# Patient Record
Sex: Male | Born: 1950
Health system: Southern US, Community
[De-identification: ages and names within clinical notes are randomized; demographics above are authoritative.]

## PROBLEM LIST (undated history)

## (undated) DIAGNOSIS — F329 Major depressive disorder, single episode, unspecified: Secondary | ICD-10-CM

## (undated) DIAGNOSIS — E079 Disorder of thyroid, unspecified: Secondary | ICD-10-CM

## (undated) DIAGNOSIS — I10 Essential (primary) hypertension: Secondary | ICD-10-CM

## (undated) DIAGNOSIS — I2510 Atherosclerotic heart disease of native coronary artery without angina pectoris: Secondary | ICD-10-CM

## (undated) DIAGNOSIS — K429 Umbilical hernia without obstruction or gangrene: Secondary | ICD-10-CM

## (undated) DIAGNOSIS — U071 COVID-19: Secondary | ICD-10-CM

## (undated) DIAGNOSIS — E785 Hyperlipidemia, unspecified: Secondary | ICD-10-CM

## (undated) DIAGNOSIS — F32A Depression, unspecified: Secondary | ICD-10-CM

## (undated) HISTORY — PX: TONSILLECTOMY: SUR1361

## (undated) HISTORY — PX: CATARACT EXTRACTION: SUR2

## (undated) HISTORY — DX: Hyperlipidemia, unspecified: E78.5

## (undated) HISTORY — DX: Umbilical hernia without obstruction or gangrene: K42.9

---

## 2015-04-23 DIAGNOSIS — Z23 Encounter for immunization: Secondary | ICD-10-CM | POA: Diagnosis not present

## 2015-08-27 DIAGNOSIS — Z0000 Encounter for general adult medical examination without abnormal findings: Secondary | ICD-10-CM | POA: Diagnosis not present

## 2015-08-27 DIAGNOSIS — Z125 Encounter for screening for malignant neoplasm of prostate: Secondary | ICD-10-CM | POA: Diagnosis not present

## 2015-08-27 DIAGNOSIS — R7301 Impaired fasting glucose: Secondary | ICD-10-CM | POA: Diagnosis not present

## 2015-08-27 DIAGNOSIS — E039 Hypothyroidism, unspecified: Secondary | ICD-10-CM | POA: Diagnosis not present

## 2015-08-27 DIAGNOSIS — E784 Other hyperlipidemia: Secondary | ICD-10-CM | POA: Diagnosis not present

## 2015-09-03 DIAGNOSIS — Z0000 Encounter for general adult medical examination without abnormal findings: Secondary | ICD-10-CM | POA: Diagnosis not present

## 2015-09-03 DIAGNOSIS — J45909 Unspecified asthma, uncomplicated: Secondary | ICD-10-CM | POA: Diagnosis not present

## 2015-09-03 DIAGNOSIS — Z8371 Family history of colonic polyps: Secondary | ICD-10-CM | POA: Diagnosis not present

## 2015-09-03 DIAGNOSIS — R69 Illness, unspecified: Secondary | ICD-10-CM | POA: Diagnosis not present

## 2015-09-03 DIAGNOSIS — E784 Other hyperlipidemia: Secondary | ICD-10-CM | POA: Diagnosis not present

## 2015-09-03 DIAGNOSIS — E039 Hypothyroidism, unspecified: Secondary | ICD-10-CM | POA: Diagnosis not present

## 2015-09-03 DIAGNOSIS — R5383 Other fatigue: Secondary | ICD-10-CM | POA: Diagnosis not present

## 2015-09-03 DIAGNOSIS — M1990 Unspecified osteoarthritis, unspecified site: Secondary | ICD-10-CM | POA: Diagnosis not present

## 2015-09-03 DIAGNOSIS — I160 Hypertensive urgency: Secondary | ICD-10-CM | POA: Diagnosis not present

## 2015-09-03 DIAGNOSIS — R7301 Impaired fasting glucose: Secondary | ICD-10-CM | POA: Diagnosis not present

## 2015-09-10 DIAGNOSIS — Z1212 Encounter for screening for malignant neoplasm of rectum: Secondary | ICD-10-CM | POA: Diagnosis not present

## 2015-09-11 DIAGNOSIS — Z6830 Body mass index (BMI) 30.0-30.9, adult: Secondary | ICD-10-CM | POA: Diagnosis not present

## 2015-09-11 DIAGNOSIS — I160 Hypertensive urgency: Secondary | ICD-10-CM | POA: Diagnosis not present

## 2015-10-09 DIAGNOSIS — R69 Illness, unspecified: Secondary | ICD-10-CM | POA: Diagnosis not present

## 2015-12-25 DIAGNOSIS — R5383 Other fatigue: Secondary | ICD-10-CM | POA: Diagnosis not present

## 2016-01-01 DIAGNOSIS — R7301 Impaired fasting glucose: Secondary | ICD-10-CM | POA: Diagnosis not present

## 2016-01-01 DIAGNOSIS — I160 Hypertensive urgency: Secondary | ICD-10-CM | POA: Diagnosis not present

## 2016-01-01 DIAGNOSIS — E038 Other specified hypothyroidism: Secondary | ICD-10-CM | POA: Diagnosis not present

## 2016-01-01 DIAGNOSIS — R69 Illness, unspecified: Secondary | ICD-10-CM | POA: Diagnosis not present

## 2016-03-02 DIAGNOSIS — H279 Unspecified disorder of lens: Secondary | ICD-10-CM | POA: Diagnosis not present

## 2016-04-03 DIAGNOSIS — Z23 Encounter for immunization: Secondary | ICD-10-CM | POA: Diagnosis not present

## 2016-08-17 DIAGNOSIS — H25041 Posterior subcapsular polar age-related cataract, right eye: Secondary | ICD-10-CM | POA: Diagnosis not present

## 2016-08-17 DIAGNOSIS — H5203 Hypermetropia, bilateral: Secondary | ICD-10-CM | POA: Diagnosis not present

## 2016-08-17 DIAGNOSIS — H524 Presbyopia: Secondary | ICD-10-CM | POA: Diagnosis not present

## 2016-08-17 DIAGNOSIS — H52223 Regular astigmatism, bilateral: Secondary | ICD-10-CM | POA: Diagnosis not present

## 2016-09-03 DIAGNOSIS — Z0000 Encounter for general adult medical examination without abnormal findings: Secondary | ICD-10-CM | POA: Diagnosis not present

## 2016-09-03 DIAGNOSIS — Z125 Encounter for screening for malignant neoplasm of prostate: Secondary | ICD-10-CM | POA: Diagnosis not present

## 2016-09-03 DIAGNOSIS — E038 Other specified hypothyroidism: Secondary | ICD-10-CM | POA: Diagnosis not present

## 2016-09-03 DIAGNOSIS — E784 Other hyperlipidemia: Secondary | ICD-10-CM | POA: Diagnosis not present

## 2016-09-03 DIAGNOSIS — R7301 Impaired fasting glucose: Secondary | ICD-10-CM | POA: Diagnosis not present

## 2016-09-10 DIAGNOSIS — M1990 Unspecified osteoarthritis, unspecified site: Secondary | ICD-10-CM | POA: Diagnosis not present

## 2016-09-10 DIAGNOSIS — R7301 Impaired fasting glucose: Secondary | ICD-10-CM | POA: Diagnosis not present

## 2016-09-10 DIAGNOSIS — J45909 Unspecified asthma, uncomplicated: Secondary | ICD-10-CM | POA: Diagnosis not present

## 2016-09-10 DIAGNOSIS — I10 Essential (primary) hypertension: Secondary | ICD-10-CM | POA: Diagnosis not present

## 2016-09-10 DIAGNOSIS — Z8371 Family history of colonic polyps: Secondary | ICD-10-CM | POA: Diagnosis not present

## 2016-09-10 DIAGNOSIS — E038 Other specified hypothyroidism: Secondary | ICD-10-CM | POA: Diagnosis not present

## 2016-09-10 DIAGNOSIS — R69 Illness, unspecified: Secondary | ICD-10-CM | POA: Diagnosis not present

## 2016-09-10 DIAGNOSIS — Z6830 Body mass index (BMI) 30.0-30.9, adult: Secondary | ICD-10-CM | POA: Diagnosis not present

## 2016-09-10 DIAGNOSIS — Z0000 Encounter for general adult medical examination without abnormal findings: Secondary | ICD-10-CM | POA: Diagnosis not present

## 2016-09-10 DIAGNOSIS — E784 Other hyperlipidemia: Secondary | ICD-10-CM | POA: Diagnosis not present

## 2016-09-14 DIAGNOSIS — Z1212 Encounter for screening for malignant neoplasm of rectum: Secondary | ICD-10-CM | POA: Diagnosis not present

## 2016-10-19 DIAGNOSIS — H02839 Dermatochalasis of unspecified eye, unspecified eyelid: Secondary | ICD-10-CM | POA: Diagnosis not present

## 2016-10-19 DIAGNOSIS — H25013 Cortical age-related cataract, bilateral: Secondary | ICD-10-CM | POA: Diagnosis not present

## 2016-10-19 DIAGNOSIS — H2513 Age-related nuclear cataract, bilateral: Secondary | ICD-10-CM | POA: Diagnosis not present

## 2016-10-19 DIAGNOSIS — H2511 Age-related nuclear cataract, right eye: Secondary | ICD-10-CM | POA: Diagnosis not present

## 2016-10-19 DIAGNOSIS — H25043 Posterior subcapsular polar age-related cataract, bilateral: Secondary | ICD-10-CM | POA: Diagnosis not present

## 2016-12-06 DIAGNOSIS — H2511 Age-related nuclear cataract, right eye: Secondary | ICD-10-CM | POA: Diagnosis not present

## 2016-12-07 DIAGNOSIS — H2512 Age-related nuclear cataract, left eye: Secondary | ICD-10-CM | POA: Diagnosis not present

## 2016-12-14 DIAGNOSIS — I10 Essential (primary) hypertension: Secondary | ICD-10-CM | POA: Diagnosis not present

## 2016-12-14 DIAGNOSIS — E038 Other specified hypothyroidism: Secondary | ICD-10-CM | POA: Diagnosis not present

## 2016-12-14 DIAGNOSIS — Z6831 Body mass index (BMI) 31.0-31.9, adult: Secondary | ICD-10-CM | POA: Diagnosis not present

## 2016-12-16 DIAGNOSIS — R69 Illness, unspecified: Secondary | ICD-10-CM | POA: Diagnosis not present

## 2016-12-27 DIAGNOSIS — H2512 Age-related nuclear cataract, left eye: Secondary | ICD-10-CM | POA: Diagnosis not present

## 2017-02-01 DIAGNOSIS — Z961 Presence of intraocular lens: Secondary | ICD-10-CM | POA: Diagnosis not present

## 2017-02-01 DIAGNOSIS — Z0101 Encounter for examination of eyes and vision with abnormal findings: Secondary | ICD-10-CM | POA: Diagnosis not present

## 2017-03-19 DIAGNOSIS — Z23 Encounter for immunization: Secondary | ICD-10-CM | POA: Diagnosis not present

## 2017-06-21 DIAGNOSIS — R69 Illness, unspecified: Secondary | ICD-10-CM | POA: Diagnosis not present

## 2017-08-02 DIAGNOSIS — H18413 Arcus senilis, bilateral: Secondary | ICD-10-CM | POA: Diagnosis not present

## 2017-08-02 DIAGNOSIS — Z961 Presence of intraocular lens: Secondary | ICD-10-CM | POA: Diagnosis not present

## 2017-08-02 DIAGNOSIS — I10 Essential (primary) hypertension: Secondary | ICD-10-CM | POA: Diagnosis not present

## 2017-08-02 DIAGNOSIS — H02839 Dermatochalasis of unspecified eye, unspecified eyelid: Secondary | ICD-10-CM | POA: Diagnosis not present

## 2017-09-19 DIAGNOSIS — Z125 Encounter for screening for malignant neoplasm of prostate: Secondary | ICD-10-CM | POA: Diagnosis not present

## 2017-09-19 DIAGNOSIS — R7301 Impaired fasting glucose: Secondary | ICD-10-CM | POA: Diagnosis not present

## 2017-09-19 DIAGNOSIS — E038 Other specified hypothyroidism: Secondary | ICD-10-CM | POA: Diagnosis not present

## 2017-09-19 DIAGNOSIS — E7849 Other hyperlipidemia: Secondary | ICD-10-CM | POA: Diagnosis not present

## 2017-09-19 DIAGNOSIS — I10 Essential (primary) hypertension: Secondary | ICD-10-CM | POA: Diagnosis not present

## 2017-09-19 DIAGNOSIS — R82998 Other abnormal findings in urine: Secondary | ICD-10-CM | POA: Diagnosis not present

## 2017-09-26 DIAGNOSIS — M1990 Unspecified osteoarthritis, unspecified site: Secondary | ICD-10-CM | POA: Diagnosis not present

## 2017-09-26 DIAGNOSIS — I10 Essential (primary) hypertension: Secondary | ICD-10-CM | POA: Diagnosis not present

## 2017-09-26 DIAGNOSIS — E038 Other specified hypothyroidism: Secondary | ICD-10-CM | POA: Diagnosis not present

## 2017-09-26 DIAGNOSIS — Z0000 Encounter for general adult medical examination without abnormal findings: Secondary | ICD-10-CM | POA: Diagnosis not present

## 2017-09-26 DIAGNOSIS — N183 Chronic kidney disease, stage 3 (moderate): Secondary | ICD-10-CM | POA: Diagnosis not present

## 2017-09-26 DIAGNOSIS — R69 Illness, unspecified: Secondary | ICD-10-CM | POA: Diagnosis not present

## 2017-09-26 DIAGNOSIS — E7849 Other hyperlipidemia: Secondary | ICD-10-CM | POA: Diagnosis not present

## 2017-09-26 DIAGNOSIS — M62830 Muscle spasm of back: Secondary | ICD-10-CM | POA: Diagnosis not present

## 2017-09-26 DIAGNOSIS — Z8371 Family history of colonic polyps: Secondary | ICD-10-CM | POA: Diagnosis not present

## 2017-09-26 DIAGNOSIS — R7301 Impaired fasting glucose: Secondary | ICD-10-CM | POA: Diagnosis not present

## 2017-10-03 DIAGNOSIS — Z1212 Encounter for screening for malignant neoplasm of rectum: Secondary | ICD-10-CM | POA: Diagnosis not present

## 2017-11-01 DIAGNOSIS — Z6830 Body mass index (BMI) 30.0-30.9, adult: Secondary | ICD-10-CM | POA: Diagnosis not present

## 2017-11-01 DIAGNOSIS — E039 Hypothyroidism, unspecified: Secondary | ICD-10-CM | POA: Diagnosis not present

## 2017-11-01 DIAGNOSIS — I10 Essential (primary) hypertension: Secondary | ICD-10-CM | POA: Diagnosis not present

## 2017-11-01 DIAGNOSIS — N183 Chronic kidney disease, stage 3 (moderate): Secondary | ICD-10-CM | POA: Diagnosis not present

## 2017-11-18 ENCOUNTER — Emergency Department
Admission: EM | Admit: 2017-11-18 | Discharge: 2017-11-18 | Disposition: A | Discharge status: Home / Self Care | Payer: Medicare HMO | Source: Home / Self Care | Attending: Family Medicine | Admitting: Family Medicine

## 2017-11-18 ENCOUNTER — Encounter: Payer: Self-pay | Admitting: *Deleted

## 2017-11-18 ENCOUNTER — Other Ambulatory Visit: Payer: Self-pay

## 2017-11-18 DIAGNOSIS — S30863A Insect bite (nonvenomous) of scrotum and testes, initial encounter: Secondary | ICD-10-CM | POA: Diagnosis not present

## 2017-11-18 DIAGNOSIS — W57XXXA Bitten or stung by nonvenomous insect and other nonvenomous arthropods, initial encounter: Secondary | ICD-10-CM

## 2017-11-18 DIAGNOSIS — R21 Rash and other nonspecific skin eruption: Secondary | ICD-10-CM

## 2017-11-18 HISTORY — DX: Essential (primary) hypertension: I10

## 2017-11-18 HISTORY — DX: Disorder of thyroid, unspecified: E07.9

## 2017-11-18 HISTORY — DX: Depression, unspecified: F32.A

## 2017-11-18 HISTORY — DX: Major depressive disorder, single episode, unspecified: F32.9

## 2017-11-18 MED ORDER — TRIAMCINOLONE ACETONIDE 0.1 % EX CREA: 1.0000 "application " | TOPICAL_CREAM | Freq: Two times a day (BID) | CUTANEOUS | 0 refills | Status: DC

## 2017-11-18 MED ORDER — DOXYCYCLINE HYCLATE 100 MG PO CAPS: 100.0000 mg | ORAL_CAPSULE | Freq: Two times a day (BID) | ORAL | 0 refills | Status: AC

## 2017-11-18 NOTE — Discharge Instructions (Signed)
°  Please take the antibiotic as prescribed.  If you are not improving in 1 week, please be reevaluated by your family medicine provider.  You may use the triamcinolone cream on your arm and side to help with itching but I would avoid putting it on your scrotum due to the tissue being so delicate.

## 2017-11-18 NOTE — ED Provider Notes (Signed)
[name]    CSN: [id number] Arrival date & time: 11/18/17  1119     History   Chief Complaint Chief Complaint  Patient presents with  . Insect Bite    HPI Bryan Thompson is a 67 y.o. male.   HPI  Bryan Thompson is a 67 y.o. male presenting to UC with wife c/o right sided scrotal pain, swelling and redness that he noticed last night after removing a deer tick from his scrotum. He believes the tick was there for 24-48 hours. He works outside a lot and initially noticed a red itchy rash to his Left side chest wall and then Left forearm about 2 days ago.  He initially thought the scrotal symptoms were due to the rash spreading but then found the tick.  Denies fever, chills, n/v/d, HA, or joint pain but wife encourages pt to start doxycycline for tick illness.     Past Medical History:  Diagnosis Date  . Depression   . Hypertension   . Thyroid disease     There are no active problems to display for this patient.   Past Surgical History:  Procedure Laterality Date  . CATARACT EXTRACTION    . TONSILLECTOMY         Home Medications    Prior to Admission medications   Medication Sig Start Date End Date Taking? Authorizing Provider  irbesartan (AVAPRO) 150 MG tablet Take 150 mg by mouth daily.   Yes [provider]  levothyroxine (SYNTHROID, LEVOTHROID) 150 MCG tablet Take 150 mcg by mouth daily before breakfast.   Yes [provider]  Multiple Vitamins-Minerals (CENTRUM SILVER PO) Take by mouth.   Yes [provider]  sertraline (ZOLOFT) 50 MG tablet Take 50 mg by mouth daily.   Yes [provider]  vitamin B-12 (CYANOCOBALAMIN) 1000 MCG tablet Take 1,000 mcg by mouth daily.   Yes [provider]  doxycycline (VIBRAMYCIN) 100 MG capsule Take 1 capsule (100 mg total) by mouth 2 (two) times daily for 14 days. 11/18/17 12/02/17  Noe Gens, PA-C  triamcinolone cream (KENALOG) 0.1 % Apply 1 application topically 2  (two) times daily. To rash on arm and side as needed for itching 11/18/17   Noe Gens, PA-C    Family History Family History  Problem Relation Age of Onset  . Cancer Mother        breast CA  . Heart disease Mother   . Hypertension Father   . Cancer Father        colon CA    Social History Social History   Tobacco Use  . Smoking status: Never Smoker  . Smokeless tobacco: Never Used  Substance Use Topics  . Alcohol use: Not Currently    Frequency: Never  . Drug use: Never     Allergies   Fish allergy and Prednisone   Review of Systems Review of Systems  Constitutional: Negative for chills and fever.  Gastrointestinal: Negative for diarrhea, nausea and vomiting.  Genitourinary: Positive for scrotal swelling. Negative for discharge, penile pain and penile swelling.  Musculoskeletal: Negative for arthralgias, joint swelling and myalgias.  Skin: Positive for color change and rash. Negative for wound.     Physical Exam Triage Vital Signs ED Triage Vitals [11/18/17 1137]  Enc Vitals Group     BP 124/70     Pulse Rate 71     Resp 16     Temp 97.6 F (36.4 C)     Temp Source  Oral     SpO2 98 %     Weight 218 lb (98.9 kg)     Height      Head Circumference      Peak Flow      Pain Score 5     Pain Loc      Pain Edu?      Excl. in [place]?    No data found.  Updated Vital Signs BP 124/70 (BP Location: Right Arm)   Pulse 71   Temp 97.6 F (36.4 C) (Oral)   Resp 16   Wt 218 lb (98.9 kg)   SpO2 98%   Visual Acuity Right Eye Distance:   Left Eye Distance:   Bilateral Distance:    Right Eye Near:   Left Eye Near:    Bilateral Near:     Physical Exam  Constitutional: He is oriented to person, place, and time. He appears well-developed and well-nourished. No distress.  HENT:  Head: Normocephalic and atraumatic.  Mouth/Throat: Oropharynx is clear and moist.  Eyes: EOM are normal.  Neck: Normal range of motion.  Cardiovascular: Normal rate.   Pulses:      Radial pulses are 2+ on the right side, and 2+ on the left side.  Pulmonary/Chest: Effort normal. No respiratory distress.  Musculoskeletal: Normal range of motion.  Neurological: He is alert and oriented to person, place, and time.  Skin: Skin is warm and dry. Capillary refill takes less than 2 seconds. Rash noted. He is not diaphoretic. There is erythema.     Left side chest wall and Left forearm: erythematous papular rash. Non-tender. No bleeding or discharge. Right side scrotum and groin: erythema, mild edema, mild tenderness. Pinpoint scab on Right side of scrotum- area of reported tick removal.  Psychiatric: He has a normal mood and affect. His behavior is normal.  Nursing note and vitals reviewed.    UC Treatments / Results  Labs (all labs ordered are listed, but only abnormal results are displayed) Labs Reviewed - No data to display  EKG None  Radiology No results found.  Procedures Procedures (including critical care time)  Medications Ordered in UC Medications - No data to display  Initial Impression / Assessment and Plan / UC Course  I have reviewed the triage vital signs and the nursing notes.  Pertinent labs & imaging results that were available during my care of the patient were reviewed by me and considered in my medical decision making (see chart for details).     Rash on Left side and Left arm most c/w contact dermatitis, likely from working outside. Erythema, swelling and tenderness on Right side scrotum and groin concerning for early cellulitis. Will start on doxycycline 100mg  BID for 2 weeks  Home care instructions provided.   Final Clinical Impressions(s) / UC Diagnoses   Final diagnoses:  Tick bite of scrotum, initial encounter  Rash and nonspecific skin eruption     Discharge Instructions      Please take the antibiotic as prescribed.  If you are not improving in 1 week, please be reevaluated by your family medicine  provider.  You may use the triamcinolone cream on your arm and side to help with itching but I would avoid putting it on your scrotum due to the tissue being so delicate.     ED Prescriptions    Medication Sig Dispense Auth. Provider   doxycycline (VIBRAMYCIN) 100 MG capsule Take 1 capsule (100 mg total) by mouth 2 (two) times daily for  14 days. 28 capsule Leeroy Cha O, PA-C   triamcinolone cream (KENALOG) 0.1 % Apply 1 application topically 2 (two) times daily. To rash on arm and side as needed for itching 30 g Noe Gens, PA-C     Controlled Substance Prescriptions [place] Controlled Substance Registry consulted? Not Applicable   Tyrell Antonio 11/18/17 1418

## 2017-11-18 NOTE — ED Triage Notes (Signed)
Patient c/o pulling a tick off of his scrotum lat night that he believes was there for 24-48 hours. C/o lump at the site, swelling of the scrotum am that travels up his right groin. Also has a rash to his LFA and left side/ribs. Reports he feels well, no aches, nausea HA, etc.

## 2017-11-20 ENCOUNTER — Telehealth: Payer: Self-pay

## 2017-11-20 NOTE — Telephone Encounter (Signed)
Feeling better.  Will follow up as needed.

## 2017-12-20 DIAGNOSIS — R69 Illness, unspecified: Secondary | ICD-10-CM | POA: Diagnosis not present

## 2018-03-28 DIAGNOSIS — Z6831 Body mass index (BMI) 31.0-31.9, adult: Secondary | ICD-10-CM | POA: Diagnosis not present

## 2018-03-28 DIAGNOSIS — I10 Essential (primary) hypertension: Secondary | ICD-10-CM | POA: Diagnosis not present

## 2018-03-28 DIAGNOSIS — N183 Chronic kidney disease, stage 3 (moderate): Secondary | ICD-10-CM | POA: Diagnosis not present

## 2018-03-28 DIAGNOSIS — Z23 Encounter for immunization: Secondary | ICD-10-CM | POA: Diagnosis not present

## 2018-03-28 DIAGNOSIS — J45909 Unspecified asthma, uncomplicated: Secondary | ICD-10-CM | POA: Diagnosis not present

## 2018-03-28 DIAGNOSIS — R69 Illness, unspecified: Secondary | ICD-10-CM | POA: Diagnosis not present

## 2018-03-28 DIAGNOSIS — E038 Other specified hypothyroidism: Secondary | ICD-10-CM | POA: Diagnosis not present

## 2018-03-28 DIAGNOSIS — R7301 Impaired fasting glucose: Secondary | ICD-10-CM | POA: Diagnosis not present

## 2018-05-31 DIAGNOSIS — N183 Chronic kidney disease, stage 3 (moderate): Secondary | ICD-10-CM | POA: Diagnosis not present

## 2018-05-31 DIAGNOSIS — I10 Essential (primary) hypertension: Secondary | ICD-10-CM | POA: Diagnosis not present

## 2018-05-31 DIAGNOSIS — Z6830 Body mass index (BMI) 30.0-30.9, adult: Secondary | ICD-10-CM | POA: Diagnosis not present

## 2018-06-28 DIAGNOSIS — R69 Illness, unspecified: Secondary | ICD-10-CM | POA: Diagnosis not present

## 2018-07-12 DIAGNOSIS — I10 Essential (primary) hypertension: Secondary | ICD-10-CM | POA: Diagnosis not present

## 2018-07-12 DIAGNOSIS — N183 Chronic kidney disease, stage 3 (moderate): Secondary | ICD-10-CM | POA: Diagnosis not present

## 2018-07-12 DIAGNOSIS — Z6831 Body mass index (BMI) 31.0-31.9, adult: Secondary | ICD-10-CM | POA: Diagnosis not present

## 2018-09-27 DIAGNOSIS — E038 Other specified hypothyroidism: Secondary | ICD-10-CM | POA: Diagnosis not present

## 2018-09-27 DIAGNOSIS — R7301 Impaired fasting glucose: Secondary | ICD-10-CM | POA: Diagnosis not present

## 2018-09-27 DIAGNOSIS — I10 Essential (primary) hypertension: Secondary | ICD-10-CM | POA: Diagnosis not present

## 2018-09-27 DIAGNOSIS — E7849 Other hyperlipidemia: Secondary | ICD-10-CM | POA: Diagnosis not present

## 2018-09-27 DIAGNOSIS — R82998 Other abnormal findings in urine: Secondary | ICD-10-CM | POA: Diagnosis not present

## 2018-10-04 DIAGNOSIS — Z0000 Encounter for general adult medical examination without abnormal findings: Secondary | ICD-10-CM | POA: Diagnosis not present

## 2018-10-04 DIAGNOSIS — J45909 Unspecified asthma, uncomplicated: Secondary | ICD-10-CM | POA: Diagnosis not present

## 2018-10-04 DIAGNOSIS — R69 Illness, unspecified: Secondary | ICD-10-CM | POA: Diagnosis not present

## 2018-10-04 DIAGNOSIS — E039 Hypothyroidism, unspecified: Secondary | ICD-10-CM | POA: Diagnosis not present

## 2018-10-04 DIAGNOSIS — E785 Hyperlipidemia, unspecified: Secondary | ICD-10-CM | POA: Diagnosis not present

## 2018-10-04 DIAGNOSIS — M1990 Unspecified osteoarthritis, unspecified site: Secondary | ICD-10-CM | POA: Diagnosis not present

## 2018-10-04 DIAGNOSIS — Z1331 Encounter for screening for depression: Secondary | ICD-10-CM | POA: Diagnosis not present

## 2018-10-04 DIAGNOSIS — I129 Hypertensive chronic kidney disease with stage 1 through stage 4 chronic kidney disease, or unspecified chronic kidney disease: Secondary | ICD-10-CM | POA: Diagnosis not present

## 2018-10-04 DIAGNOSIS — R7301 Impaired fasting glucose: Secondary | ICD-10-CM | POA: Diagnosis not present

## 2018-10-04 DIAGNOSIS — N183 Chronic kidney disease, stage 3 (moderate): Secondary | ICD-10-CM | POA: Diagnosis not present

## 2018-11-25 ENCOUNTER — Other Ambulatory Visit: Payer: Self-pay

## 2018-11-25 ENCOUNTER — Emergency Department
Admission: EM | Admit: 2018-11-25 | Discharge: 2018-11-25 | Disposition: A | Discharge status: Home / Self Care | Payer: Medicare HMO | Source: Home / Self Care | Attending: Family Medicine | Admitting: Family Medicine

## 2018-11-25 DIAGNOSIS — B9789 Other viral agents as the cause of diseases classified elsewhere: Secondary | ICD-10-CM

## 2018-11-25 DIAGNOSIS — R21 Rash and other nonspecific skin eruption: Secondary | ICD-10-CM

## 2018-11-25 DIAGNOSIS — J069 Acute upper respiratory infection, unspecified: Secondary | ICD-10-CM

## 2018-11-25 DIAGNOSIS — W57XXXS Bitten or stung by nonvenomous insect and other nonvenomous arthropods, sequela: Secondary | ICD-10-CM | POA: Diagnosis not present

## 2018-11-25 MED ORDER — DOXYCYCLINE HYCLATE 100 MG PO CAPS: 100.0000 mg | ORAL_CAPSULE | Freq: Two times a day (BID) | ORAL | 0 refills | Status: DC

## 2018-11-25 NOTE — Discharge Instructions (Addendum)
Take plain guaifenesin (1200mg  extended release tabs such as Mucinex) twice daily, with plenty of water, for cough and congestion.  Get adequate rest.   May use Afrin nasal spray (or generic oxymetazoline) each morning for about 5 days and then discontinue.  Also recommend using saline nasal spray several times daily and saline nasal irrigation (AYR is a common brand).  Use Flonase nasal spray each morning after using Afrin nasal spray and saline nasal irrigation. Try warm salt water gargles for sore throat.  Stop all antihistamines for now, and other non-prescription cough/cold preparations. May take Delsym Cough Suppressant at bedtime for nighttime cough.  May use albuterol inhaler as prescribed.   If symptoms become significantly worse during the night or over the weekend, proceed to the local emergency room.

## 2018-11-25 NOTE — ED Provider Notes (Signed)
[name]    CSN: [id number] Arrival date & time: 11/25/18  3244     History   Chief Complaint Chief Complaint  Patient presents with  . Rash  . Insect Bite    tick    HPI Darcell Sabino is a 68 y.o. male.   Patient presents with two complaints: 1)  He had several tick bites in late May and early June.  The last tick that he removed (not engorged) was a [place] star tick.  Since then he has had an intermittent rash, now resolved, and headache/myalgias. 2)  Six days ago he developed a productive cough, increased headache, nasal congestion, mild sore throat, fatigue and myalgias.  He denies pleuritic pain or shortness of breath.  He denies recent changes in taste or smell. He has a past history of asthma and pneumonia.  The history is provided by the patient and the spouse.    Past Medical History:  Diagnosis Date  . Depression   . Hypertension   . Thyroid disease     Active problems:  Hypothyroid, hypertension, and depression   Past Surgical History:  Procedure Laterality Date  . CATARACT EXTRACTION    . TONSILLECTOMY         Home Medications    Prior to Admission medications   Medication Sig Start Date End Date Taking? Authorizing Provider  doxycycline (VIBRAMYCIN) 100 MG capsule Take 1 capsule (100 mg total) by mouth 2 (two) times daily. Take with food. 11/25/18   Kandra Nicolas, MD  irbesartan (AVAPRO) 150 MG tablet Take 150 mg by mouth daily.    [provider]  levothyroxine (SYNTHROID, LEVOTHROID) 150 MCG tablet Take 150 mcg by mouth daily before breakfast.    [provider]  Multiple Vitamins-Minerals (CENTRUM SILVER PO) Take by mouth.    [provider]  sertraline (ZOLOFT) 50 MG tablet Take 50 mg by mouth daily.    [provider]  triamcinolone cream (KENALOG) 0.1 % Apply 1 application topically 2 (two) times daily. To rash on arm and side as needed for itching 11/18/17   Noe Gens, PA-C  vitamin B-12  (CYANOCOBALAMIN) 1000 MCG tablet Take 1,000 mcg by mouth daily.    [provider]    Family History Family History  Problem Relation Age of Onset  . Cancer Mother        breast CA  . Heart disease Mother   . Hypertension Father   . Cancer Father        colon CA    Social History Social History   Tobacco Use  . Smoking status: Never Smoker  . Smokeless tobacco: Never Used  Substance Use Topics  . Alcohol use: Not Currently    Frequency: Never  . Drug use: Never     Allergies   Fish allergy and Prednisone   Review of Systems Review of Systems + sore throat + cough No pleuritic pain No wheezing + nasal congestion + post-nasal drainage No sinus pain/pressure No itchy/red eyes No earache No hemoptysis No SOB No fever/chills No nausea No vomiting No abdominal pain No diarrhea No urinary symptoms No skin rash + fatigue + myalgias + headache Used OTC meds without relief   Physical Exam Triage Vital Signs ED Triage Vitals  Enc Vitals Group     BP 11/25/18 1006 131/77     Pulse Rate 11/25/18 1006 86     Resp 11/25/18 1006 18     Temp 11/25/18 1006  97.6 F (36.4 C)     Temp Source 11/25/18 1006 Oral     SpO2 11/25/18 1006 97 %     Weight 11/25/18 1007 222 lb (100.7 kg)     Height 11/25/18 1007 5\' 10"  (1.778 m)     Head Circumference --      Peak Flow --      Pain Score 11/25/18 1007 0     Pain Loc --      Pain Edu? --      Excl. in [place]? --    No data found.  Updated Vital Signs BP 131/77 (BP Location: Right Arm)   Pulse 86   Temp 97.6 F (36.4 C) (Oral)   Resp 18   Ht 5\' 10"  (1.778 m)   Wt 100.7 kg   SpO2 97%   BMI 31.85 kg/m   Visual Acuity Right Eye Distance:   Left Eye Distance:   Bilateral Distance:    Right Eye Near:   Left Eye Near:    Bilateral Near:     Physical Exam Nursing notes and Vital Signs reviewed. Appearance:  Patient appears stated age, and in no acute distress Eyes:  Pupils are equal, round, and  reactive to light and accomodation.  Extraocular movement is intact.  Conjunctivae are not inflamed  Ears:  Canals normal.  Tympanic membranes normal.  Nose:  Mildly congested turbinates.  No sinus tenderness. Pharynx:  Normal Neck:  Supple.  Enlarged posterior/lateral nodes are palpated bilaterally, tender to palpation on the left.   Lungs:  Clear to auscultation.  Breath sounds are equal.  Moving air well. Heart:  Regular rate and rhythm without murmurs, rubs, or gallops.  Abdomen:  Nontender without masses or hepatosplenomegaly.  Bowel sounds are present.  No CVA or flank tenderness.  Extremities:  No edema.  Skin:  No rash present.    UC Treatments / Results  Labs (all labs ordered are listed, but only abnormal results are displayed) [place] MTN SPOTTED FVR ABS PNL(IGG+IGM)    EKG   Radiology No results found.  Procedures Procedures (including critical care time)  Medications Ordered in UC Medications - No data to display  Initial Impression / Assessment and Plan / UC Course  I have reviewed the triage vital signs and the nursing notes.  Pertinent labs & imaging results that were available during my care of the patient were reviewed by me and considered in my medical decision making (see chart for details).    Suspect viral URI. Because of recent tick bite from a [place] tick, will begin empiric doxycycline. RMSF and Lyme Disease antibodies pending. Followup with Family Doctor if not improved in about 10 days.   Final Clinical Impressions(s) / UC Diagnoses   Final diagnoses:  Tick bite, sequela  Viral URI with cough     Discharge Instructions     Take plain guaifenesin (1200mg  extended release tabs such as Mucinex) twice daily, with plenty of water, for cough and congestion.  Get adequate rest.   May use Afrin nasal spray (or generic oxymetazoline) each morning for about 5 days and then discontinue.  Also recommend using  saline nasal spray several times daily and saline nasal irrigation (AYR is a common brand).  Use Flonase nasal spray each morning after using Afrin nasal spray and saline nasal irrigation. Try warm salt water gargles for sore throat.  Stop all antihistamines for now, and other non-prescription cough/cold preparations. May take  Delsym Cough Suppressant at bedtime for nighttime cough.  May use albuterol inhaler as prescribed.   If symptoms become significantly worse during the night or over the weekend, proceed to the local emergency room.      ED Prescriptions    Medication Sig Dispense Auth. Provider   doxycycline (VIBRAMYCIN) 100 MG capsule Take 1 capsule (100 mg total) by mouth 2 (two) times daily. Take with food. 20 capsule Kandra Nicolas, MD        Kandra Nicolas, MD 11/28/18 [phone]

## 2018-11-25 NOTE — ED Notes (Signed)
Attempted to draw blood in right hand.  Unsuccessful.  Pt will come to the lab on Monday to have labs drawn.

## 2018-11-25 NOTE — ED Triage Notes (Signed)
Pt c/o rash and headache/bodyaches since tick bites in late May and early June. Care taker says the last tick was a lon[place]ar tick. Took benedryl last night which helped with rash. Bodyaches have since subsided.

## 2018-11-27 DIAGNOSIS — J069 Acute upper respiratory infection, unspecified: Secondary | ICD-10-CM | POA: Diagnosis not present

## 2018-11-27 DIAGNOSIS — W57XXXS Bitten or stung by nonvenomous insect and other nonvenomous arthropods, sequela: Secondary | ICD-10-CM | POA: Diagnosis not present

## 2018-11-27 DIAGNOSIS — B9789 Other viral agents as the cause of diseases classified elsewhere: Secondary | ICD-10-CM | POA: Diagnosis not present

## 2018-11-28 ENCOUNTER — Telehealth: Payer: Self-pay | Admitting: *Deleted

## 2018-11-28 LAB — ROCKY MTN SPOTTED FVR ABS PNL(IGG+IGM)
RMSF IgG: NOT DETECTED
RMSF IgM: NOT DETECTED

## 2018-11-28 LAB — B. BURGDORFI ANTIBODIES: B burgdorferi Ab IgG+IgM: <0.9 index

## 2018-11-28 NOTE — Telephone Encounter (Signed)
Spoke to pt given lab results. He still c/o HA and body aches. Rash is improving. Advised him to continue ABT and call back or f/u with PCP if he fails to improve or worsens.pt verbalized understanding.

## 2018-12-11 DIAGNOSIS — N183 Chronic kidney disease, stage 3 (moderate): Secondary | ICD-10-CM | POA: Diagnosis not present

## 2018-12-11 DIAGNOSIS — L508 Other urticaria: Secondary | ICD-10-CM | POA: Diagnosis not present

## 2018-12-11 DIAGNOSIS — E039 Hypothyroidism, unspecified: Secondary | ICD-10-CM | POA: Diagnosis not present

## 2018-12-11 DIAGNOSIS — I129 Hypertensive chronic kidney disease with stage 1 through stage 4 chronic kidney disease, or unspecified chronic kidney disease: Secondary | ICD-10-CM | POA: Diagnosis not present

## 2019-02-09 DIAGNOSIS — Z0100 Encounter for examination of eyes and vision without abnormal findings: Secondary | ICD-10-CM | POA: Diagnosis not present

## 2019-04-05 DIAGNOSIS — Z23 Encounter for immunization: Secondary | ICD-10-CM | POA: Diagnosis not present

## 2019-08-28 DIAGNOSIS — R69 Illness, unspecified: Secondary | ICD-10-CM | POA: Diagnosis not present

## 2019-10-29 DIAGNOSIS — E7849 Other hyperlipidemia: Secondary | ICD-10-CM | POA: Diagnosis not present

## 2019-10-29 DIAGNOSIS — Z0000 Encounter for general adult medical examination without abnormal findings: Secondary | ICD-10-CM | POA: Diagnosis not present

## 2019-10-29 DIAGNOSIS — R7301 Impaired fasting glucose: Secondary | ICD-10-CM | POA: Diagnosis not present

## 2019-10-29 DIAGNOSIS — Z125 Encounter for screening for malignant neoplasm of prostate: Secondary | ICD-10-CM | POA: Diagnosis not present

## 2019-10-29 DIAGNOSIS — E038 Other specified hypothyroidism: Secondary | ICD-10-CM | POA: Diagnosis not present

## 2019-11-05 DIAGNOSIS — M1990 Unspecified osteoarthritis, unspecified site: Secondary | ICD-10-CM | POA: Diagnosis not present

## 2019-11-05 DIAGNOSIS — I129 Hypertensive chronic kidney disease with stage 1 through stage 4 chronic kidney disease, or unspecified chronic kidney disease: Secondary | ICD-10-CM | POA: Diagnosis not present

## 2019-11-05 DIAGNOSIS — Z0000 Encounter for general adult medical examination without abnormal findings: Secondary | ICD-10-CM | POA: Diagnosis not present

## 2019-11-05 DIAGNOSIS — J45909 Unspecified asthma, uncomplicated: Secondary | ICD-10-CM | POA: Diagnosis not present

## 2019-11-05 DIAGNOSIS — E785 Hyperlipidemia, unspecified: Secondary | ICD-10-CM | POA: Diagnosis not present

## 2019-11-05 DIAGNOSIS — E871 Hypo-osmolality and hyponatremia: Secondary | ICD-10-CM | POA: Diagnosis not present

## 2019-11-05 DIAGNOSIS — R69 Illness, unspecified: Secondary | ICD-10-CM | POA: Diagnosis not present

## 2019-11-05 DIAGNOSIS — R7301 Impaired fasting glucose: Secondary | ICD-10-CM | POA: Diagnosis not present

## 2019-11-05 DIAGNOSIS — Z1212 Encounter for screening for malignant neoplasm of rectum: Secondary | ICD-10-CM | POA: Diagnosis not present

## 2019-11-05 DIAGNOSIS — E039 Hypothyroidism, unspecified: Secondary | ICD-10-CM | POA: Diagnosis not present

## 2019-11-05 DIAGNOSIS — N1831 Chronic kidney disease, stage 3a: Secondary | ICD-10-CM | POA: Diagnosis not present

## 2019-11-05 DIAGNOSIS — Z1331 Encounter for screening for depression: Secondary | ICD-10-CM | POA: Diagnosis not present

## 2019-11-05 DIAGNOSIS — R82998 Other abnormal findings in urine: Secondary | ICD-10-CM | POA: Diagnosis not present

## 2020-01-31 DIAGNOSIS — I10 Essential (primary) hypertension: Secondary | ICD-10-CM | POA: Diagnosis not present

## 2020-01-31 DIAGNOSIS — E039 Hypothyroidism, unspecified: Secondary | ICD-10-CM | POA: Diagnosis not present

## 2020-02-06 DIAGNOSIS — R69 Illness, unspecified: Secondary | ICD-10-CM | POA: Diagnosis not present

## 2020-02-06 DIAGNOSIS — E039 Hypothyroidism, unspecified: Secondary | ICD-10-CM | POA: Diagnosis not present

## 2020-02-06 DIAGNOSIS — N1831 Chronic kidney disease, stage 3a: Secondary | ICD-10-CM | POA: Diagnosis not present

## 2020-02-06 DIAGNOSIS — I129 Hypertensive chronic kidney disease with stage 1 through stage 4 chronic kidney disease, or unspecified chronic kidney disease: Secondary | ICD-10-CM | POA: Diagnosis not present

## 2020-03-05 DIAGNOSIS — R69 Illness, unspecified: Secondary | ICD-10-CM | POA: Diagnosis not present

## 2020-03-15 DIAGNOSIS — Z23 Encounter for immunization: Secondary | ICD-10-CM | POA: Diagnosis not present

## 2020-06-05 DIAGNOSIS — H5203 Hypermetropia, bilateral: Secondary | ICD-10-CM | POA: Diagnosis not present

## 2020-06-05 DIAGNOSIS — H52209 Unspecified astigmatism, unspecified eye: Secondary | ICD-10-CM | POA: Diagnosis not present

## 2020-06-05 DIAGNOSIS — H524 Presbyopia: Secondary | ICD-10-CM | POA: Diagnosis not present

## 2020-12-01 ENCOUNTER — Other Ambulatory Visit: Payer: Self-pay

## 2020-12-01 ENCOUNTER — Emergency Department (INDEPENDENT_AMBULATORY_CARE_PROVIDER_SITE_OTHER): Payer: Medicare HMO

## 2020-12-01 ENCOUNTER — Emergency Department
Admission: RE | Admit: 2020-12-01 | Discharge: 2020-12-01 | Disposition: A | Discharge status: Home / Self Care | Payer: Medicare HMO | Source: Ambulatory Visit

## 2020-12-01 VITALS — BP 150/87 | HR 72 | Temp 98.4°F | Resp 16 | Ht 70.0 in | Wt 215.0 lb

## 2020-12-01 DIAGNOSIS — U071 COVID-19: Secondary | ICD-10-CM | POA: Diagnosis not present

## 2020-12-01 DIAGNOSIS — J029 Acute pharyngitis, unspecified: Secondary | ICD-10-CM

## 2020-12-01 DIAGNOSIS — R059 Cough, unspecified: Secondary | ICD-10-CM

## 2020-12-01 DIAGNOSIS — R0989 Other specified symptoms and signs involving the circulatory and respiratory systems: Secondary | ICD-10-CM | POA: Diagnosis not present

## 2020-12-01 DIAGNOSIS — J9811 Atelectasis: Secondary | ICD-10-CM | POA: Diagnosis not present

## 2020-12-01 HISTORY — DX: COVID-19: U07.1

## 2020-12-01 MED ORDER — BENZONATATE 100 MG PO CAPS: 100.0000 mg | ORAL_CAPSULE | Freq: Three times a day (TID) | ORAL | 0 refills | Status: DC

## 2020-12-01 MED ORDER — MOLNUPIRAVIR EUA 200MG CAPSULE: 4.0000 | ORAL_CAPSULE | Freq: Two times a day (BID) | ORAL | 0 refills | Status: AC

## 2020-12-01 NOTE — ED Triage Notes (Signed)
Patient here day 4 of feeling ill; did home covid test 3 days ago and it was positive. Has had fever of 101.4; cough, headache, congestion, body aches, sore throat with difficulty swallowing. Did have first 2 covid vaccinations.

## 2020-12-01 NOTE — ED Provider Notes (Signed)
[name]    CSN: [id number] Arrival date & time: 12/01/20  1200      History   Chief Complaint Chief Complaint  Patient presents with   Covid Positive    HPI Bryan Thompson is a 70 y.o. male.   Patient presents today with a 4-day history of symptoms.  Reports he is experiencing fever, cough, nasal congestion, sore throat, body aches, fatigue, malaise.  Denies any chest pain, shortness of breath, nausea, vomiting, dizziness, syncope.  He did take an at home COVID test that was positive the day after symptoms began.  He has tried several over-the-counter medications including Mucinex and Tylenol without improvement of symptoms.  Reports he has had COVID-19 vaccinations but has not had booster.  He denies any recent antibiotic use.  Reports he is generally healthy and denies any significant medical conditions including diabetes, heart disease, immunosuppression, malignancy.   Past Medical History:  Diagnosis Date   COVID-19    Depression    Hypertension    Thyroid disease     There are no problems to display for this patient.   Past Surgical History:  Procedure Laterality Date   CATARACT EXTRACTION     TONSILLECTOMY         Home Medications    Prior to Admission medications   Medication Sig Start Date End Date Taking? Authorizing Provider  benzonatate (TESSALON) 100 MG capsule Take 1 capsule (100 mg total) by mouth every 8 (eight) hours. 12/01/20  Yes Hakop Humbarger, Derry Skill, PA-C  molnupiravir EUA 200 mg CAPS Take 4 capsules (800 mg total) by mouth 2 (two) times daily for 5 days. 12/01/20 12/06/20 Yes Jameila Keeny K, PA-C  irbesartan (AVAPRO) 150 MG tablet Take 150 mg by mouth daily.    [provider]  levothyroxine (SYNTHROID, LEVOTHROID) 150 MCG tablet Take 150 mcg by mouth daily before breakfast.    [provider]  Multiple Vitamins-Minerals (CENTRUM SILVER PO) Take by mouth.    [provider]  sertraline (ZOLOFT) 50 MG tablet Take  50 mg by mouth daily.    [provider]  vitamin B-12 (CYANOCOBALAMIN) 1000 MCG tablet Take 1,000 mcg by mouth daily.    [provider]    Family History Family History  Problem Relation Age of Onset   Cancer Mother        breast CA   Heart disease Mother    Hypertension Father    Cancer Father        colon CA    Social History Social History   Tobacco Use   Smoking status: Never   Smokeless tobacco: Never  Vaping Use   Vaping Use: Never used  Substance Use Topics   Alcohol use: Not Currently   Drug use: Never     Allergies   Fish allergy and Prednisone   Review of Systems Review of Systems  Constitutional:  Positive for activity change, appetite change, fatigue and fever.  HENT:  Positive for congestion and sore throat. Negative for sinus pressure and sneezing.   Respiratory:  Positive for cough. Negative for shortness of breath.   Cardiovascular:  Negative for chest pain.  Gastrointestinal:  Negative for abdominal pain, diarrhea, nausea and vomiting.  Musculoskeletal:  Positive for arthralgias and myalgias.  Neurological:  Positive for headaches. Negative for dizziness and light-headedness.    Physical Exam Triage Vital Signs ED Triage Vitals  Enc Vitals Group     BP 12/01/20 1231 (!) 150/87  Pulse Rate 12/01/20 1231 72     Resp 12/01/20 1231 16     Temp 12/01/20 1231 98.4 F (36.9 C)     Temp Source 12/01/20 1231 Oral     SpO2 12/01/20 1231 94 %     Weight 12/01/20 1228 215 lb (97.5 kg)     Height 12/01/20 1228 5\' 10"  (1.778 m)     Head Circumference --      Peak Flow --      Pain Score 12/01/20 1228 7     Pain Loc --      Pain Edu? --      Excl. in [place]? --    No data found.  Updated Vital Signs BP (!) 150/87 (BP Location: Right Arm)   Pulse 72   Temp 98.4 F (36.9 C) (Oral)   Resp 16   Ht 5\' 10"  (1.778 m)   Wt 215 lb (97.5 kg)   SpO2 94%   BMI 30.85 kg/m   Visual Acuity Right Eye Distance:   Left Eye Distance:    Bilateral Distance:    Right Eye Near:   Left Eye Near:    Bilateral Near:     Physical Exam Vitals reviewed.  Constitutional:      General: He is awake.     Appearance: Normal appearance. He is normal weight. He is not ill-appearing.     Comments: Very pleasant male appears stated age in no acute distress sitting comfortably in exam room  HENT:     Head: Normocephalic and atraumatic.     Right Ear: Tympanic membrane, ear canal and external ear normal. Tympanic membrane is not erythematous or bulging.     Left Ear: Tympanic membrane, ear canal and external ear normal. Tympanic membrane is not erythematous or bulging.     Nose: Nose normal.     Mouth/Throat:     Pharynx: Uvula midline. Posterior oropharyngeal erythema present. No oropharyngeal exudate.     Tonsils: No tonsillar exudate or tonsillar abscesses.  Cardiovascular:     Rate and Rhythm: Normal rate and regular rhythm.     Heart sounds: Normal heart sounds, S1 normal and S2 normal. No murmur heard. Pulmonary:     Effort: Pulmonary effort is normal. No accessory muscle usage or respiratory distress.     Breath sounds: Normal breath sounds. No stridor. No wheezing, rhonchi or rales.     Comments: Decreased aeration bilateral bases. Abdominal:     General: Bowel sounds are normal.     Palpations: Abdomen is soft.     Tenderness: There is no abdominal tenderness.  Lymphadenopathy:     Head:     Right side of head: No submental, submandibular or tonsillar adenopathy.     Left side of head: No submental, submandibular or tonsillar adenopathy.     Cervical: No cervical adenopathy.  Neurological:     Mental Status: He is alert.  Psychiatric:        Behavior: Behavior is cooperative.     UC Treatments / Results  Labs (all labs ordered are listed, but only abnormal results are displayed) Labs Reviewed - No data to display  EKG   Radiology DG Chest 2 View  Result Date: 12/01/2020 CLINICAL DATA:  COVID positive.  Decreased breath sounds at the lung bases. EXAM: CHEST - 2 VIEW COMPARISON:  None. FINDINGS: The heart size and mediastinal contours are within normal limits. Normal pulmonary vascularity. Mild left lower lobe atelectasis. No focal consolidation, pleural effusion, or  pneumothorax. No acute osseous abnormality. IMPRESSION: 1. Mild left lower lobe atelectasis. Electronically Signed   By: Titus Dubin M.D.   On: 12/01/2020 13:44    Procedures Procedures (including critical care time)  Medications Ordered in UC Medications - No data to display  Initial Impression / Assessment and Plan / UC Course  I have reviewed the triage vital signs and the nursing notes.  Pertinent labs & imaging results that were available during my care of the patient were reviewed by me and considered in my medical decision making (see chart for details).      Patient had positive COVID-19 test at home so will not repeat testing today.  X-ray obtained given abnormal lung sounds which showed left-sided atelectasis with no acute abnormalities.  No evidence of acute infection that would warrant initiation of antibiotics.  Patient is unable to tolerate steroids.  Given age he is eligible for oral antivirals.  Unfortunately, we do not have a recent creatinine in either our EMR or Care Everywhere so we will use molnupiravir.  Discussed that if we obtained kidney function testing today to dose Paxlovid by the time this returned to prescribe him the medication, he would be outside of the 5-day window of effectiveness.  He was encouraged use over-the-counter medications for symptom relief.  Discussed alarm symptoms that warrant emergent evaluation.  Strict return precautions given to which patient expressed understanding.  Final Clinical Impressions(s) / UC Diagnoses   Final diagnoses:  COVID-19  Sore throat  Cough     Discharge Instructions      Your chest x-ray showed that you are not taking big deep breaths but there  was no evidence of pneumonia/infection.  I have called in Tessalon for cough.  Continue over-the-counter medications including Mucinex, Tylenol, Flonase for symptom relief.  Make sure that you are drinking plenty of fluid.  Given your age you are eligible for an antiviral.  We cannot do Paxlovid as this requires a recent kidney function test and by the time we drew this and had the results in order to dose the medication you would be outside of the 5-day window of effectiveness.  Instead we are going to use the other oral antiviral molnupiravir.  If you have any worsening symptoms you need to go to the emergency room.  Please follow-up with your primary care doctor within 1 week.     ED Prescriptions     Medication Sig Dispense Auth. Provider   benzonatate (TESSALON) 100 MG capsule Take 1 capsule (100 mg total) by mouth every 8 (eight) hours. 21 capsule Joven Mom K, PA-C   molnupiravir EUA 200 mg CAPS Take 4 capsules (800 mg total) by mouth 2 (two) times daily for 5 days. 40 capsule Mylah Baynes K, PA-C      PDMP not reviewed this encounter.   Terrilee Croak, PA-C 12/01/20 1410

## 2020-12-01 NOTE — Discharge Instructions (Addendum)
Your chest x-ray showed that you are not taking big deep breaths but there was no evidence of pneumonia/infection.  I have called in Tessalon for cough.  Continue over-the-counter medications including Mucinex, Tylenol, Flonase for symptom relief.  Make sure that you are drinking plenty of fluid.  Given your age you are eligible for an antiviral.  We cannot do Paxlovid as this requires a recent kidney function test and by the time we drew this and had the results in order to dose the medication you would be outside of the 5-day window of effectiveness.  Instead we are going to use the other oral antiviral molnupiravir.  If you have any worsening symptoms you need to go to the emergency room.  Please follow-up with your primary care doctor within 1 week.

## 2020-12-30 DIAGNOSIS — R7301 Impaired fasting glucose: Secondary | ICD-10-CM | POA: Diagnosis not present

## 2020-12-30 DIAGNOSIS — E039 Hypothyroidism, unspecified: Secondary | ICD-10-CM | POA: Diagnosis not present

## 2020-12-30 DIAGNOSIS — E785 Hyperlipidemia, unspecified: Secondary | ICD-10-CM | POA: Diagnosis not present

## 2021-01-06 DIAGNOSIS — I129 Hypertensive chronic kidney disease with stage 1 through stage 4 chronic kidney disease, or unspecified chronic kidney disease: Secondary | ICD-10-CM | POA: Diagnosis not present

## 2021-01-06 DIAGNOSIS — E039 Hypothyroidism, unspecified: Secondary | ICD-10-CM | POA: Diagnosis not present

## 2021-01-06 DIAGNOSIS — E871 Hypo-osmolality and hyponatremia: Secondary | ICD-10-CM | POA: Diagnosis not present

## 2021-01-06 DIAGNOSIS — R69 Illness, unspecified: Secondary | ICD-10-CM | POA: Diagnosis not present

## 2021-01-06 DIAGNOSIS — N1831 Chronic kidney disease, stage 3a: Secondary | ICD-10-CM | POA: Diagnosis not present

## 2021-01-06 DIAGNOSIS — R82998 Other abnormal findings in urine: Secondary | ICD-10-CM | POA: Diagnosis not present

## 2021-01-06 DIAGNOSIS — R7301 Impaired fasting glucose: Secondary | ICD-10-CM | POA: Diagnosis not present

## 2021-01-06 DIAGNOSIS — Z0000 Encounter for general adult medical examination without abnormal findings: Secondary | ICD-10-CM | POA: Diagnosis not present

## 2021-01-06 DIAGNOSIS — R413 Other amnesia: Secondary | ICD-10-CM | POA: Diagnosis not present

## 2021-01-06 DIAGNOSIS — M1990 Unspecified osteoarthritis, unspecified site: Secondary | ICD-10-CM | POA: Diagnosis not present

## 2021-01-06 DIAGNOSIS — E785 Hyperlipidemia, unspecified: Secondary | ICD-10-CM | POA: Diagnosis not present

## 2021-01-15 DIAGNOSIS — Z1212 Encounter for screening for malignant neoplasm of rectum: Secondary | ICD-10-CM | POA: Diagnosis not present

## 2021-05-08 ENCOUNTER — Encounter: Payer: Self-pay | Admitting: Internal Medicine

## 2021-05-20 DIAGNOSIS — R5383 Other fatigue: Secondary | ICD-10-CM | POA: Diagnosis not present

## 2021-05-20 DIAGNOSIS — E785 Hyperlipidemia, unspecified: Secondary | ICD-10-CM | POA: Diagnosis not present

## 2021-05-20 DIAGNOSIS — D649 Anemia, unspecified: Secondary | ICD-10-CM | POA: Diagnosis not present

## 2021-05-20 DIAGNOSIS — Z125 Encounter for screening for malignant neoplasm of prostate: Secondary | ICD-10-CM | POA: Diagnosis not present

## 2021-05-20 DIAGNOSIS — I10 Essential (primary) hypertension: Secondary | ICD-10-CM | POA: Diagnosis not present

## 2021-06-01 ENCOUNTER — Ambulatory Visit (AMBULATORY_SURGERY_CENTER): Payer: Medicare HMO | Admitting: *Deleted

## 2021-06-01 ENCOUNTER — Other Ambulatory Visit: Payer: Self-pay

## 2021-06-01 ENCOUNTER — Telehealth: Payer: Self-pay | Admitting: *Deleted

## 2021-06-01 ENCOUNTER — Other Ambulatory Visit: Payer: Self-pay | Admitting: Internal Medicine

## 2021-06-01 VITALS — Ht 70.0 in | Wt 215.0 lb

## 2021-06-01 DIAGNOSIS — Z1211 Encounter for screening for malignant neoplasm of colon: Secondary | ICD-10-CM

## 2021-06-01 MED ORDER — NA SULFATE-K SULFATE-MG SULF 17.5-3.13-1.6 GM/177ML PO SOLN: 1.0000 | ORAL | 0 refills | Status: DC

## 2021-06-01 NOTE — Progress Notes (Signed)
Patient's pre-visit was done today over the phone with the patient. Name,DOB and address verified. Patient denies any allergies to Eggs and Soy. Patient denies any problems with anesthesia/sedation. Patient is not taking any diet pills or blood thinners. No home Oxygen. Packet of Prep instructions mailed to patient including a copy of a consent form-pt is aware .Patient understands to call us back with any questions or concerns. Patient is aware of our care-partner policy and NGEXB-28 safety protocol.     The patient is COVID-19 vaccinated.

## 2021-06-01 NOTE — Telephone Encounter (Signed)
Spoke with the patient-he is aware the Suprep would not be covered and cost $110. Pt okay with this amount.

## 2021-06-04 DIAGNOSIS — R7301 Impaired fasting glucose: Secondary | ICD-10-CM | POA: Diagnosis not present

## 2021-06-04 DIAGNOSIS — R413 Other amnesia: Secondary | ICD-10-CM | POA: Diagnosis not present

## 2021-06-04 DIAGNOSIS — E039 Hypothyroidism, unspecified: Secondary | ICD-10-CM | POA: Diagnosis not present

## 2021-06-04 DIAGNOSIS — R0683 Snoring: Secondary | ICD-10-CM | POA: Diagnosis not present

## 2021-06-04 DIAGNOSIS — E871 Hypo-osmolality and hyponatremia: Secondary | ICD-10-CM | POA: Diagnosis not present

## 2021-06-04 DIAGNOSIS — N1831 Chronic kidney disease, stage 3a: Secondary | ICD-10-CM | POA: Diagnosis not present

## 2021-06-04 DIAGNOSIS — I129 Hypertensive chronic kidney disease with stage 1 through stage 4 chronic kidney disease, or unspecified chronic kidney disease: Secondary | ICD-10-CM | POA: Diagnosis not present

## 2021-06-04 DIAGNOSIS — Z8371 Family history of colonic polyps: Secondary | ICD-10-CM | POA: Diagnosis not present

## 2021-06-04 DIAGNOSIS — M1990 Unspecified osteoarthritis, unspecified site: Secondary | ICD-10-CM | POA: Diagnosis not present

## 2021-06-04 DIAGNOSIS — D649 Anemia, unspecified: Secondary | ICD-10-CM | POA: Diagnosis not present

## 2021-06-04 DIAGNOSIS — R69 Illness, unspecified: Secondary | ICD-10-CM | POA: Diagnosis not present

## 2021-06-04 DIAGNOSIS — E785 Hyperlipidemia, unspecified: Secondary | ICD-10-CM | POA: Diagnosis not present

## 2021-06-09 ENCOUNTER — Encounter: Payer: Self-pay | Admitting: Internal Medicine

## 2021-06-15 ENCOUNTER — Other Ambulatory Visit: Payer: Self-pay

## 2021-06-15 ENCOUNTER — Ambulatory Visit (AMBULATORY_SURGERY_CENTER): Payer: Medicare HMO | Admitting: Internal Medicine

## 2021-06-15 ENCOUNTER — Encounter: Payer: Self-pay | Admitting: Internal Medicine

## 2021-06-15 VITALS — BP 154/79 | HR 63 | Temp 97.2°F | Resp 11 | Ht 70.0 in | Wt 215.0 lb

## 2021-06-15 DIAGNOSIS — Z1211 Encounter for screening for malignant neoplasm of colon: Secondary | ICD-10-CM

## 2021-06-15 DIAGNOSIS — D123 Benign neoplasm of transverse colon: Secondary | ICD-10-CM | POA: Diagnosis not present

## 2021-06-15 DIAGNOSIS — D128 Benign neoplasm of rectum: Secondary | ICD-10-CM | POA: Diagnosis not present

## 2021-06-15 DIAGNOSIS — I10 Essential (primary) hypertension: Secondary | ICD-10-CM | POA: Diagnosis not present

## 2021-06-15 MED ORDER — SODIUM CHLORIDE 0.9 % IV SOLN: 500.0000 mL | Freq: Once | INTRAVENOUS | Status: DC

## 2021-06-15 NOTE — Progress Notes (Signed)
Called to room to assist during endoscopic procedure.  Patient ID and intended procedure confirmed with present staff. Received instructions for my participation in the procedure from the performing physician.  

## 2021-06-15 NOTE — Progress Notes (Signed)
Pt's states no medical or surgical changes since previsit or office visit.  VS CW  

## 2021-06-15 NOTE — Progress Notes (Signed)
Report to PACU, RN, vss, BBS= Clear.  

## 2021-06-15 NOTE — Progress Notes (Signed)
[facility] History and Physical   Primary Care Physician:  Pcp, No   Reason for Procedure:   CRCA screening  Plan:    colonoscopy     HPI: Bryan Thompson is a 71 y.o. male here for screening colonoscopy   Past Medical History:  Diagnosis Date   COVID-19    Depression    Hernia, umbilical    Hyperlipidemia    Hypertension    Thyroid disease     Past Surgical History:  Procedure Laterality Date   CATARACT EXTRACTION     TONSILLECTOMY      Prior to Admission medications   Medication Sig Start Date End Date Taking? Authorizing Provider  irbesartan (AVAPRO) 150 MG tablet Take 0.5 tablets by mouth daily. 10/14/16  Yes [provider]  levothyroxine (SYNTHROID, LEVOTHROID) 150 MCG tablet Take 150 mcg by mouth daily before breakfast.   Yes [provider]  Multiple Vitamins-Minerals (CENTRUM SILVER PO) Take by mouth.   Yes [provider]  sertraline (ZOLOFT) 100 MG tablet Take 100 mg by mouth daily. 05/18/21  Yes [provider]  vitamin B-12 (CYANOCOBALAMIN) 1000 MCG tablet Take 1,000 mcg by mouth daily.   Yes [provider]    Current Outpatient Medications  Medication Sig Dispense Refill   irbesartan (AVAPRO) 150 MG tablet Take 0.5 tablets by mouth daily.     levothyroxine (SYNTHROID, LEVOTHROID) 150 MCG tablet Take 150 mcg by mouth daily before breakfast.     Multiple Vitamins-Minerals (CENTRUM SILVER PO) Take by mouth.     sertraline (ZOLOFT) 100 MG tablet Take 100 mg by mouth daily.     vitamin B-12 (CYANOCOBALAMIN) 1000 MCG tablet Take 1,000 mcg by mouth daily.     Current Facility-Administered Medications  Medication Dose Route Frequency Provider Last Rate Last Admin   0.9 %  sodium chloride infusion  500 mL Intravenous Once Gatha Mayer, MD        Allergies as of 06/15/2021 - Review Complete 06/15/2021  Allergen Reaction Noted   Fish allergy  11/18/2017   Prednisone  11/18/2017    Family History   Problem Relation Age of Onset   Cancer Mother        breast CA   Heart disease Mother    Colon polyps Father    Hypertension Father    Cancer Father        colon CA   Colon cancer Neg Hx    Esophageal cancer Neg Hx    Rectal cancer Neg Hx    Stomach cancer Neg Hx     Social History   Socioeconomic History   Marital status: Married    Spouse name: Not on file   Number of children: Not on file   Years of education: Not on file   Highest education level: Not on file  Occupational History   Not on file  Tobacco Use   Smoking status: Never   Smokeless tobacco: Never  Vaping Use   Vaping Use: Never used  Substance and Sexual Activity   Alcohol use: Not Currently   Drug use: Never   Review of Systems:  other review of systems negative except as mentioned in the HPI.  Physical Exam: Vital signs BP (!) 188/88    Pulse 67    Temp (!) 97.2 F (36.2 C)    Ht 5\' 10"  (1.778 m)    Wt 215 lb (97.5 kg)    SpO2 97%    BMI 30.85 kg/m  General:   Alert,  Well-developed, well-nourished, pleasant and cooperative in NAD Lungs:  Clear throughout to auscultation.   Heart:  Regular rate and rhythm; no murmurs, clicks, rubs,  or gallops. Abdomen:  Soft, nontender and nondistended. Normal bowel sounds.   Neuro/Psych:  Alert and cooperative. Normal mood and affect. A and O x 3   @Desirai Traxler  Simonne Maffucci, MD, [facility] [phone] (pager) 06/15/2021 8:34 AM@

## 2021-06-15 NOTE — Op Note (Signed)
[facility] Patient Name: Bryan Thompson Procedure Date: 06/15/2021 8:34 AM MRN: [id number] Endoscopist: Gatha Mayer , MD Age: 71 Referring MD:  Date of Birth: [birth year] Gender: Male Account #: [id number] Procedure:                Colonoscopy Indications:              Screening for colorectal malignant neoplasm, This                            is the patient's first colonoscopy Medicines:                Propofol per Anesthesia, Monitored Anesthesia Care Procedure:                Pre-Anesthesia Assessment:                           - Prior to the procedure, a History and Physical                            was performed, and patient medications and                            allergies were reviewed. The patient's tolerance of                            previous anesthesia was also reviewed. The risks                            and benefits of the procedure and the sedation                            options and risks were discussed with the patient.                            All questions were answered, and informed consent                            was obtained. Prior Anticoagulants: The patient has                            taken no previous anticoagulant or antiplatelet                            agents. ASA Grade Assessment: II - A patient with                            mild systemic disease. After reviewing the risks                            and benefits, the patient was deemed in                            satisfactory condition to undergo the procedure.  After obtaining informed consent, the colonoscope                            was passed under direct vision. Throughout the                            procedure, the patient's blood pressure, pulse, and                            oxygen saturations were monitored continuously. The                            CF [id number] #[id number] was introduced through the anus                             and advanced to the the cecum, identified by                            appendiceal orifice and ileocecal valve. The                            colonoscopy was performed without difficulty. The                            patient tolerated the procedure well. The quality                            of the bowel preparation was good. The ileocecal                            valve, appendiceal orifice, and rectum were                            photographed. The bowel preparation used was                            Miralax via split dose instruction. The bowel                            preparation used was SUPREP via split dose                            instruction. Scope In: 8:42:47 AM Scope Out: 9:03:32 AM Scope Withdrawal Time: 0 hours 16 minutes 0 seconds  Total Procedure Duration: 0 hours 20 minutes 45 seconds  Findings:                 The perianal and digital rectal examinations were                            normal. Pertinent negatives include normal prostate                            (size, shape, and consistency).  Four sessile polyps were found in the rectum and                            transverse colon. The polyps were diminutive in                            size. These polyps were removed with a cold snare.                            Resection and retrieval were complete. Verification                            of patient identification for the specimen was                            done. Estimated blood loss was minimal.                           Multiple diverticula were found in the sigmoid                            colon.                           External and internal hemorrhoids were found.                           The exam was otherwise without abnormality on                            direct and retroflexion views. Complications:            No immediate complications. Estimated Blood Loss:     Estimated blood loss was  minimal. Impression:               - Four diminutive polyps in the rectum and in the                            transverse colon, removed with a cold snare.                            Resected and retrieved.                           - Diverticulosis in the sigmoid colon.                           - External and internal hemorrhoids.                           - The examination was otherwise normal on direct                            and retroflexion views. Recommendation:           - Patient has a contact number available for  emergencies. The signs and symptoms of potential                            delayed complications were discussed with the                            patient. Return to normal activities tomorrow.                            Written discharge instructions were provided to the                            patient.                           - Resume previous diet.                           - Continue present medications.                           - Await pathology results.                           - No recommendation at this time regarding repeat                            colonoscopy due to age. Gatha Mayer, MD 06/15/2021 9:11:08 AM This report has been signed electronically.

## 2021-06-15 NOTE — Patient Instructions (Addendum)
I found and removed 4 tiny polyps.  I will let you know pathology results and whether to have another routine colonoscopy by mail and/or My Chart.  You also have a condition called diverticulosis - common and not usually a problem. Please read the handout provided.  Your hemorrhoids (we all have) were swollen also - the prep does this.  I appreciate the opportunity to care for you. Gatha Mayer, MD, FACG   YOU HAD AN ENDOSCOPIC PROCEDURE TODAY AT [facility]:   Refer to the procedure report that was given to you for any specific questions about what was found during the examination.  If the procedure report does not answer your questions, please call your gastroenterologist to clarify.  If you requested that your care partner not be given the details of your procedure findings, then the procedure report has been included in a sealed envelope for you to review at your convenience later.  YOU SHOULD EXPECT: Some feelings of bloating in the abdomen. Passage of more gas than usual.  Walking can help get rid of the air that was put into your GI tract during the procedure and reduce the bloating. If you had a lower endoscopy (such as a colonoscopy or flexible sigmoidoscopy) you may notice spotting of blood in your stool or on the toilet paper. If you underwent a bowel prep for your procedure, you may not have a normal bowel movement for a few days.  Please Note:  You might notice some irritation and congestion in your nose or some drainage.  This is from the oxygen used during your procedure.  There is no need for concern and it should clear up in a day or so.  SYMPTOMS TO REPORT IMMEDIATELY:  Following lower endoscopy (colonoscopy or flexible sigmoidoscopy):  Excessive amounts of blood in the stool  Significant tenderness or worsening of abdominal pains  Swelling of the abdomen that is new, acute  Fever of 100F or higher  For urgent or emergent issues, a gastroenterologist  can be reached at any hour by calling [phone]. Do not use MyChart messaging for urgent concerns.    DIET:  We do recommend a small meal at first, but then you may proceed to your regular diet.  Drink plenty of fluids but you should avoid alcoholic beverages for 24 hours.  ACTIVITY:  You should plan to take it easy for the rest of today and you should NOT DRIVE or use heavy machinery until tomorrow (because of the sedation medicines used during the test).    FOLLOW UP: Our staff will call the number listed on your records 48-72 hours following your procedure to check on you and address any questions or concerns that you may have regarding the information given to you following your procedure. If we do not reach you, we will leave a message.  We will attempt to reach you two times.  During this call, we will ask if you have developed any symptoms of COVID 19. If you develop any symptoms (ie: fever, flu-like symptoms, shortness of breath, cough etc.) before then, please call [phone].  If you test positive for Covid 19 in the 2 weeks post procedure, please call and report this information to [place].    If any biopsies were taken you will be contacted by phone or by letter within the next 1-3 weeks.  Please call us at [phone] if you have not heard about the biopsies in 3 weeks.  SIGNATURES/CONFIDENTIALITY: You and/or your care partner have signed paperwork which will be entered into your electronic medical record.  These signatures attest to the fact that that the information above on your After Visit Summary has been reviewed and is understood.  Full responsibility of the confidentiality of this discharge information lies with you and/or your care-partner.

## 2021-06-17 ENCOUNTER — Telehealth: Payer: Self-pay | Admitting: *Deleted

## 2021-06-17 NOTE — Telephone Encounter (Signed)
°  Follow up Call-  Call back number 06/15/2021  Post procedure Call Back phone  # [phone]  Permission to leave phone message Yes  Some recent data might be hidden     Patient questions:  Do you have a fever, pain , or abdominal swelling? No. Pain Score  0 *  Have you tolerated food without any problems? Yes.    Have you been able to return to your normal activities? Yes.    Do you have any questions about your discharge instructions: Diet   No. Medications  No. Follow up visit  No.  Do you have questions or concerns about your Care? No.  Actions: * If pain score is 4 or above: No action needed, pain <4.  Have you developed a fever since your procedure? no  2.   Have you had an respiratory symptoms (SOB or cough) since your procedure? no  3.   Have you tested positive for COVID 19 since your procedure no  4.   Have you had any family members/close contacts diagnosed with the COVID 19 since your procedure?  no   If yes to any of these questions please route to Joylene Aziel, RN and Joella Prince, RN

## 2021-06-22 ENCOUNTER — Encounter: Payer: Self-pay | Admitting: Internal Medicine

## 2021-09-21 DIAGNOSIS — H26493 Other secondary cataract, bilateral: Secondary | ICD-10-CM | POA: Diagnosis not present

## 2021-09-21 DIAGNOSIS — I10 Essential (primary) hypertension: Secondary | ICD-10-CM | POA: Diagnosis not present

## 2021-09-21 DIAGNOSIS — H35033 Hypertensive retinopathy, bilateral: Secondary | ICD-10-CM | POA: Diagnosis not present

## 2021-09-25 DIAGNOSIS — R7989 Other specified abnormal findings of blood chemistry: Secondary | ICD-10-CM | POA: Diagnosis not present

## 2021-09-25 DIAGNOSIS — E785 Hyperlipidemia, unspecified: Secondary | ICD-10-CM | POA: Diagnosis not present

## 2021-09-25 DIAGNOSIS — I10 Essential (primary) hypertension: Secondary | ICD-10-CM | POA: Diagnosis not present

## 2021-10-02 DIAGNOSIS — M1990 Unspecified osteoarthritis, unspecified site: Secondary | ICD-10-CM | POA: Diagnosis not present

## 2021-10-02 DIAGNOSIS — Z8371 Family history of colonic polyps: Secondary | ICD-10-CM | POA: Diagnosis not present

## 2021-10-02 DIAGNOSIS — R413 Other amnesia: Secondary | ICD-10-CM | POA: Diagnosis not present

## 2021-10-02 DIAGNOSIS — E039 Hypothyroidism, unspecified: Secondary | ICD-10-CM | POA: Diagnosis not present

## 2021-10-02 DIAGNOSIS — N1831 Chronic kidney disease, stage 3a: Secondary | ICD-10-CM | POA: Diagnosis not present

## 2021-10-02 DIAGNOSIS — E785 Hyperlipidemia, unspecified: Secondary | ICD-10-CM | POA: Diagnosis not present

## 2021-10-02 DIAGNOSIS — R69 Illness, unspecified: Secondary | ICD-10-CM | POA: Diagnosis not present

## 2021-10-02 DIAGNOSIS — R7301 Impaired fasting glucose: Secondary | ICD-10-CM | POA: Diagnosis not present

## 2021-10-02 DIAGNOSIS — I129 Hypertensive chronic kidney disease with stage 1 through stage 4 chronic kidney disease, or unspecified chronic kidney disease: Secondary | ICD-10-CM | POA: Diagnosis not present

## 2021-10-02 DIAGNOSIS — L299 Pruritus, unspecified: Secondary | ICD-10-CM | POA: Diagnosis not present

## 2021-10-02 DIAGNOSIS — R0683 Snoring: Secondary | ICD-10-CM | POA: Diagnosis not present

## 2021-10-08 ENCOUNTER — Other Ambulatory Visit: Payer: Self-pay | Admitting: Internal Medicine

## 2021-10-08 DIAGNOSIS — N1831 Chronic kidney disease, stage 3a: Secondary | ICD-10-CM

## 2021-10-09 ENCOUNTER — Ambulatory Visit
Admission: RE | Admit: 2021-10-09 | Discharge: 2021-10-09 | Disposition: A | Discharge status: Home / Self Care | Payer: Medicare HMO | Source: Ambulatory Visit | Attending: Internal Medicine | Admitting: Internal Medicine

## 2021-10-09 DIAGNOSIS — N1831 Chronic kidney disease, stage 3a: Secondary | ICD-10-CM

## 2021-10-09 DIAGNOSIS — N1832 Chronic kidney disease, stage 3b: Secondary | ICD-10-CM | POA: Diagnosis not present

## 2021-10-12 DIAGNOSIS — H26493 Other secondary cataract, bilateral: Secondary | ICD-10-CM | POA: Diagnosis not present

## 2021-10-26 DIAGNOSIS — I10 Essential (primary) hypertension: Secondary | ICD-10-CM | POA: Diagnosis not present

## 2021-10-26 DIAGNOSIS — E785 Hyperlipidemia, unspecified: Secondary | ICD-10-CM | POA: Diagnosis not present

## 2021-10-26 DIAGNOSIS — E871 Hypo-osmolality and hyponatremia: Secondary | ICD-10-CM | POA: Diagnosis not present

## 2021-11-17 DIAGNOSIS — Z0100 Encounter for examination of eyes and vision without abnormal findings: Secondary | ICD-10-CM | POA: Diagnosis not present

## 2021-12-23 ENCOUNTER — Telehealth: Payer: Self-pay | Admitting: Physician Assistant

## 2021-12-23 NOTE — Telephone Encounter (Signed)
Scheduled appt per 8/1 referral. Pt is aware of appt date and time. Pt is aware to arrive 15 mins prior to appt time and to bring and updated insurance card. Pt is aware of appt location.   

## 2022-01-07 DIAGNOSIS — E039 Hypothyroidism, unspecified: Secondary | ICD-10-CM | POA: Diagnosis not present

## 2022-01-07 DIAGNOSIS — D649 Anemia, unspecified: Secondary | ICD-10-CM | POA: Diagnosis not present

## 2022-01-07 DIAGNOSIS — R5383 Other fatigue: Secondary | ICD-10-CM | POA: Diagnosis not present

## 2022-01-07 DIAGNOSIS — R7301 Impaired fasting glucose: Secondary | ICD-10-CM | POA: Diagnosis not present

## 2022-01-07 DIAGNOSIS — E785 Hyperlipidemia, unspecified: Secondary | ICD-10-CM | POA: Diagnosis not present

## 2022-01-07 DIAGNOSIS — R7989 Other specified abnormal findings of blood chemistry: Secondary | ICD-10-CM | POA: Diagnosis not present

## 2022-01-07 DIAGNOSIS — Z125 Encounter for screening for malignant neoplasm of prostate: Secondary | ICD-10-CM | POA: Diagnosis not present

## 2022-01-14 DIAGNOSIS — E039 Hypothyroidism, unspecified: Secondary | ICD-10-CM | POA: Diagnosis not present

## 2022-01-14 DIAGNOSIS — R82998 Other abnormal findings in urine: Secondary | ICD-10-CM | POA: Diagnosis not present

## 2022-01-14 DIAGNOSIS — L299 Pruritus, unspecified: Secondary | ICD-10-CM | POA: Diagnosis not present

## 2022-01-14 DIAGNOSIS — Z0000 Encounter for general adult medical examination without abnormal findings: Secondary | ICD-10-CM | POA: Diagnosis not present

## 2022-01-14 DIAGNOSIS — M1990 Unspecified osteoarthritis, unspecified site: Secondary | ICD-10-CM | POA: Diagnosis not present

## 2022-01-14 DIAGNOSIS — I129 Hypertensive chronic kidney disease with stage 1 through stage 4 chronic kidney disease, or unspecified chronic kidney disease: Secondary | ICD-10-CM | POA: Diagnosis not present

## 2022-01-14 DIAGNOSIS — E785 Hyperlipidemia, unspecified: Secondary | ICD-10-CM | POA: Diagnosis not present

## 2022-01-14 DIAGNOSIS — Z8371 Family history of colonic polyps: Secondary | ICD-10-CM | POA: Diagnosis not present

## 2022-01-14 DIAGNOSIS — R0683 Snoring: Secondary | ICD-10-CM | POA: Diagnosis not present

## 2022-01-14 DIAGNOSIS — R69 Illness, unspecified: Secondary | ICD-10-CM | POA: Diagnosis not present

## 2022-01-14 DIAGNOSIS — N1831 Chronic kidney disease, stage 3a: Secondary | ICD-10-CM | POA: Diagnosis not present

## 2022-01-14 DIAGNOSIS — R778 Other specified abnormalities of plasma proteins: Secondary | ICD-10-CM | POA: Diagnosis not present

## 2022-01-14 DIAGNOSIS — R7301 Impaired fasting glucose: Secondary | ICD-10-CM | POA: Diagnosis not present

## 2022-01-14 NOTE — Progress Notes (Signed)
[facility] [phone]   Fax:([phone] [facility] NOTE  Patient Care Team: Prince Solian, MD as PCP - General (Internal Medicine)  Hematological/Oncological History 1) Labs from PCP, Dr. Prince Solian: -09/25/2021: Creatinine 1.8, Bun 26 -10/26/2021: Random Urine-no M-spike detected. SPEP-no M-spoke, IFE showed IgM monoclonal protein. Creatinine 1.2 -01/07/2022: WBC 8.61, Hgb 13.8, Plt 238, Creatinine 1.3, Calcium 9.0  2) 01/15/2022: Establish care with [facility] Hematology with Dr. Narda Rutherford and Dede Query PA-C  CHIEF COMPLAINTS/PURPOSE OF CONSULTATION:  Monoclonal gammopathy  HISTORY OF PRESENTING ILLNESS:  Julious Payer 71 y.o. male with medical history significant for hypothyroidism, hyperlipidemia and hypertension presents to the clinic for initial evaluation for monoclonal gammopathy.  He is unaccompanied for this visit.  On exam today, Mr. Huish reports that his energy levels are stable and he continues to complete all his daily activities on his own.  He has a good appetite and denies any weight loss.  He denies nausea, vomiting or abdominal pain.  His bowel habits are unchanged without any recurrent episodes of diarrhea or constipation.  He denies easy bruising or signs of active bleeding.  Patient denies any bone pain including back or hip pain.  He denies fevers, chills, night sweats, shortness of breath, chest pain or cough.  He has no other complaints.  Rest of the 10 point ROS is below.  MEDICAL HISTORY:  Past Medical History:  Diagnosis Date   COVID-19    Depression    Hernia, umbilical    Hyperlipidemia    Hypertension    Thyroid disease     SURGICAL HISTORY: Past Surgical History:  Procedure Laterality Date   CATARACT EXTRACTION     TONSILLECTOMY      SOCIAL HISTORY: Social History   Socioeconomic History   Marital status: Married    Spouse name: Not on file   Number of children: Not on file   Years of  education: Not on file   Highest education level: Not on file  Occupational History   Not on file  Tobacco Use   Smoking status: Never   Smokeless tobacco: Never  Vaping Use   Vaping Use: Never used  Substance and Sexual Activity   Alcohol use: Not Currently   Drug use: Never   Sexual activity: Not on file  Other Topics Concern   Not on file  Social History Narrative   Not on file   Social Determinants of Health   Financial Resource Strain: Not on file  Food Insecurity: Not on file  Transportation Needs: Not on file  Physical Activity: Not on file  Stress: Not on file  Social Connections: Not on file  Intimate Partner Violence: Not on file    FAMILY HISTORY: Family History  Problem Relation Age of Onset   Cancer Mother        breast CA   Heart disease Mother    Colon polyps Father    Hypertension Father    Colon cancer Neg Hx    Esophageal cancer Neg Hx    Rectal cancer Neg Hx    Stomach cancer Neg Hx     ALLERGIES:  is allergic to fish allergy and prednisone.  MEDICATIONS:  Current Outpatient Medications  Medication Sig Dispense Refill   irbesartan (AVAPRO) 150 MG tablet Take 0.5 tablets by mouth daily.     levothyroxine (SYNTHROID, LEVOTHROID) 150 MCG tablet Take 150 mcg by mouth daily before breakfast.     Multiple Vitamins-Minerals (CENTRUM SILVER PO) Take  by mouth.     sertraline (ZOLOFT) 100 MG tablet Take 100 mg by mouth daily.     vitamin B-12 (CYANOCOBALAMIN) 1000 MCG tablet Take 1,000 mcg by mouth daily.     No current facility-administered medications for this visit.    REVIEW OF SYSTEMS:   Constitutional: ( - ) fevers, ( - )  chills , ( - ) night sweats Eyes: ( - ) blurriness of vision, ( - ) double vision, ( - ) watery eyes Ears, nose, mouth, throat, and face: ( - ) mucositis, ( - ) sore throat Respiratory: ( - ) cough, ( - ) dyspnea, ( - ) wheezes Cardiovascular: ( - ) palpitation, ( - ) chest discomfort, ( - ) lower extremity  swelling Gastrointestinal:  ( - ) nausea, ( - ) heartburn, ( - ) change in bowel habits Skin: ( - ) abnormal skin rashes Lymphatics: ( - ) new lymphadenopathy, ( - ) easy bruising Neurological: ( - ) numbness, ( - ) tingling, ( - ) new weaknesses Behavioral/Psych: ( - ) mood change, ( - ) new changes  All other systems were reviewed with the patient and are negative.  PHYSICAL EXAMINATION: ECOG PERFORMANCE STATUS: 0 - Asymptomatic  Vitals:   01/15/22 1102  BP: 133/74  Pulse: 72  Resp: 18  Temp: 97.9 F (36.6 C)  SpO2: 96%   Filed Weights   01/15/22 1102  Weight: 221 lb 11.2 oz (100.6 kg)    GENERAL: well appearing male in NAD  SKIN: skin color, texture, turgor are normal, no rashes or significant lesions EYES: conjunctiva are pink and non-injected, sclera clear OROPHARYNX: no exudate, no erythema; lips, buccal mucosa, and tongue normal  NECK: supple, non-tender LYMPH:  no palpable lymphadenopathy in the cervical or supraclavicular lymph nodes.  LUNGS: clear to auscultation and percussion with normal breathing effort HEART: regular rate & rhythm and no murmurs and no lower extremity edema ABDOMEN: soft, non-tender, non-distended, normal bowel sounds Musculoskeletal: no cyanosis of digits and no clubbing  PSYCH: alert & oriented x 3, fluent speech NEURO: no focal motor/sensory deficits  LABORATORY DATA:  I have reviewed the data as listed     No data to display              No data to display          ASSESSMENT & PLAN Zaveon Gillen is a 71 y.o. male who presents to the hematology clinic for evaluation of monoclonal gammopathy.Based on outside labs from 10/26/2021, there is evidence of IgM monoclonal protein that was detected and immunofixation.  M protein was not quantifiable.  We reviewed the recommended workup which includes serologic work-up to check CBC, CMP, SPEP with IFE, kappa/lambda light chain, beta 2 microglobulin and LDH levels.  Patient will need to  collect 24-hour urine specimen for urine protein electrophoresis.  Lastly a bone mets survey will be obtained to evaluate for lytic lesions. If no additional intervention is required, we will plan to see patient back in clinic in 3 months.    #IgM monoclonal protein: --Recommend further workup with CBC, CMP, SPEP with IFE, kappa/lambda light chain, UPEP,beta 2 microglobulin and LDH levels.  --Need to obtain DG bone met surgery to evaluate for lytic lesions.  --If M-spike is detected with IgM protein, then need to arrange for bone marrow biopsy --RTC in 6 months with labs unless further intervention is required.    Orders Placed This Encounter  Procedures   DG Bone Survey  Met    Standing Status:   Future    Standing Expiration Date:   01/15/2023    Order Specific Question:   Reason for Exam (SYMPTOM  OR DIAGNOSIS REQUIRED)    Answer:   evaluate for lytic lesions    Order Specific Question:   Preferred imaging location?    Answer:   [facility]   CBC with Differential (Cancer Center Only)    Standing Status:   Future    Standing Expiration Date:   01/15/2023   CMP (Cancer Center only)    Standing Status:   Future    Standing Expiration Date:   01/15/2023   Lactate dehydrogenase (LDH)    Standing Status:   Future    Standing Expiration Date:   01/14/2023   Multiple Myeloma Panel (SPEP&IFE w/QIG)    Standing Status:   Future    Standing Expiration Date:   01/14/2023   Kappa/lambda light chains    Standing Status:   Future    Standing Expiration Date:   01/14/2023   24-Hr Ur UPEP/UIFE/Light Chains/TP    Standing Status:   Future    Standing Expiration Date:   01/14/2023   Beta 2 microglobulin    Standing Status:   Future    Standing Expiration Date:   01/14/2023    All questions were answered. The patient knows to call the clinic with any problems, questions or concerns.  I have spent a total of 60 minutes minutes of face-to-face and non-face-to-face time, preparing to see the  patient, obtaining and/or reviewing separately obtained history, performing a medically appropriate examination, counseling and educating the patient, ordering tests/procedures,  documenting clinical information in the electronic health record, and care coordination.   Dede Query, PA-C Department of Hematology/Oncology [place] at [facility] Phone: [phone]  Patient was seen with Dr. Lorenso Courier  I have read the above note and personally examined the patient. I agree with the assessment and plan as noted above.  Briefly Mr. Nhat Hearne is a 71 year old male who presents for evaluation of a monoclonal myopathy.  The patient had an SPEP ordered which did not show any monoclonal protein but on IFE did show a light band of IgM M protein.  Today we will conduct a full MGUS work-up to include SPEP, serum free light chains, metastatic survey, and UPEP.  In the event no M protein is detected there is no need for further routine follow-up visit.  However if our IFE confirms continued IgM M protein would recommend continued monitoring.  The patient voiced understanding of the plan moving forward.   Ledell Peoples, MD Department of Hematology/Oncology [place] at [facility] Phone: [phone] Pager: [phone] Email: [email or web address]

## 2022-01-15 ENCOUNTER — Encounter: Payer: Self-pay | Admitting: Physician Assistant

## 2022-01-15 ENCOUNTER — Other Ambulatory Visit: Payer: Self-pay

## 2022-01-15 ENCOUNTER — Inpatient Hospital Stay: Payer: Medicare HMO | Attending: Physician Assistant | Admitting: Physician Assistant

## 2022-01-15 ENCOUNTER — Inpatient Hospital Stay: Payer: Medicare HMO

## 2022-01-15 VITALS — BP 133/74 | HR 72 | Temp 97.9°F | Resp 18 | Ht 70.0 in | Wt 221.7 lb

## 2022-01-15 DIAGNOSIS — E039 Hypothyroidism, unspecified: Secondary | ICD-10-CM

## 2022-01-15 DIAGNOSIS — Z803 Family history of malignant neoplasm of breast: Secondary | ICD-10-CM | POA: Insufficient documentation

## 2022-01-15 DIAGNOSIS — Z79890 Hormone replacement therapy (postmenopausal): Secondary | ICD-10-CM | POA: Diagnosis not present

## 2022-01-15 DIAGNOSIS — Z79899 Other long term (current) drug therapy: Secondary | ICD-10-CM

## 2022-01-15 DIAGNOSIS — E785 Hyperlipidemia, unspecified: Secondary | ICD-10-CM

## 2022-01-15 DIAGNOSIS — D472 Monoclonal gammopathy: Secondary | ICD-10-CM | POA: Diagnosis not present

## 2022-01-15 DIAGNOSIS — Z888 Allergy status to other drugs, medicaments and biological substances status: Secondary | ICD-10-CM

## 2022-01-15 DIAGNOSIS — I10 Essential (primary) hypertension: Secondary | ICD-10-CM

## 2022-01-15 DIAGNOSIS — Z8616 Personal history of COVID-19: Secondary | ICD-10-CM

## 2022-01-15 DIAGNOSIS — Z8371 Family history of colonic polyps: Secondary | ICD-10-CM

## 2022-01-15 DIAGNOSIS — Z8249 Family history of ischemic heart disease and other diseases of the circulatory system: Secondary | ICD-10-CM

## 2022-01-15 LAB — CMP (CANCER CENTER ONLY)
ALT: 11 U/L (ref 0–44)
AST: 17 U/L (ref 15–41)
Albumin: 4.4 g/dL (ref 3.5–5.0)
Alkaline Phosphatase: 57 U/L (ref 38–126)
Anion gap: 6 (ref 5–15)
BUN: 16 mg/dL (ref 8–23)
CO2: 29 mmol/L (ref 22–32)
Calcium: 9.7 mg/dL (ref 8.9–10.3)
Chloride: 101 mmol/L (ref 98–111)
Creatinine: 1.2 mg/dL (ref 0.61–1.24)
GFR, Estimated: >60 mL/min (ref >60)
Glucose, Bld: 100 mg/dL — ABNORMAL HIGH (ref 70–99)
Potassium: 4.4 mmol/L (ref 3.5–5.1)
Sodium: 136 mmol/L (ref 135–145)
Total Bilirubin: 0.5 mg/dL (ref 0.3–1.2)
Total Protein: 7.3 g/dL (ref 6.5–8.1)

## 2022-01-15 LAB — CBC WITH DIFFERENTIAL (CANCER CENTER ONLY)
Abs Immature Granulocytes: 0.02 10*3/uL (ref 0.00–0.07)
Basophils Absolute: 0.1 10*3/uL (ref 0.0–0.1)
Basophils Relative: 1 %
Eosinophils Absolute: 0.2 10*3/uL (ref 0.0–0.5)
Eosinophils Relative: 3 %
HCT: 39.3 % (ref 39.0–52.0)
Hemoglobin: 13.9 g/dL (ref 13.0–17.0)
Immature Granulocytes: 0 %
Lymphocytes Relative: 35 %
Lymphs Abs: 2.5 10*3/uL (ref 0.7–4.0)
MCH: 31.9 pg (ref 26.0–34.0)
MCHC: 35.4 g/dL (ref 30.0–36.0)
MCV: 90.1 fL (ref 80.0–100.0)
Monocytes Absolute: 0.8 10*3/uL (ref 0.1–1.0)
Monocytes Relative: 12 %
Neutro Abs: 3.5 10*3/uL (ref 1.7–7.7)
Neutrophils Relative %: 49 %
Platelet Count: 287 10*3/uL (ref 150–400)
RBC: 4.36 MIL/uL (ref 4.22–5.81)
RDW: 12.8 % (ref 11.5–15.5)
WBC Count: 7 10*3/uL (ref 4.0–10.5)
nRBC: 0 % (ref 0.0–0.2)

## 2022-01-15 LAB — LACTATE DEHYDROGENASE: LDH: 120 U/L (ref 98–192)

## 2022-01-16 LAB — BETA 2 MICROGLOBULIN, SERUM: Beta-2 Microglobulin: 2.3 mg/L (ref 0.6–2.4)

## 2022-01-18 ENCOUNTER — Ambulatory Visit (HOSPITAL_COMMUNITY)
Admission: RE | Admit: 2022-01-18 | Discharge: 2022-01-18 | Disposition: A | Discharge status: Home / Self Care | Payer: Medicare HMO | Source: Ambulatory Visit | Attending: Physician Assistant | Admitting: Physician Assistant

## 2022-01-18 ENCOUNTER — Other Ambulatory Visit: Payer: Self-pay

## 2022-01-18 DIAGNOSIS — D472 Monoclonal gammopathy: Secondary | ICD-10-CM | POA: Insufficient documentation

## 2022-01-18 DIAGNOSIS — Z8616 Personal history of COVID-19: Secondary | ICD-10-CM | POA: Diagnosis not present

## 2022-01-18 DIAGNOSIS — E785 Hyperlipidemia, unspecified: Secondary | ICD-10-CM | POA: Diagnosis not present

## 2022-01-18 DIAGNOSIS — Z8371 Family history of colonic polyps: Secondary | ICD-10-CM | POA: Diagnosis not present

## 2022-01-18 DIAGNOSIS — Z79890 Hormone replacement therapy (postmenopausal): Secondary | ICD-10-CM | POA: Diagnosis not present

## 2022-01-18 DIAGNOSIS — Z888 Allergy status to other drugs, medicaments and biological substances status: Secondary | ICD-10-CM | POA: Diagnosis not present

## 2022-01-18 DIAGNOSIS — Z79899 Other long term (current) drug therapy: Secondary | ICD-10-CM | POA: Diagnosis not present

## 2022-01-18 DIAGNOSIS — I10 Essential (primary) hypertension: Secondary | ICD-10-CM | POA: Diagnosis not present

## 2022-01-18 DIAGNOSIS — Z8249 Family history of ischemic heart disease and other diseases of the circulatory system: Secondary | ICD-10-CM | POA: Diagnosis not present

## 2022-01-18 DIAGNOSIS — E039 Hypothyroidism, unspecified: Secondary | ICD-10-CM | POA: Diagnosis not present

## 2022-01-18 DIAGNOSIS — Z803 Family history of malignant neoplasm of breast: Secondary | ICD-10-CM | POA: Diagnosis not present

## 2022-01-18 LAB — KAPPA/LAMBDA LIGHT CHAINS
Kappa free light chain: 26.4 mg/L — ABNORMAL HIGH (ref 3.3–19.4)
Kappa, lambda light chain ratio: 1.42 (ref 0.26–1.65)
Lambda free light chains: 18.6 mg/L (ref 5.7–26.3)

## 2022-01-19 ENCOUNTER — Encounter: Payer: Self-pay | Admitting: Physician Assistant

## 2022-01-20 LAB — UPEP/UIFE/LIGHT CHAINS/TP, 24-HR UR
% BETA, Urine: 0 %
ALPHA 1 URINE: 0 %
Albumin, U: 0 %
Alpha 2, Urine: 0 %
Free Kappa Lt Chains,Ur: 6.37 mg/L (ref 1.17–86.46)
Free Kappa/Lambda Ratio: 5.79 (ref 1.83–14.26)
Free Lambda Lt Chains,Ur: 1.1 mg/L (ref 0.27–15.21)
GAMMA GLOBULIN URINE: 0 %
Total Protein, Urine-Ur/day: <156 mg/24 hr — ABNORMAL HIGH (ref 30–150)
Total Protein, Urine: <4 mg/dL
Total Volume: 3900

## 2022-01-20 LAB — MULTIPLE MYELOMA PANEL, SERUM
Albumin SerPl Elph-Mcnc: 3.8 g/dL (ref 2.9–4.4)
Albumin/Glob SerPl: 1.3 (ref 0.7–1.7)
Alpha 1: 0.1 g/dL (ref 0.0–0.4)
Alpha2 Glob SerPl Elph-Mcnc: 0.7 g/dL (ref 0.4–1.0)
B-Globulin SerPl Elph-Mcnc: 1.3 g/dL (ref 0.7–1.3)
Gamma Glob SerPl Elph-Mcnc: 0.8 g/dL (ref 0.4–1.8)
Globulin, Total: 3 g/dL (ref 2.2–3.9)
IgA: 315 mg/dL (ref 61–437)
IgG (Immunoglobin G), Serum: 1004 mg/dL (ref 603–1613)
IgM (Immunoglobulin M), Srm: 165 mg/dL — ABNORMAL HIGH (ref 15–143)
Total Protein ELP: 6.8 g/dL (ref 6.0–8.5)

## 2022-01-21 ENCOUNTER — Telehealth: Payer: Self-pay | Admitting: Physician Assistant

## 2022-01-21 NOTE — Telephone Encounter (Signed)
I spoke to [name] to review the lab results from 01/15/2022.  Findings are consistent with MGUS as an M spike was not quantifiable but immunofixation did show IgM lambda monoclonal gammopathy.  No evidence of lytic lesions, cytopenias, hypercalcemia or renal dysfunction.  Proceed with monitoring and see the patient back in 6 months with repeat labs.  He expressed understanding and satisfaction with the plan provided.

## 2022-03-08 ENCOUNTER — Ambulatory Visit
Admission: RE | Admit: 2022-03-08 | Discharge: 2022-03-08 | Disposition: A | Discharge status: Home / Self Care | Payer: Medicare HMO | Source: Ambulatory Visit | Attending: Family Medicine | Admitting: Family Medicine

## 2022-03-08 VITALS — BP 165/84 | HR 81 | Temp 98.9°F | Resp 18 | Ht 70.0 in | Wt 215.0 lb

## 2022-03-08 DIAGNOSIS — R059 Cough, unspecified: Secondary | ICD-10-CM

## 2022-03-08 LAB — RESP PANEL BY RT-PCR (FLU A&B, COVID) ARPGX2
Influenza A by PCR: NEGATIVE
Influenza B by PCR: NEGATIVE
SARS Coronavirus 2 by RT PCR: NEGATIVE

## 2022-03-08 MED ORDER — AZITHROMYCIN 250 MG PO TABS: 250.0000 mg | ORAL_TABLET | Freq: Every day | ORAL | 0 refills | Status: DC

## 2022-03-08 NOTE — Discharge Instructions (Addendum)
Advised patient/spouse to hold Zithromax for the next 2 to 3 days if symptoms worsen may start this medication.  If starting this medication take with food to completion.  Encourage patient increase daily water intake to 64 ounces per day if taking this medication.  Advised patient we will follow-up with COVID-19/influenza results once received.  Advised patient may take Tylenol 1000 to 4000 mg daily, as needed for fever.

## 2022-03-08 NOTE — ED Triage Notes (Signed)
Patient c/o head cold, sore throat, body aches, productive cough.  Patient has taken a home COVID tests were negative x 2 but tests were out of date.  Patient has taken Zyrtec and Mucinex.

## 2022-03-08 NOTE — ED Provider Notes (Signed)
[facility]    CSN: [id number] Arrival date & time: 03/08/22  1439      History   Chief Complaint Chief Complaint  Patient presents with   Cough    Head cold, sore throat, cough congestion, sats 88-94Tested for covid fri and sat. negative [place] yellow - Entered by patient   Appointment    HPI Bryan Thompson is a 71 y.o. male.   HPI 71 year old male presents with head cold, sore throat, body aches, productive cough for 2 days patient reports COVID home 19 test were out of date patient reports taking OTC Zyrtec and Mucinex.  PMH significant for thyroid disease and HTN.  Patient is accompanied by his wife this evening.  Past Medical History:  Diagnosis Date   COVID-19    Depression    Hernia, umbilical    Hyperlipidemia    Hypertension    Thyroid disease     Patient Active Problem List   Diagnosis Date Noted   Monoclonal gammopathies 01/15/2022    Past Surgical History:  Procedure Laterality Date   CATARACT EXTRACTION     TONSILLECTOMY         Home Medications    Prior to Admission medications   Medication Sig Start Date End Date Taking? Authorizing Provider  azithromycin (ZITHROMAX) 250 MG tablet Take 1 tablet (250 mg total) by mouth daily. Take first 2 tablets together, then 1 every day until finished. 03/08/22  Yes Eliezer Lofts, FNP  irbesartan (AVAPRO) 150 MG tablet Take 0.5 tablets by mouth daily. 10/14/16  Yes [provider]  levothyroxine (SYNTHROID, LEVOTHROID) 150 MCG tablet Take 150 mcg by mouth daily before breakfast.   Yes [provider]  Multiple Vitamins-Minerals (CENTRUM SILVER PO) Take by mouth.   Yes [provider]  sertraline (ZOLOFT) 100 MG tablet Take 100 mg by mouth daily. 05/18/21  Yes [provider]  vitamin B-12 (CYANOCOBALAMIN) 1000 MCG tablet Take 1,000 mcg by mouth daily.   Yes [provider]    Family History Family History  Problem Relation Age of Onset   Cancer Mother         breast CA   Heart disease Mother    Colon polyps Father    Hypertension Father    Colon cancer Neg Hx    Esophageal cancer Neg Hx    Rectal cancer Neg Hx    Stomach cancer Neg Hx     Social History Social History   Tobacco Use   Smoking status: Never   Smokeless tobacco: Never  Vaping Use   Vaping Use: Never used  Substance Use Topics   Alcohol use: Not Currently   Drug use: Never     Allergies   Fish allergy and Prednisone   Review of Systems Review of Systems  HENT:  Positive for postnasal drip and sore throat.   Respiratory:  Positive for cough.   Musculoskeletal:  Positive for arthralgias and myalgias.  All other systems reviewed and are negative.    Physical Exam Triage Vital Signs ED Triage Vitals  Enc Vitals Group     BP 03/08/22 1528 (!) 165/84     Pulse Rate 03/08/22 1528 81     Resp 03/08/22 1528 18     Temp 03/08/22 1528 98.9 F (37.2 C)     Temp Source 03/08/22 1528 Oral     SpO2 03/08/22 1528 97 %     Weight 03/08/22 1530 215 lb (97.5 kg)     Height 03/08/22  1530 '5\' 10"'$  (1.778 m)     Head Circumference --      Peak Flow --      Pain Score 03/08/22 1529 0     Pain Loc --      Pain Edu? --      Excl. in [place]? --    No data found.  Updated Vital Signs BP (!) 165/84 (BP Location: Left Arm)   Pulse 81   Temp 98.9 F (37.2 C) (Oral)   Resp 18   Ht '5\' 10"'$  (1.778 m)   Wt 215 lb (97.5 kg)   SpO2 97%   BMI 30.85 kg/m      Physical Exam Vitals and nursing note reviewed.  Constitutional:      General: He is not in acute distress.    Appearance: Normal appearance. He is obese. He is not ill-appearing.  HENT:     Head: Normocephalic and atraumatic.     Right Ear: Tympanic membrane, ear canal and external ear normal.     Left Ear: Tympanic membrane, ear canal and external ear normal.     Mouth/Throat:     Mouth: Mucous membranes are moist.     Pharynx: Oropharynx is clear.  Eyes:     Extraocular Movements: Extraocular movements  intact.     Conjunctiva/sclera: Conjunctivae normal.     Pupils: Pupils are equal, round, and reactive to light.  Cardiovascular:     Rate and Rhythm: Normal rate and regular rhythm.     Pulses: Normal pulses.     Heart sounds: Normal heart sounds. No murmur heard. Pulmonary:     Effort: Pulmonary effort is normal.     Breath sounds: Normal breath sounds. No wheezing, rhonchi or rales.  Musculoskeletal:        General: Normal range of motion.     Cervical back: Normal range of motion and neck supple.  Skin:    General: Skin is warm and dry.  Neurological:     General: No focal deficit present.     Mental Status: He is alert and oriented to person, place, and time.      UC Treatments / Results  Labs (all labs ordered are listed, but only abnormal results are displayed) Labs Reviewed  RESP PANEL BY RT-PCR (FLU A&B, COVID) [id number]    EKG   Radiology No results found.  Procedures Procedures (including critical care time)  Medications Ordered in UC Medications - No data to display  Initial Impression / Assessment and Plan / UC Course  I have reviewed the triage vital signs and the nursing notes.  Pertinent labs & imaging results that were available during my care of the patient were reviewed by me and considered in my medical decision making (see chart for details).     MDM: 1.  Cough-lab 10093 ordered, Rx'd Zithromax. Advised patient/spouse to hold Zithromax for the next 2 to 3 days if symptoms worsen may start this medication.  If starting this medication take with food to completion.  Encourage patient increase daily water intake to 64 ounces per day if taking this medication.  Advised patient we will follow-up with COVID-19/Influenza results once received.  Advised patient may take Tylenol 1000 to 4000 mg daily, as needed for fever.  Patient discharged home, hemodynamically stable. Final Clinical Impressions(s) / UC Diagnoses   Final diagnoses:  Cough, unspecified  type     Discharge Instructions      Advised patient/spouse to hold Zithromax for the next 2  to 3 days if symptoms worsen may start this medication.  If starting this medication take with food to completion.  Encourage patient increase daily water intake to 64 ounces per day if taking this medication.  Advised patient we will follow-up with COVID-19/influenza results once received.  Advised patient may take Tylenol 1000 to 4000 mg daily, as needed for fever.     ED Prescriptions     Medication Sig Dispense Auth. Provider   azithromycin (ZITHROMAX) 250 MG tablet Take 1 tablet (250 mg total) by mouth daily. Take first 2 tablets together, then 1 every day until finished. 6 tablet Eliezer Lofts, FNP      PDMP not reviewed this encounter.   Eliezer Lofts, [place] 03/08/22 1653

## 2022-03-09 ENCOUNTER — Telehealth: Payer: Self-pay

## 2022-03-09 NOTE — Telephone Encounter (Signed)
Called pt to check on status since UC visit. States he's not much better since UC visit. Started the zpak today. Informed results are neg for COVID and flu. Call back if any questions or concerns. F/u if not better by end of week.

## 2022-07-12 DIAGNOSIS — N1831 Chronic kidney disease, stage 3a: Secondary | ICD-10-CM | POA: Diagnosis not present

## 2022-07-12 DIAGNOSIS — R413 Other amnesia: Secondary | ICD-10-CM | POA: Diagnosis not present

## 2022-07-12 DIAGNOSIS — R7301 Impaired fasting glucose: Secondary | ICD-10-CM | POA: Diagnosis not present

## 2022-07-12 DIAGNOSIS — L299 Pruritus, unspecified: Secondary | ICD-10-CM | POA: Diagnosis not present

## 2022-07-12 DIAGNOSIS — R0683 Snoring: Secondary | ICD-10-CM | POA: Diagnosis not present

## 2022-07-12 DIAGNOSIS — E039 Hypothyroidism, unspecified: Secondary | ICD-10-CM | POA: Diagnosis not present

## 2022-07-12 DIAGNOSIS — R778 Other specified abnormalities of plasma proteins: Secondary | ICD-10-CM | POA: Diagnosis not present

## 2022-07-12 DIAGNOSIS — I129 Hypertensive chronic kidney disease with stage 1 through stage 4 chronic kidney disease, or unspecified chronic kidney disease: Secondary | ICD-10-CM | POA: Diagnosis not present

## 2022-07-12 DIAGNOSIS — M1990 Unspecified osteoarthritis, unspecified site: Secondary | ICD-10-CM | POA: Diagnosis not present

## 2022-07-12 DIAGNOSIS — E785 Hyperlipidemia, unspecified: Secondary | ICD-10-CM | POA: Diagnosis not present

## 2022-07-12 DIAGNOSIS — Z83719 Family history of colon polyps, unspecified: Secondary | ICD-10-CM | POA: Diagnosis not present

## 2022-07-19 ENCOUNTER — Other Ambulatory Visit: Payer: Self-pay | Admitting: Physician Assistant

## 2022-07-19 DIAGNOSIS — D472 Monoclonal gammopathy: Secondary | ICD-10-CM

## 2022-07-20 ENCOUNTER — Inpatient Hospital Stay (HOSPITAL_BASED_OUTPATIENT_CLINIC_OR_DEPARTMENT_OTHER): Payer: Medicare HMO | Admitting: Physician Assistant

## 2022-07-20 ENCOUNTER — Inpatient Hospital Stay: Payer: Medicare HMO | Attending: Physician Assistant

## 2022-07-20 VITALS — BP 173/90 | HR 69 | Temp 97.2°F | Resp 13 | Wt 224.4 lb

## 2022-07-20 DIAGNOSIS — Z888 Allergy status to other drugs, medicaments and biological substances status: Secondary | ICD-10-CM | POA: Diagnosis not present

## 2022-07-20 DIAGNOSIS — Z83719 Family history of colon polyps, unspecified: Secondary | ICD-10-CM | POA: Diagnosis not present

## 2022-07-20 DIAGNOSIS — Z8616 Personal history of COVID-19: Secondary | ICD-10-CM | POA: Diagnosis not present

## 2022-07-20 DIAGNOSIS — D472 Monoclonal gammopathy: Secondary | ICD-10-CM | POA: Diagnosis not present

## 2022-07-20 DIAGNOSIS — Z803 Family history of malignant neoplasm of breast: Secondary | ICD-10-CM | POA: Diagnosis not present

## 2022-07-20 DIAGNOSIS — K5900 Constipation, unspecified: Secondary | ICD-10-CM | POA: Diagnosis not present

## 2022-07-20 DIAGNOSIS — R5383 Other fatigue: Secondary | ICD-10-CM | POA: Insufficient documentation

## 2022-07-20 DIAGNOSIS — Z8249 Family history of ischemic heart disease and other diseases of the circulatory system: Secondary | ICD-10-CM | POA: Diagnosis not present

## 2022-07-20 DIAGNOSIS — Z79899 Other long term (current) drug therapy: Secondary | ICD-10-CM | POA: Diagnosis not present

## 2022-07-20 LAB — CMP (CANCER CENTER ONLY)
ALT: 15 U/L (ref 0–44)
AST: 20 U/L (ref 15–41)
Albumin: 4.3 g/dL (ref 3.5–5.0)
Alkaline Phosphatase: 57 U/L (ref 38–126)
Anion gap: 6 (ref 5–15)
BUN: 15 mg/dL (ref 8–23)
CO2: 30 mmol/L (ref 22–32)
Calcium: 9 mg/dL (ref 8.9–10.3)
Chloride: 99 mmol/L (ref 98–111)
Creatinine: 1.33 mg/dL — ABNORMAL HIGH (ref 0.61–1.24)
GFR, Estimated: 56 mL/min — ABNORMAL LOW (ref >60)
Glucose, Bld: 101 mg/dL — ABNORMAL HIGH (ref 70–99)
Potassium: 4.5 mmol/L (ref 3.5–5.1)
Sodium: 135 mmol/L (ref 135–145)
Total Bilirubin: 0.5 mg/dL (ref 0.3–1.2)
Total Protein: 7.6 g/dL (ref 6.5–8.1)

## 2022-07-20 LAB — CBC WITH DIFFERENTIAL (CANCER CENTER ONLY)
Abs Immature Granulocytes: 0.01 10*3/uL (ref 0.00–0.07)
Basophils Absolute: 0.1 10*3/uL (ref 0.0–0.1)
Basophils Relative: 1 %
Eosinophils Absolute: 0.2 10*3/uL (ref 0.0–0.5)
Eosinophils Relative: 3 %
HCT: 42.1 % (ref 39.0–52.0)
Hemoglobin: 14.8 g/dL (ref 13.0–17.0)
Immature Granulocytes: 0 %
Lymphocytes Relative: 35 %
Lymphs Abs: 2.7 10*3/uL (ref 0.7–4.0)
MCH: 31.8 pg (ref 26.0–34.0)
MCHC: 35.2 g/dL (ref 30.0–36.0)
MCV: 90.5 fL (ref 80.0–100.0)
Monocytes Absolute: 0.7 10*3/uL (ref 0.1–1.0)
Monocytes Relative: 9 %
Neutro Abs: 4 10*3/uL (ref 1.7–7.7)
Neutrophils Relative %: 52 %
Platelet Count: 302 10*3/uL (ref 150–400)
RBC: 4.65 MIL/uL (ref 4.22–5.81)
RDW: 13 % (ref 11.5–15.5)
WBC Count: 7.6 10*3/uL (ref 4.0–10.5)
nRBC: 0 % (ref 0.0–0.2)

## 2022-07-20 NOTE — Progress Notes (Signed)
[place] [phone]   [phone]  PROGRESS NOTE  Patient Care Team: Prince Solian, MD as PCP - General (Internal Medicine)  Hematological/Oncological History 1) Labs from PCP, Dr. Prince Solian: -09/25/2021: Creatinine 1.8, Bun 26 -10/26/2021: Random Urine-no M-spike detected. SPEP-no M-spoke, IFE showed IgM monoclonal protein. Creatinine 1.2 -01/07/2022: WBC 8.61, Hgb 13.8, Plt 238, Creatinine 1.3, Calcium 9.0  2) 01/15/2022: Establish care with [facility] with Dr. Narda Rutherford and Dede Query PA-C  CHIEF COMPLAINTS/PURPOSE OF CONSULTATION:  IgM MGUS  HISTORY OF PRESENTING ILLNESS:  Bryan Thompson 72 y.o. male returns for a follow up for IgM MGUS. He is accompanied by his wife for this visit.   On exam today, [name] reports stable energy levels. He does have fatigue which can interfere with his activities such as yardwork. He reports his appetite and energy levels are stable. He denies nausae, vomiting or abdominal pain.He does have some constipation that requires stool softeners. He denies easy bruising or signs of active bleeding.  Patient denies any bone pain including back or hip pain.  He denies fevers, chills, night sweats, shortness of breath, chest pain or cough.  He has no other complaints.  Rest of the 10 point ROS is below.  MEDICAL HISTORY:  Past Medical History:  Diagnosis Date   COVID-19    Depression    Hernia, umbilical    Hyperlipidemia    Hypertension    Thyroid disease     SURGICAL HISTORY: Past Surgical History:  Procedure Laterality Date   CATARACT EXTRACTION     TONSILLECTOMY      SOCIAL HISTORY: Social History   Socioeconomic History   Marital status: Married    Spouse name: Not on file   Number of children: Not on file   Years of education: Not on file   Highest education level: Not on file  Occupational History   Not on file  Tobacco Use   Smoking status: Never   Smokeless tobacco:  Never  Vaping Use   Vaping Use: Never used  Substance and Sexual Activity   Alcohol use: Not Currently   Drug use: Never   Sexual activity: Not on file  Other Topics Concern   Not on file  Social History Narrative   Not on file   Social Determinants of Health   Financial Resource Strain: Not on file  Food Insecurity: Not on file  Transportation Needs: Not on file  Physical Activity: Not on file  Stress: Not on file  Social Connections: Not on file  Intimate Partner Violence: Not on file    FAMILY HISTORY: Family History  Problem Relation Age of Onset   Cancer Mother        breast CA   Heart disease Mother    Colon polyps Father    Hypertension Father    Colon cancer Neg Hx    Esophageal cancer Neg Hx    Rectal cancer Neg Hx    Stomach cancer Neg Hx     ALLERGIES:  is allergic to fish allergy and prednisone.  MEDICATIONS:  Current Outpatient Medications  Medication Sig Dispense Refill   irbesartan (AVAPRO) 150 MG tablet Take 0.5 tablets by mouth daily.     levothyroxine (SYNTHROID, LEVOTHROID) 150 MCG tablet Take 150 mcg by mouth daily before breakfast.     meloxicam (MOBIC) 15 MG tablet As needed     Multiple Vitamins-Minerals (CENTRUM SILVER PO) Take by mouth.     sertraline (ZOLOFT) 100 MG  tablet Take 100 mg by mouth daily.     vitamin B-12 (CYANOCOBALAMIN) 1000 MCG tablet Take 1,000 mcg by mouth daily.     azithromycin (ZITHROMAX) 250 MG tablet Take 1 tablet (250 mg total) by mouth daily. Take first 2 tablets together, then 1 every day until finished. (Patient not taking: Reported on 07/20/2022) 6 tablet 0   No current facility-administered medications for this visit.    REVIEW OF SYSTEMS:   Constitutional: ( - ) fevers, ( - )  chills , ( - ) night sweats Eyes: ( - ) blurriness of vision, ( - ) double vision, ( - ) watery eyes Ears, nose, mouth, throat, and face: ( - ) mucositis, ( - ) sore throat Respiratory: ( - ) cough, ( - ) dyspnea, ( - )  wheezes Cardiovascular: ( - ) palpitation, ( - ) chest discomfort, ( - ) lower extremity swelling Gastrointestinal:  ( - ) nausea, ( - ) heartburn, ( - ) change in bowel habits Skin: ( - ) abnormal skin rashes Lymphatics: ( - ) new lymphadenopathy, ( - ) easy bruising Neurological: ( - ) numbness, ( - ) tingling, ( - ) new weaknesses Behavioral/Psych: ( - ) mood change, ( - ) new changes  All other systems were reviewed with the patient and are negative.  PHYSICAL EXAMINATION: ECOG PERFORMANCE STATUS: 0 - Asymptomatic  Vitals:   07/20/22 1035  BP: (!) 173/90  Pulse: 69  Resp: 13  Temp: (!) 97.2 F (36.2 C)  SpO2: 95%    Filed Weights   07/20/22 1035  Weight: 224 lb 6.4 oz (101.8 kg)     GENERAL: well appearing male in NAD  SKIN: skin color, texture, turgor are normal, no rashes or significant lesions EYES: conjunctiva are pink and non-injected, sclera clear LUNGS: clear to auscultation and percussion with normal breathing effort HEART: regular rate & rhythm and no murmurs and no lower extremity edema Musculoskeletal: no cyanosis of digits and no clubbing  PSYCH: alert & oriented x 3, fluent speech NEURO: no focal motor/sensory deficits  LABORATORY DATA:  I have reviewed the data as listed    Latest Ref Rng & Units 07/20/2022    9:31 AM 01/15/2022   12:23 PM  CBC  WBC 4.0 - 10.5 K/uL 7.6  7.0   Hemoglobin 13.0 - 17.0 g/dL 14.8  13.9   Hematocrit 39.0 - 52.0 % 42.1  39.3   Platelets 150 - 400 K/uL 302  287        Latest Ref Rng & Units 07/20/2022    9:31 AM 01/15/2022   12:23 PM  CMP  Glucose 70 - 99 mg/dL 101  100   BUN 8 - 23 mg/dL 15  16   Creatinine 0.61 - 1.24 mg/dL 1.33  1.20   Sodium 135 - 145 mmol/L 135  136   Potassium 3.5 - 5.1 mmol/L 4.5  4.4   Chloride 98 - 111 mmol/L 99  101   CO2 22 - 32 mmol/L 30  29   Calcium 8.9 - 10.3 mg/dL 9.0  9.7   Total Protein 6.5 - 8.1 g/dL 7.6  7.3   Total Bilirubin 0.3 - 1.2 mg/dL 0.5  0.5   Alkaline Phos 38 - 126  U/L 57  57   AST 15 - 41 U/L 20  17   ALT 0 - 44 U/L 15  11     ASSESSMENT & PLAN Avontay Kreinbrink is a 72 y.o. male who  presents for a follow up for MGUS.    #IgM MGUS: --Workup from 01/15/2022 showed no measurable M-protein but immunofixation showed IgM monoclonal protein with lambda light chain specificity. Serum free light chains showed kappa 26.4, lambda 18.6, ratio 1.42.  --Bone survey from 01/18/2022 showed no lytic lesions. Repeat yearly --24 hour UPEP from 01/18/2022 was unremarkable. Repeat yearly. --Labs today show CBC is unremarkable without cytopenias. CMP showed creatinine 1.33, normal calcium levels. SPEP/IFE and sFLC pending today --No clinical symptoms to suggest transformation to multiple myeloma --If M-spike is detected with IgM protein, then need to arrange for bone marrow biopsy  --Tentative plan to return in 1 year with labs and follow up.   Orders Placed This Encounter  Procedures   DG Bone Survey Met    Standing Status:   Future    Standing Expiration Date:   07/21/2023    Order Specific Question:   Reason for Exam (SYMPTOM  OR DIAGNOSIS REQUIRED)    Answer:   evaluate for lytic lesions.    Order Specific Question:   Preferred imaging location?    Answer:   [facility]   24-Hr Ur UPEP/UIFE/Light Chains/TP    Standing Status:   Future    Standing Expiration Date:   07/20/2023    All questions were answered. The patient knows to call the clinic with any problems, questions or concerns.  I have spent a total of 30 minutes minutes of face-to-face and non-face-to-face time, preparing to see the patient, performing a medically appropriate examination, counseling and educating the patient, documenting clinical information in the electronic health record, and care coordination.   Dede Query, PA-C Department of Hematology/Oncology [place] at [facility] Phone: [phone]

## 2022-07-21 LAB — KAPPA/LAMBDA LIGHT CHAINS
Kappa free light chain: 31 mg/L — ABNORMAL HIGH (ref 3.3–19.4)
Kappa, lambda light chain ratio: 1.57 (ref 0.26–1.65)
Lambda free light chains: 19.7 mg/L (ref 5.7–26.3)

## 2022-07-27 LAB — MULTIPLE MYELOMA PANEL, SERUM
Albumin SerPl Elph-Mcnc: 4 g/dL (ref 2.9–4.4)
Albumin/Glob SerPl: 1.3 (ref 0.7–1.7)
Alpha 1: 0.1 g/dL (ref 0.0–0.4)
Alpha2 Glob SerPl Elph-Mcnc: 0.8 g/dL (ref 0.4–1.0)
B-Globulin SerPl Elph-Mcnc: 1.2 g/dL (ref 0.7–1.3)
Gamma Glob SerPl Elph-Mcnc: 1.2 g/dL (ref 0.4–1.8)
Globulin, Total: 3.3 g/dL (ref 2.2–3.9)
IgA: 378 mg/dL (ref 61–437)
IgG (Immunoglobin G), Serum: 1041 mg/dL (ref 603–1613)
IgM (Immunoglobulin M), Srm: 182 mg/dL — ABNORMAL HIGH (ref 15–143)
Total Protein ELP: 7.3 g/dL (ref 6.0–8.5)

## 2022-08-05 DIAGNOSIS — L821 Other seborrheic keratosis: Secondary | ICD-10-CM | POA: Diagnosis not present

## 2022-08-05 DIAGNOSIS — D225 Melanocytic nevi of trunk: Secondary | ICD-10-CM | POA: Diagnosis not present

## 2022-08-05 DIAGNOSIS — L905 Scar conditions and fibrosis of skin: Secondary | ICD-10-CM | POA: Diagnosis not present

## 2022-08-05 DIAGNOSIS — Z85828 Personal history of other malignant neoplasm of skin: Secondary | ICD-10-CM | POA: Diagnosis not present

## 2022-08-05 DIAGNOSIS — L570 Actinic keratosis: Secondary | ICD-10-CM | POA: Diagnosis not present

## 2022-08-05 DIAGNOSIS — D692 Other nonthrombocytopenic purpura: Secondary | ICD-10-CM | POA: Diagnosis not present

## 2022-08-05 DIAGNOSIS — L720 Epidermal cyst: Secondary | ICD-10-CM | POA: Diagnosis not present

## 2022-08-09 ENCOUNTER — Telehealth: Payer: Self-pay

## 2022-08-09 NOTE — Telephone Encounter (Signed)
-----   Message from Lincoln Brigham, PA-C sent at 07/30/2022  6:40 AM EST ----- Please notify patient that his protein levels are stable. We can continue to monitor and see him back in 6 months.   ----- Message ----- From: Interface, Lab In [place] Sent: 07/20/2022   9:55 AM EST To: Lincoln Brigham, PA-C

## 2022-08-09 NOTE — Telephone Encounter (Signed)
Pt advised of lab results and since he was told at his last OV he will wait and return in 1 year.

## 2022-08-25 DIAGNOSIS — Z0100 Encounter for examination of eyes and vision without abnormal findings: Secondary | ICD-10-CM | POA: Diagnosis not present

## 2022-09-27 ENCOUNTER — Ambulatory Visit
Admission: RE | Admit: 2022-09-27 | Discharge: 2022-09-27 | Disposition: A | Discharge status: Home / Self Care | Payer: Medicare HMO | Source: Ambulatory Visit | Attending: Emergency Medicine | Admitting: Emergency Medicine

## 2022-09-27 VITALS — BP 151/79 | HR 76 | Temp 98.2°F | Ht 70.0 in | Wt 215.0 lb

## 2022-09-27 DIAGNOSIS — M791 Myalgia, unspecified site: Secondary | ICD-10-CM | POA: Diagnosis not present

## 2022-09-27 DIAGNOSIS — L03221 Cellulitis of neck: Secondary | ICD-10-CM

## 2022-09-27 LAB — POC SARS CORONAVIRUS 2 AG -  ED: SARS Coronavirus 2 Ag: NEGATIVE

## 2022-09-27 MED ORDER — DOXYCYCLINE HYCLATE 100 MG PO CAPS: 100.0000 mg | ORAL_CAPSULE | Freq: Two times a day (BID) | ORAL | 0 refills | Status: AC

## 2022-09-27 NOTE — ED Provider Notes (Signed)
HPI  SUBJECTIVE:  Bryan Thompson is a 72 y.o. male who presents with flulike symptoms of fatigue, headaches, body aches, myalgias for the past 3 days.  He also reports pustules on his face 3 days ago, which have resolved and a painful swelling on his right neck today.  He states this feels identical to when he was treated for tick bites twice.  He had a negative home COVID test yesterday.  He has not found any ticks on him recently, but [place] and [place] a lot of work outside.  No fevers, nasal congestion, rhinorrhea, postnasal drip, cough, wheeze, shortness of breath, chest pain, nausea, vomiting, diarrhea, abdominal pain, photophobia, neck stiffness, rash.  He lost his sense of smell years ago.  No known COVID or flu exposure.  He got 2 doses of the COVID-vaccine.  Did not get this years flu vaccine.  He has been putting bacitracin on his pimples and has tried squeezing them without improvement in his symptoms.  He has also been taking Tylenol and meloxicam with improvement in his myalgias, headache.  No aggravating factors.  No antibiotics in the past month.  No antipyretic in the past 6 hours.  He has a past medical history of 2 tick bites, 1 a deer tick, the other [place] tick.  No formal diagnosis of RMSF, Lyme disease or other tickborne diseases.  He also has MGUS, followed by heme-onc, hypertension hypercholesterolemia, Chronic kidney disease stage IIIa.  Past Medical History:  Diagnosis Date   COVID-19    Depression    Hernia, umbilical    Hyperlipidemia    Hypertension    Thyroid disease     Past Surgical History:  Procedure Laterality Date   CATARACT EXTRACTION     TONSILLECTOMY      Family History  Problem Relation Age of Onset   Cancer Mother        breast CA   Heart disease Mother    Colon polyps Father    Hypertension Father    Colon cancer Neg Hx    Esophageal cancer Neg Hx    Rectal cancer Neg Hx    Stomach cancer Neg Hx     Social History   Tobacco  Use   Smoking status: Never   Smokeless tobacco: Never  Vaping Use   Vaping Use: Never used  Substance Use Topics   Alcohol use: Not Currently   Drug use: Never    No current facility-administered medications for this encounter.  Current Outpatient Medications:    doxycycline (VIBRAMYCIN) 100 MG capsule, Take 1 capsule (100 mg total) by mouth 2 (two) times daily for 10 days., Disp: 20 capsule, Rfl: 0   irbesartan (AVAPRO) 150 MG tablet, Take 0.5 tablets by mouth daily., Disp: , Rfl:    levothyroxine (SYNTHROID, LEVOTHROID) 150 MCG tablet, Take 150 mcg by mouth daily before breakfast., Disp: , Rfl:    Multiple Vitamins-Minerals (CENTRUM SILVER PO), Take by mouth., Disp: , Rfl:    sertraline (ZOLOFT) 100 MG tablet, Take 100 mg by mouth daily., Disp: , Rfl:    vitamin B-12 (CYANOCOBALAMIN) 1000 MCG tablet, Take 1,000 mcg by mouth daily., Disp: , Rfl:   Allergies  Allergen Reactions   Fish Allergy    Prednisone Anxiety     ROS  As noted in HPI.   Physical Exam  BP (!) 151/79 (BP Location: Left Arm)   Pulse 76   Temp 98.2 F (36.8 C) (Oral)   Ht 5'  10" (1.778 m)   Wt 97.5 kg   SpO2 94%   BMI 30.85 kg/m   Constitutional: Well developed, well nourished, no acute distress Eyes:  EOMI, conjunctiva normal bilaterally HENT: Normocephalic, atraumatic,mucus membranes moist.  No nasal congestion.  No maxillary, frontal sinus tenderness.  No pustules on the face. Neck: Positive tender, swollen mass along right neck consistent with an infected hair follicle/small abscess versus ingrown hair.  No appreciable embedded tick.  No other appreciable cervical lymphadenopathy.  No expressible purulent drainage.  No meningismus. Respiratory: Normal inspiratory effort, lungs clear bilaterally Cardiovascular: Normal rate, regular rhythm, no murmurs, rubs, gallops GI: nondistended skin: No rash, including on the palms of his hands, skin intact Musculoskeletal: no deformities Neurologic: Alert  & oriented x 3, no focal neuro deficits Psychiatric: Speech and behavior appropriate   ED Course   Medications - No data to display  Orders Placed This Encounter  Procedures   Lyme Disease Serology w/Reflex    Standing Status:   Standing    Number of Occurrences:   1   Spotted Fever Group Antibodies    Standing Status:   Standing    Number of Occurrences:   1   POC SARS Coronavirus 2 Ag-ED - Nasal Swab    Standing Status:   Standing    Number of Occurrences:   1    Results for orders placed or performed during the hospital encounter of 09/27/22 (from the past 24 hour(s))  POC SARS Coronavirus 2 Ag-ED - Nasal Swab     Status: None   Collection Time: 09/27/22  8:02 PM  Result Value Ref Range   SARS Coronavirus 2 Ag Negative Negative   No results found.  ED Clinical Impression  1. Myalgia   2. Cellulitis of neck      ED Assessment/Plan      Patient had a negative home COVID test last night and another negative rapid COVID test here.  Did not check for flu as he is out of the treatment window.  I suspect that he has a small abscess versus ingrown hair on his neck, so we will send him home with doxycycline and warm compresses.  There does not appear to be anything I&D today.  Will check for [facility] spotted fever and Lyme disease, however, will start him on doxycycline now for 10 days.  Extend to 3 weeks if Lyme titers come back positive.  Discontinue meloxicam, start Tylenol 1000 mg 3-4 times a day as needed for pain.  Follow-up with PCP as needed.  Strict ER return precautions given to patient and significant other.   Meds ordered this encounter  Medications   doxycycline (VIBRAMYCIN) 100 MG capsule    Sig: Take 1 capsule (100 mg total) by mouth 2 (two) times daily for 10 days.    Dispense:  20 capsule    Refill:  0      *This clinic note was created using Scientist, clinical (histocompatibility and immunogenetics). Therefore, there may be occasional mistakes despite careful  proofreading.  ?    Domenick Gong, MD 09/27/22 2022

## 2022-09-27 NOTE — ED Triage Notes (Signed)
Patient's wife c/o body aches, puss-filled pimples on his face x 2 days.  Now he has a swollen lymph node on the right side.  Headache, joints aches, possible tick-bite.  Patient has taken Tylenol and Meloxicam.

## 2022-09-27 NOTE — Discharge Instructions (Signed)
Your COVID test is negative.  Warm compresses to your neck, finish the doxycycline, even if you feel better.  We will extend your doxycycline out to 3 weeks if your Lyme titers come back positive.  Follow-up with your primary care provider as needed.  Go to the ER for the signs and symptoms were discussed.

## 2022-09-29 ENCOUNTER — Inpatient Hospital Stay (HOSPITAL_COMMUNITY): Payer: Medicare HMO

## 2022-09-29 ENCOUNTER — Emergency Department (HOSPITAL_BASED_OUTPATIENT_CLINIC_OR_DEPARTMENT_OTHER): Payer: Medicare HMO

## 2022-09-29 ENCOUNTER — Encounter (HOSPITAL_COMMUNITY)
Admission: EM | Disposition: A | Discharge status: Home / Self Care | Payer: Self-pay | Source: Home / Self Care | Attending: Internal Medicine

## 2022-09-29 ENCOUNTER — Inpatient Hospital Stay (HOSPITAL_BASED_OUTPATIENT_CLINIC_OR_DEPARTMENT_OTHER)
Admission: EM | Admit: 2022-09-29 | Discharge: 2022-10-01 | DRG: 322 | Disposition: A | Discharge status: Home / Self Care | Payer: Medicare HMO | Attending: Internal Medicine | Admitting: Internal Medicine

## 2022-09-29 ENCOUNTER — Encounter (HOSPITAL_BASED_OUTPATIENT_CLINIC_OR_DEPARTMENT_OTHER): Payer: Self-pay

## 2022-09-29 ENCOUNTER — Other Ambulatory Visit: Payer: Self-pay

## 2022-09-29 DIAGNOSIS — Z8249 Family history of ischemic heart disease and other diseases of the circulatory system: Secondary | ICD-10-CM | POA: Diagnosis not present

## 2022-09-29 DIAGNOSIS — I319 Disease of pericardium, unspecified: Secondary | ICD-10-CM | POA: Diagnosis present

## 2022-09-29 DIAGNOSIS — E039 Hypothyroidism, unspecified: Secondary | ICD-10-CM | POA: Diagnosis present

## 2022-09-29 DIAGNOSIS — R7989 Other specified abnormal findings of blood chemistry: Secondary | ICD-10-CM | POA: Diagnosis not present

## 2022-09-29 DIAGNOSIS — Z9849 Cataract extraction status, unspecified eye: Secondary | ICD-10-CM

## 2022-09-29 DIAGNOSIS — Z66 Do not resuscitate: Secondary | ICD-10-CM | POA: Diagnosis present

## 2022-09-29 DIAGNOSIS — I2121 ST elevation (STEMI) myocardial infarction involving left circumflex coronary artery: Secondary | ICD-10-CM | POA: Diagnosis not present

## 2022-09-29 DIAGNOSIS — Z79899 Other long term (current) drug therapy: Secondary | ICD-10-CM

## 2022-09-29 DIAGNOSIS — L0292 Furuncle, unspecified: Secondary | ICD-10-CM | POA: Diagnosis present

## 2022-09-29 DIAGNOSIS — D472 Monoclonal gammopathy: Secondary | ICD-10-CM | POA: Diagnosis present

## 2022-09-29 DIAGNOSIS — I249 Acute ischemic heart disease, unspecified: Secondary | ICD-10-CM | POA: Diagnosis not present

## 2022-09-29 DIAGNOSIS — D649 Anemia, unspecified: Secondary | ICD-10-CM | POA: Diagnosis present

## 2022-09-29 DIAGNOSIS — Z79890 Hormone replacement therapy (postmenopausal): Secondary | ICD-10-CM

## 2022-09-29 DIAGNOSIS — E785 Hyperlipidemia, unspecified: Secondary | ICD-10-CM | POA: Diagnosis present

## 2022-09-29 DIAGNOSIS — E871 Hypo-osmolality and hyponatremia: Secondary | ICD-10-CM | POA: Diagnosis not present

## 2022-09-29 DIAGNOSIS — R739 Hyperglycemia, unspecified: Secondary | ICD-10-CM | POA: Diagnosis present

## 2022-09-29 DIAGNOSIS — Z7982 Long term (current) use of aspirin: Secondary | ICD-10-CM

## 2022-09-29 DIAGNOSIS — E669 Obesity, unspecified: Secondary | ICD-10-CM | POA: Diagnosis present

## 2022-09-29 DIAGNOSIS — E782 Mixed hyperlipidemia: Secondary | ICD-10-CM

## 2022-09-29 DIAGNOSIS — Z7902 Long term (current) use of antithrombotics/antiplatelets: Secondary | ICD-10-CM

## 2022-09-29 DIAGNOSIS — M25511 Pain in right shoulder: Secondary | ICD-10-CM | POA: Diagnosis present

## 2022-09-29 DIAGNOSIS — I471 Supraventricular tachycardia, unspecified: Secondary | ICD-10-CM

## 2022-09-29 DIAGNOSIS — R9431 Abnormal electrocardiogram [ECG] [EKG]: Secondary | ICD-10-CM | POA: Diagnosis not present

## 2022-09-29 DIAGNOSIS — N182 Chronic kidney disease, stage 2 (mild): Secondary | ICD-10-CM | POA: Diagnosis not present

## 2022-09-29 DIAGNOSIS — Z8616 Personal history of COVID-19: Secondary | ICD-10-CM

## 2022-09-29 DIAGNOSIS — R5382 Chronic fatigue, unspecified: Secondary | ICD-10-CM | POA: Diagnosis not present

## 2022-09-29 DIAGNOSIS — I10 Essential (primary) hypertension: Secondary | ICD-10-CM | POA: Diagnosis present

## 2022-09-29 DIAGNOSIS — F32A Depression, unspecified: Secondary | ICD-10-CM | POA: Diagnosis present

## 2022-09-29 DIAGNOSIS — I2510 Atherosclerotic heart disease of native coronary artery without angina pectoris: Secondary | ICD-10-CM | POA: Insufficient documentation

## 2022-09-29 DIAGNOSIS — R5383 Other fatigue: Secondary | ICD-10-CM | POA: Diagnosis present

## 2022-09-29 DIAGNOSIS — R519 Headache, unspecified: Secondary | ICD-10-CM | POA: Diagnosis present

## 2022-09-29 DIAGNOSIS — I700 Atherosclerosis of aorta: Secondary | ICD-10-CM | POA: Diagnosis present

## 2022-09-29 DIAGNOSIS — I129 Hypertensive chronic kidney disease with stage 1 through stage 4 chronic kidney disease, or unspecified chronic kidney disease: Secondary | ICD-10-CM | POA: Diagnosis present

## 2022-09-29 DIAGNOSIS — Z6832 Body mass index (BMI) 32.0-32.9, adult: Secondary | ICD-10-CM | POA: Diagnosis not present

## 2022-09-29 DIAGNOSIS — I219 Acute myocardial infarction, unspecified: Secondary | ICD-10-CM

## 2022-09-29 DIAGNOSIS — C9000 Multiple myeloma not having achieved remission: Secondary | ICD-10-CM | POA: Diagnosis present

## 2022-09-29 DIAGNOSIS — F419 Anxiety disorder, unspecified: Secondary | ICD-10-CM | POA: Diagnosis not present

## 2022-09-29 DIAGNOSIS — Z955 Presence of coronary angioplasty implant and graft: Secondary | ICD-10-CM

## 2022-09-29 DIAGNOSIS — Z91013 Allergy to seafood: Secondary | ICD-10-CM

## 2022-09-29 DIAGNOSIS — I214 Non-ST elevation (NSTEMI) myocardial infarction: Secondary | ICD-10-CM | POA: Diagnosis not present

## 2022-09-29 DIAGNOSIS — Z888 Allergy status to other drugs, medicaments and biological substances status: Secondary | ICD-10-CM

## 2022-09-29 DIAGNOSIS — M25512 Pain in left shoulder: Secondary | ICD-10-CM | POA: Diagnosis present

## 2022-09-29 HISTORY — PX: CORONARY THROMBECTOMY: CATH118304

## 2022-09-29 HISTORY — PX: CORONARY STENT INTERVENTION: CATH118234

## 2022-09-29 HISTORY — DX: Acute myocardial infarction, unspecified: I21.9

## 2022-09-29 HISTORY — PX: LEFT HEART CATH AND CORONARY ANGIOGRAPHY: CATH118249

## 2022-09-29 LAB — ECHOCARDIOGRAM COMPLETE
AR max vel: 1.69 cm2
AV Area VTI: 1.8 cm2
AV Area mean vel: 1.78 cm2
AV Mean grad: 6.3 mmHg
AV Peak grad: 11.5 mmHg
Ao pk vel: 1.69 m/s
Area-P 1/2: 4.04 cm2
Calc EF: 61.2 %
Height: 70 in
MV VTI: 1.85 cm2
S' Lateral: 2.9 cm
Single Plane A2C EF: 55.4 %
Single Plane A4C EF: 65.6 %
Weight: 3440 oz

## 2022-09-29 LAB — SPOTTED FEVER GROUP ANTIBODIES
Spotted Fever Group IgG: <1:64 {titer}
Spotted Fever Group IgM: <1:64 {titer}

## 2022-09-29 LAB — TSH: TSH: 1.187 u[IU]/mL (ref 0.350–4.500)

## 2022-09-29 LAB — TROPONIN I (HIGH SENSITIVITY)
Troponin I (High Sensitivity): 7851 ng/L (ref <18)
Troponin I (High Sensitivity): 7207 ng/L (ref <18)
Troponin I (High Sensitivity): 5790 ng/L (ref <18)
Troponin I (High Sensitivity): 6560 ng/L (ref <18)

## 2022-09-29 LAB — LYME DISEASE SEROLOGY W/REFLEX: Lyme Total Antibody EIA: NEGATIVE

## 2022-09-29 LAB — CBC WITH DIFFERENTIAL/PLATELET
Abs Immature Granulocytes: 0.06 10*3/uL (ref 0.00–0.07)
Basophils Absolute: 0 10*3/uL (ref 0.0–0.1)
Basophils Relative: 0 %
Eosinophils Absolute: 0.2 10*3/uL (ref 0.0–0.5)
Eosinophils Relative: 1 %
HCT: 33.7 % — ABNORMAL LOW (ref 39.0–52.0)
Hemoglobin: 11.7 g/dL — ABNORMAL LOW (ref 13.0–17.0)
Immature Granulocytes: 0 %
Lymphocytes Relative: 9 %
Lymphs Abs: 1.3 10*3/uL (ref 0.7–4.0)
MCH: 31.5 pg (ref 26.0–34.0)
MCHC: 34.7 g/dL (ref 30.0–36.0)
MCV: 90.8 fL (ref 80.0–100.0)
Monocytes Absolute: 1.4 10*3/uL — ABNORMAL HIGH (ref 0.1–1.0)
Monocytes Relative: 10 %
Neutro Abs: 10.4 10*3/uL — ABNORMAL HIGH (ref 1.7–7.7)
Neutrophils Relative %: 80 %
Platelets: 263 10*3/uL (ref 150–400)
RBC: 3.71 MIL/uL — ABNORMAL LOW (ref 4.22–5.81)
RDW: 12.7 % (ref 11.5–15.5)
WBC: 13.3 10*3/uL — ABNORMAL HIGH (ref 4.0–10.5)
nRBC: 0 % (ref 0.0–0.2)

## 2022-09-29 LAB — URINALYSIS, ROUTINE W REFLEX MICROSCOPIC
Bilirubin Urine: NEGATIVE
Glucose, UA: NEGATIVE mg/dL
Ketones, ur: NEGATIVE mg/dL
Leukocytes,Ua: NEGATIVE
Nitrite: NEGATIVE
Protein, ur: NEGATIVE mg/dL
Specific Gravity, Urine: 1.01 (ref 1.005–1.030)
pH: 6 (ref 5.0–8.0)

## 2022-09-29 LAB — COMPREHENSIVE METABOLIC PANEL
ALT: 28 U/L (ref 0–44)
AST: 39 U/L (ref 15–41)
Albumin: 3.2 g/dL — ABNORMAL LOW (ref 3.5–5.0)
Alkaline Phosphatase: 64 U/L (ref 38–126)
Anion gap: 9 (ref 5–15)
BUN: 13 mg/dL (ref 8–23)
CO2: 22 mmol/L (ref 22–32)
Calcium: 8.4 mg/dL — ABNORMAL LOW (ref 8.9–10.3)
Chloride: 96 mmol/L — ABNORMAL LOW (ref 98–111)
Creatinine, Ser: 1.21 mg/dL (ref 0.61–1.24)
GFR, Estimated: >60 mL/min (ref >60)
Glucose, Bld: 160 mg/dL — ABNORMAL HIGH (ref 70–99)
Potassium: 4 mmol/L (ref 3.5–5.1)
Sodium: 127 mmol/L — ABNORMAL LOW (ref 135–145)
Total Bilirubin: 0.4 mg/dL (ref 0.3–1.2)
Total Protein: 7.2 g/dL (ref 6.5–8.1)

## 2022-09-29 LAB — CREATININE, SERUM
Creatinine, Ser: 1.21 mg/dL (ref 0.61–1.24)
GFR, Estimated: >60 mL/min (ref >60)

## 2022-09-29 LAB — POCT ACTIVATED CLOTTING TIME
Activated Clotting Time: 342 seconds
Activated Clotting Time: 428 seconds
Activated Clotting Time: 255 seconds
Activated Clotting Time: 320 seconds

## 2022-09-29 LAB — LIPID PANEL
Cholesterol: 133 mg/dL (ref 0–200)
HDL: 29 mg/dL — ABNORMAL LOW (ref >40)
LDL Cholesterol: 80 mg/dL (ref 0–99)
Total CHOL/HDL Ratio: 4.6 RATIO
Triglycerides: 122 mg/dL (ref <150)
VLDL: 24 mg/dL (ref 0–40)

## 2022-09-29 LAB — CBC
HCT: 32.2 % — ABNORMAL LOW (ref 39.0–52.0)
Hemoglobin: 10.8 g/dL — ABNORMAL LOW (ref 13.0–17.0)
MCH: 31.4 pg (ref 26.0–34.0)
MCHC: 33.5 g/dL (ref 30.0–36.0)
MCV: 93.6 fL (ref 80.0–100.0)
Platelets: 294 10*3/uL (ref 150–400)
RBC: 3.44 MIL/uL — ABNORMAL LOW (ref 4.22–5.81)
RDW: 13 % (ref 11.5–15.5)
WBC: 12.7 10*3/uL — ABNORMAL HIGH (ref 4.0–10.5)
nRBC: 0 % (ref 0.0–0.2)

## 2022-09-29 LAB — C-REACTIVE PROTEIN: CRP: 20.6 mg/dL — ABNORMAL HIGH (ref <1.0)

## 2022-09-29 LAB — HEMOGLOBIN A1C
Hgb A1c MFr Bld: 6.3 % — ABNORMAL HIGH (ref 4.8–5.6)
Mean Plasma Glucose: 134.11 mg/dL

## 2022-09-29 LAB — URINALYSIS, MICROSCOPIC (REFLEX)

## 2022-09-29 LAB — SEDIMENTATION RATE: Sed Rate: 91 mm/hr — ABNORMAL HIGH (ref 0–16)

## 2022-09-29 LAB — CK: Total CK: 101 U/L (ref 49–397)

## 2022-09-29 LAB — HEPARIN LEVEL (UNFRACTIONATED): Heparin Unfractionated: <0.1 IU/mL — ABNORMAL LOW (ref 0.30–0.70)

## 2022-09-29 SURGERY — LEFT HEART CATH AND CORONARY ANGIOGRAPHY
Anesthesia: LOCAL

## 2022-09-29 MED ORDER — ONDANSETRON HCL 4 MG/2ML IJ SOLN: 4.0000 mg | Freq: Four times a day (QID) | INTRAMUSCULAR | Status: DC | PRN

## 2022-09-29 MED ORDER — SODIUM CHLORIDE 0.9% FLUSH
3.0000 mL | Freq: Two times a day (BID) | INTRAVENOUS | Status: DC
  Administered 2022-09-29 – 2022-10-01 (×3): 3 mL via INTRAVENOUS

## 2022-09-29 MED ORDER — FENTANYL CITRATE (PF) 100 MCG/2ML IJ SOLN
INTRAMUSCULAR | Status: DC | PRN
  Administered 2022-09-29: 25 ug via INTRAVENOUS

## 2022-09-29 MED ORDER — SODIUM CHLORIDE 0.9% FLUSH
3.0000 mL | Freq: Two times a day (BID) | INTRAVENOUS | Status: DC
  Administered 2022-09-30 – 2022-10-01 (×2): 3 mL via INTRAVENOUS

## 2022-09-29 MED ORDER — NITROGLYCERIN 1 MG/10 ML FOR IR/CATH LAB
INTRA_ARTERIAL | Status: AC
  Filled 2022-09-29: qty 10

## 2022-09-29 MED ORDER — ASPIRIN 81 MG PO CHEW
324.0000 mg | CHEWABLE_TABLET | Freq: Once | ORAL | Status: AC
  Administered 2022-09-29: 324 mg via ORAL
  Filled 2022-09-29: qty 4

## 2022-09-29 MED ORDER — ATORVASTATIN CALCIUM 80 MG PO TABS
80.0000 mg | ORAL_TABLET | Freq: Every day | ORAL | Status: DC
  Filled 2022-09-29 (×3): qty 1

## 2022-09-29 MED ORDER — HEPARIN (PORCINE) IN NACL 1000-0.9 UT/500ML-% IV SOLN
INTRAVENOUS | Status: DC | PRN
  Administered 2022-09-29 (×2): 500 mL via INTRA_ARTERIAL

## 2022-09-29 MED ORDER — LIDOCAINE HCL (PF) 1 % IJ SOLN
INTRAMUSCULAR | Status: DC | PRN
  Administered 2022-09-29: 5 mL
  Administered 2022-09-29: 2 mL

## 2022-09-29 MED ORDER — ACETAMINOPHEN 325 MG PO TABS
650.0000 mg | ORAL_TABLET | ORAL | Status: DC | PRN
  Administered 2022-09-30: 650 mg via ORAL
  Filled 2022-09-29: qty 2

## 2022-09-29 MED ORDER — TIROFIBAN HCL IN NACL 5-0.9 MG/100ML-% IV SOLN
0.1500 ug/kg/min | INTRAVENOUS | Status: DC
  Filled 2022-09-29 (×3): qty 100

## 2022-09-29 MED ORDER — LIDOCAINE HCL (PF) 1 % IJ SOLN
INTRAMUSCULAR | Status: AC
  Filled 2022-09-29: qty 30

## 2022-09-29 MED ORDER — TIROFIBAN (AGGRASTAT) BOLUS VIA INFUSION
INTRAVENOUS | Status: DC | PRN
  Administered 2022-09-29: 2450 ug via INTRAVENOUS

## 2022-09-29 MED ORDER — DOXYCYCLINE HYCLATE 100 MG PO TABS
100.0000 mg | ORAL_TABLET | Freq: Two times a day (BID) | ORAL | Status: DC
  Administered 2022-09-29 – 2022-10-01 (×5): 100 mg via ORAL
  Filled 2022-09-29 (×5): qty 1

## 2022-09-29 MED ORDER — SODIUM CHLORIDE 0.9% FLUSH: 3.0000 mL | INTRAVENOUS | Status: DC | PRN

## 2022-09-29 MED ORDER — OXYCODONE HCL 5 MG PO TABS
5.0000 mg | ORAL_TABLET | Freq: Once | ORAL | Status: AC
  Administered 2022-09-29: 5 mg via ORAL
  Filled 2022-09-29: qty 1

## 2022-09-29 MED ORDER — TIROFIBAN HCL IN NACL 5-0.9 MG/100ML-% IV SOLN
INTRAVENOUS | Status: AC | PRN
  Administered 2022-09-29: .075 ug/kg/min via INTRAVENOUS

## 2022-09-29 MED ORDER — MORPHINE SULFATE (PF) 2 MG/ML IV SOLN: 2.0000 mg | INTRAVENOUS | Status: DC | PRN

## 2022-09-29 MED ORDER — HYDRALAZINE HCL 20 MG/ML IJ SOLN: 10.0000 mg | INTRAMUSCULAR | Status: AC | PRN

## 2022-09-29 MED ORDER — TICAGRELOR 90 MG PO TABS
ORAL_TABLET | ORAL | Status: DC | PRN
  Administered 2022-09-29: 180 mg via ORAL

## 2022-09-29 MED ORDER — TIROFIBAN HCL IN NACL 5-0.9 MG/100ML-% IV SOLN
INTRAVENOUS | Status: AC
  Filled 2022-09-29: qty 100

## 2022-09-29 MED ORDER — TICAGRELOR 90 MG PO TABS
90.0000 mg | ORAL_TABLET | Freq: Two times a day (BID) | ORAL | Status: DC
  Administered 2022-09-30: 90 mg via ORAL
  Filled 2022-09-29: qty 1

## 2022-09-29 MED ORDER — NITROGLYCERIN 1 MG/10 ML FOR IR/CATH LAB
INTRA_ARTERIAL | Status: DC | PRN
  Administered 2022-09-29 (×4): 100 ug via INTRACORONARY

## 2022-09-29 MED ORDER — VERAPAMIL HCL 2.5 MG/ML IV SOLN
INTRAVENOUS | Status: DC | PRN
  Administered 2022-09-29: 10 mL via INTRA_ARTERIAL

## 2022-09-29 MED ORDER — SODIUM CHLORIDE 0.9 % IV SOLN: 250.0000 mL | INTRAVENOUS | Status: DC | PRN

## 2022-09-29 MED ORDER — IBUPROFEN 600 MG PO TABS
600.0000 mg | ORAL_TABLET | Freq: Once | ORAL | Status: AC
  Administered 2022-09-29: 600 mg via ORAL
  Filled 2022-09-29: qty 1

## 2022-09-29 MED ORDER — HEPARIN BOLUS VIA INFUSION
3600.0000 [IU] | Freq: Once | INTRAVENOUS | Status: AC
  Administered 2022-09-29: 3600 [IU] via INTRAVENOUS

## 2022-09-29 MED ORDER — ORAL CARE MOUTH RINSE: 15.0000 mL | OROMUCOSAL | Status: DC | PRN

## 2022-09-29 MED ORDER — VERAPAMIL HCL 2.5 MG/ML IV SOLN
INTRAVENOUS | Status: AC
  Filled 2022-09-29: qty 2

## 2022-09-29 MED ORDER — MIDAZOLAM HCL 2 MG/2ML IJ SOLN
INTRAMUSCULAR | Status: AC
  Filled 2022-09-29: qty 2

## 2022-09-29 MED ORDER — PERFLUTREN LIPID MICROSPHERE
1.0000 mL | INTRAVENOUS | Status: AC | PRN
  Administered 2022-09-29: 3 mL via INTRAVENOUS

## 2022-09-29 MED ORDER — SODIUM CHLORIDE 0.9 % IV SOLN: INTRAVENOUS | Status: DC

## 2022-09-29 MED ORDER — TICAGRELOR 90 MG PO TABS
ORAL_TABLET | ORAL | Status: AC
  Filled 2022-09-29: qty 2

## 2022-09-29 MED ORDER — LABETALOL HCL 5 MG/ML IV SOLN: 10.0000 mg | INTRAVENOUS | Status: AC | PRN

## 2022-09-29 MED ORDER — ASPIRIN 81 MG PO TBEC
81.0000 mg | DELAYED_RELEASE_TABLET | Freq: Every day | ORAL | Status: DC
  Administered 2022-09-30 – 2022-10-01 (×2): 81 mg via ORAL
  Filled 2022-09-29 (×2): qty 1

## 2022-09-29 MED ORDER — SODIUM CHLORIDE 0.9 % IV SOLN: INTRAVENOUS | Status: AC

## 2022-09-29 MED ORDER — IOHEXOL 350 MG/ML SOLN
INTRAVENOUS | Status: DC | PRN
  Administered 2022-09-29: 230 mL via INTRA_ARTERIAL

## 2022-09-29 MED ORDER — SODIUM CHLORIDE 0.9% FLUSH
3.0000 mL | INTRAVENOUS | Status: DC | PRN
  Administered 2022-09-30: 3 mL via INTRAVENOUS

## 2022-09-29 MED ORDER — MORPHINE SULFATE (PF) 2 MG/ML IV SOLN
2.0000 mg | Freq: Once | INTRAVENOUS | Status: AC
  Administered 2022-09-29: 2 mg via INTRAVENOUS
  Filled 2022-09-29: qty 1

## 2022-09-29 MED ORDER — PANTOPRAZOLE SODIUM 40 MG PO TBEC
40.0000 mg | DELAYED_RELEASE_TABLET | Freq: Every day | ORAL | Status: DC
  Administered 2022-09-29 – 2022-10-01 (×2): 40 mg via ORAL
  Filled 2022-09-29 (×3): qty 1

## 2022-09-29 MED ORDER — SODIUM CHLORIDE 0.9 % IV BOLUS
1000.0000 mL | Freq: Once | INTRAVENOUS | Status: AC
  Administered 2022-09-29: 1000 mL via INTRAVENOUS

## 2022-09-29 MED ORDER — OXYCODONE HCL 5 MG PO TABS: 5.0000 mg | ORAL_TABLET | ORAL | Status: DC | PRN

## 2022-09-29 MED ORDER — LEVOTHYROXINE SODIUM 75 MCG PO TABS
150.0000 ug | ORAL_TABLET | Freq: Every day | ORAL | Status: DC
  Administered 2022-09-30 – 2022-10-01 (×2): 150 ug via ORAL
  Filled 2022-09-29 (×3): qty 2

## 2022-09-29 MED ORDER — HEPARIN SODIUM (PORCINE) 1000 UNIT/ML IJ SOLN
INTRAMUSCULAR | Status: AC
  Filled 2022-09-29: qty 10

## 2022-09-29 MED ORDER — HEPARIN SODIUM (PORCINE) 1000 UNIT/ML IJ SOLN
INTRAMUSCULAR | Status: DC | PRN
  Administered 2022-09-29: 3000 [IU] via INTRAVENOUS
  Administered 2022-09-29 (×2): 5000 [IU] via INTRAVENOUS

## 2022-09-29 MED ORDER — NITROGLYCERIN 0.4 MG SL SUBL
SUBLINGUAL_TABLET | SUBLINGUAL | Status: AC
  Filled 2022-09-29: qty 1

## 2022-09-29 MED ORDER — HEPARIN (PORCINE) 25000 UT/250ML-% IV SOLN
1150.0000 [IU]/h | INTRAVENOUS | Status: DC
  Administered 2022-09-29: 1150 [IU]/h via INTRAVENOUS
  Filled 2022-09-29: qty 250

## 2022-09-29 MED ORDER — COLCHICINE 0.6 MG PO TABS
0.6000 mg | ORAL_TABLET | Freq: Two times a day (BID) | ORAL | Status: DC
  Administered 2022-09-29 – 2022-10-01 (×4): 0.6 mg via ORAL
  Filled 2022-09-29 (×4): qty 1

## 2022-09-29 MED ORDER — FENTANYL CITRATE (PF) 100 MCG/2ML IJ SOLN
INTRAMUSCULAR | Status: AC
  Filled 2022-09-29: qty 2

## 2022-09-29 MED ORDER — MIDAZOLAM HCL 2 MG/2ML IJ SOLN
INTRAMUSCULAR | Status: DC | PRN
  Administered 2022-09-29: 2 mg via INTRAVENOUS

## 2022-09-29 MED ORDER — ACETAMINOPHEN 325 MG PO TABS: 650.0000 mg | ORAL_TABLET | ORAL | Status: DC | PRN

## 2022-09-29 MED ORDER — ENOXAPARIN SODIUM 40 MG/0.4ML IJ SOSY: 40.0000 mg | PREFILLED_SYRINGE | INTRAMUSCULAR | Status: DC

## 2022-09-29 MED ORDER — HEPARIN BOLUS VIA INFUSION
400.0000 [IU] | Freq: Once | INTRAVENOUS | Status: AC
  Administered 2022-09-29: 400 [IU] via INTRAVENOUS

## 2022-09-29 MED ORDER — NITROGLYCERIN 0.4 MG SL SUBL
0.4000 mg | SUBLINGUAL_TABLET | SUBLINGUAL | Status: DC | PRN
  Administered 2022-09-29 (×2): 0.4 mg via SUBLINGUAL
  Filled 2022-09-29 (×2): qty 1

## 2022-09-29 MED ORDER — SODIUM CHLORIDE 0.9% FLUSH
3.0000 mL | Freq: Two times a day (BID) | INTRAVENOUS | Status: DC
  Administered 2022-09-30 – 2022-10-01 (×3): 3 mL via INTRAVENOUS

## 2022-09-29 MED ORDER — SERTRALINE HCL 100 MG PO TABS
100.0000 mg | ORAL_TABLET | Freq: Every day | ORAL | Status: DC
  Administered 2022-09-29 – 2022-10-01 (×3): 100 mg via ORAL
  Filled 2022-09-29 (×3): qty 1

## 2022-09-29 MED ORDER — TIROFIBAN HCL IN NACL 5-0.9 MG/100ML-% IV SOLN
0.0750 ug/kg/min | INTRAVENOUS | Status: AC
  Administered 2022-09-29: 0.075 ug/kg/min via INTRAVENOUS
  Filled 2022-09-29: qty 250

## 2022-09-29 MED ORDER — ASPIRIN 81 MG PO CHEW: 81.0000 mg | CHEWABLE_TABLET | Freq: Every day | ORAL | Status: DC

## 2022-09-29 SURGICAL SUPPLY — 21 items
BALLN EMERGE MR 2.0X15 (BALLOONS) ×1
BALLN ~~LOC~~ EMERGE MR 2.75X12 (BALLOONS) ×1
BALLOON EMERGE MR 2.0X15 (BALLOONS) IMPLANT
BALLOON ~~LOC~~ EMERGE MR 2.75X12 (BALLOONS) IMPLANT
CATH EXTRAC PRONTO LP 6F RND (CATHETERS) IMPLANT
CATH INFINITI JR4 5F (CATHETERS) IMPLANT
CATH OPTITORQUE TIG 4.0 5F (CATHETERS) IMPLANT
CATH VISTA GUIDE 6FR XB3.5 (CATHETERS) IMPLANT
DEVICE RAD COMP TR BAND LRG (VASCULAR PRODUCTS) IMPLANT
GLIDESHEATH SLEND SS 6F .021 (SHEATH) IMPLANT
GUIDEWIRE INQWIRE 1.5J.035X260 (WIRE) IMPLANT
INQWIRE 1.5J .035X260CM (WIRE) ×1
KIT ENCORE 26 ADVANTAGE (KITS) IMPLANT
KIT HEART LEFT (KITS) ×1 IMPLANT
PACK CARDIAC CATHETERIZATION (CUSTOM PROCEDURE TRAY) ×1 IMPLANT
SHEATH PROBE COVER 6X72 (BAG) IMPLANT
STENT SYNERGY XD 2.50X20 (Permanent Stent) IMPLANT
SYNERGY XD 2.50X20 (Permanent Stent) ×1 IMPLANT
TRANSDUCER W/STOPCOCK (MISCELLANEOUS) ×1 IMPLANT
TUBING CIL FLEX 10 FLL-RA (TUBING) ×1 IMPLANT
WIRE ASAHI PROWATER 180CM (WIRE) IMPLANT

## 2022-09-29 NOTE — Progress Notes (Signed)
Relayed prelim cath result with Cx disease to Dr. Ophelia Charter so she is aware, as well as note that Dr. Eldridge Dace recommended to defer workup of headache to primary team as it is not clear this was related to his ACS. Cardiology team will continue to follow closely from NSTEMI standpoint.

## 2022-09-29 NOTE — ED Provider Notes (Signed)
[facility] Provider Note   CSN: [id number] Arrival date & time: 09/29/22  0325     History  Chief Complaint  Patient presents with   Headache   Generalized Body Aches    Bryan Thompson is a 73 y.o. male.  The history is provided by the patient and medical records.  Headache Bryan Thompson is a 72 y.o. male who presents to the Emergency Department complaining of bodyaches.  He states that he started feeling very poorly on Friday when he got overheated mowing.  On Saturday he had a pimple pop up on his left upper lip and then on Saturday night he developed body aches of his muscles and joints all over.  On Sunday he noticed 2 more pimples on the right side of his face.  He also developed a headache on the top of his head on Sunday.  On Monday he felt like the pimples were getting worse so he went to urgent care and he did have some very mild sore throat at that time.  He was tested for COVID and was negative and was started on doxycycline due to possible RMSF.  He presents to the emergency department today due to increasing headache described as a global headache that starts at the top of his head as well as pain in bilateral shoulders that spreads throughout his body.  He has been taking Tylenol with only partial improvement in his pain.  He feels tight throughout his head and shoulders.  No chest pain, difficulty breathing, cough, abdominal pain, nausea, vomiting, leg swelling or pain.  He does report decreased appetite.    Home Medications Prior to Admission medications   Medication Sig Start Date End Date Taking? Authorizing Provider  doxycycline (VIBRAMYCIN) 100 MG capsule Take 1 capsule (100 mg total) by mouth 2 (two) times daily for 10 days. 09/27/22 10/07/22  Domenick Gong, MD  irbesartan (AVAPRO) 150 MG tablet Take 0.5 tablets by mouth daily. 10/14/16   [provider]  levothyroxine (SYNTHROID, LEVOTHROID) 150 MCG tablet Take  150 mcg by mouth daily before breakfast.    [provider]  Multiple Vitamins-Minerals (CENTRUM SILVER PO) Take by mouth.    [provider]  sertraline (ZOLOFT) 100 MG tablet Take 100 mg by mouth daily. 05/18/21   [provider]  vitamin B-12 (CYANOCOBALAMIN) 1000 MCG tablet Take 1,000 mcg by mouth daily.    [provider]      Allergies    Fish allergy and Prednisone    Review of Systems   Review of Systems  Neurological:  Positive for headaches.  All other systems reviewed and are negative.   Physical Exam Updated Vital Signs BP 128/73   Pulse 72   Temp 98.4 F (36.9 C) (Oral)   Resp (!) 21   Ht 5\' 10"  (1.778 m)   Wt 97.5 kg   SpO2 96%   BMI 30.85 kg/m  Physical Exam Vitals and nursing note reviewed.  Constitutional:      Appearance: He is well-developed.  HENT:     Head: Normocephalic and atraumatic.  Cardiovascular:     Rate and Rhythm: Normal rate and regular rhythm.     Heart sounds: No murmur heard. Pulmonary:     Effort: Pulmonary effort is normal. No respiratory distress.     Breath sounds: Normal breath sounds.  Abdominal:     Palpations: Abdomen is soft.     Tenderness: There is no abdominal tenderness.  There is no guarding or rebound.  Musculoskeletal:        General: No tenderness.     Cervical back: Neck supple.     Comments: Trace pitting edema to BLE  Skin:    General: Skin is warm and dry.  Neurological:     Mental Status: He is alert and oriented to person, place, and time.  Psychiatric:        Behavior: Behavior normal.     ED Results / Procedures / Treatments   Labs (all labs ordered are listed, but only abnormal results are displayed) Labs Reviewed  COMPREHENSIVE METABOLIC PANEL - Abnormal; Notable for the following components:      Result Value   Sodium 127 (*)    Chloride 96 (*)    Glucose, Bld 160 (*)    Calcium 8.4 (*)    Albumin 3.2 (*)    All other components within normal limits  CBC  WITH DIFFERENTIAL/PLATELET - Abnormal; Notable for the following components:   WBC 13.3 (*)    RBC 3.71 (*)    Hemoglobin 11.7 (*)    HCT 33.7 (*)    Neutro Abs 10.4 (*)    Monocytes Absolute 1.4 (*)    All other components within normal limits  URINALYSIS, ROUTINE W REFLEX MICROSCOPIC - Abnormal; Notable for the following components:   Hgb urine dipstick TRACE (*)    All other components within normal limits  URINALYSIS, MICROSCOPIC (REFLEX) - Abnormal; Notable for the following components:   Bacteria, UA RARE (*)    All other components within normal limits  TROPONIN I (HIGH SENSITIVITY) - Abnormal; Notable for the following components:   Troponin I (High Sensitivity) 7,851 (*)    All other components within normal limits  HEPARIN LEVEL (UNFRACTIONATED)  TROPONIN I (HIGH SENSITIVITY)    EKG EKG Interpretation  Date/Time:  Wednesday Sep 29 2022 05:09:59 EDT Ventricular Rate:  70 PR Interval:  170 QRS Duration: 105 QT Interval:  409 QTC Calculation: 442 R Axis:   -37 Text Interpretation: Sinus rhythm Abnormal R-wave progression, early transition Probable inferior infarct, age indeterminate Lateral leads are also involved Confirmed by Tilden Fossa [phone]) on 09/29/2022 5:16:05 AM  Radiology DG Chest Port 1 View  Result Date: 09/29/2022 CLINICAL DATA:  Headache.  Generalized body aches. EXAM: PORTABLE CHEST 1 VIEW COMPARISON:  12/01/2020 FINDINGS: Borderline heart size accentuated by technique. Mild streaky density behind the heart that is similar to prior. No edema, effusion, or pneumothorax. IMPRESSION: Low volume chest with mild atelectasis or scarring behind the heart. Electronically Signed   By: Tiburcio Pea M.D.   On: 09/29/2022 04:21    Procedures Procedures    Medications Ordered in ED Medications  heparin ADULT infusion 100 units/mL (25000 units/220mL) (1,150 Units/hr Intravenous New Bag/Given 09/29/22 0528)  oxyCODONE (Oxy IR/ROXICODONE) immediate release tablet  5 mg (5 mg Oral Given 09/29/22 0408)  sodium chloride 0.9 % bolus 1,000 mL (0 mLs Intravenous Stopped 09/29/22 0513)  aspirin chewable tablet 324 mg (324 mg Oral Given 09/29/22 0523)  heparin bolus via infusion 400 Units (400 Units Intravenous Bolus from Bag 09/29/22 0528)   CRITICAL CARE Performed by: Tilden Fossa   Total critical care time: 45 minutes  Critical care time was exclusive of separately billable procedures and treating other patients.  Critical care was necessary to treat or prevent imminent or life-threatening deterioration.  Critical care was time spent personally by me on the following activities: development of treatment plan with patient  and/or surrogate as well as nursing, discussions with consultants, evaluation of patient's response to treatment, examination of patient, obtaining history from patient or surrogate, ordering and performing treatments and interventions, ordering and review of laboratory studies, ordering and review of radiographic studies, pulse oximetry and re-evaluation of patient's condition.   ED Course/ Medical Decision Making/ A&P                             Medical Decision Making Amount and/or Complexity of Data Reviewed Labs: ordered. Radiology: ordered.  Risk OTC drugs. Prescription drug management. Decision regarding hospitalization.  Patient with history of MGUS, CKD here for evaluation of malaise, headache, body aches and shoulder pain.  He is nontoxic-appearing on evaluation with no respiratory distress.  EKG is abnormal, no priors available for comparison.  He was treated with IV fluids.  Labs significant for markedly elevated troponin.  CBC with mild leukocytosis and BMP with mild hyponatremia.  On repeat questioning patient continues to report no history of chest pain over the last several days.  He was treated with aspirin for likely acute coronary syndrome and started on heparin.  Current clinical picture is not consistent with PE,  dissection.  Discussed with Dr. Geraldo Pitter with cardiology-recommends medicine admission and cardiology can consult.  Recommends ED to ED transfer for expedited cardiology evaluation if repeat troponin significantly increases. Patient and wife updated findings of studies and recommendation for admission and they are in agreement with treatment plan. Hospitalist consulted for admission.        Final Clinical Impression(s) / ED Diagnoses Final diagnoses:  ACS (acute coronary syndrome) [facility])    Rx / DC Orders ED Discharge Orders     None         Tilden Fossa, MD 09/29/22 [phone]

## 2022-09-29 NOTE — Progress Notes (Signed)
EKG done, patient treated with 1 SL NTG with pain decreasing from 5 to 3-4 out of 10.  BP decreased from 134/73 to 103/57. Dr. Ophelia Charter here to see patient, no further NTG given at this time.

## 2022-09-29 NOTE — H&P (View-Only) (Signed)
Cardiology Consultation   Patient ID: [name] MRN: [id number]; DOB: 04/30/1951  Admit date: 09/29/2022 Date of Consult: 09/29/2022  PCP:  Chilton Greathouse, MD   [facility] Providers Cardiologist:  Lance Muss, MD        Patient Profile:   Bryan Thompson is a 72 y.o. male with a hx of HTN, HLD with prior statin intolerance, MGUS followed at cancer center, depression, probable CKD stage 2, hypothyroidism who is being seen 09/29/2022 for the evaluation of NSTEMI at the request of Dr. Ophelia Charter.  History of Present Illness:   Bryan Thompson has no prior cardiac history. He does report a history of chronic fatigue and some exertional dyspnea but had attributed this to his age, did not report any acute change recently. Mother had mitral valve surgery in her 91s. No tobacco hx. Took 1 statin in the past and felt poorly with this, unclear which one. A friend of theirs died after using one so they did not wish to try another.  On Friday 5/4 he developed a significant persistent headache. Over the weekend he began to develop myalgias starting with bilateral shoulder pain descending down. By Sunday he reported that his entire body hurt, including lower extremities, similar to when he'd had prior Lyme disease exposure. He also noted development of some pimples on his upper lip over the weekend and a small boil on his neck. He went to urgent care on 5/6. Covid test was negative. He was felt to have a small abscess vs ingrown hair on his neck so was started on doxycycline with plan to extend therapy if tick titers were abnormal. No overt tick seen. Lyme/RMSF negative. Last night his shoulder pain worsened, felt sharp/stabbing, and felt as though it was making it difficult to breathe. He was unable to get comfortable. Nothing made the pain worse while it was occurring. The headache was persistent as well. He denies any overt chest pain though was mentioned in H/P. He went to one of our Med  Centers where labs showed hsTroponin 7,851->7,207, Na 127, glucose 160, albumin 3.2, leukocytosis of 13.3, Hgb 13.7, plt wnl. TSH, A1C, lipid panel pending. CXR with mild atx vs scarring behind the heart. Initial EKG showed NSR with nonspecific ST coving in III, avF with TWI in lead III but most recent tracing shows mild diffuse ST upsloping in I, II, avL, V4-V5 as well. He received 324mg  ASA, 2 doses of oxycodone, heparin per pharmacy, IV fluids, and 1 SL NTG. He reports the oxycodone helped the most. The SL NTG was given shortly after arrival here and he reports it did not help much though the pain did eventually subside and he's currently comfortable at rest. He can still feel some awareness of the shoulder pain. He continues to have a headache though slightly less severe than initially on presentation. Otherwise no focal neurologic symptoms.  2D echo shows EF 50-55% with severe hypokinesis of the entire inferolateral wall, G1DD, mild LAE, mild aortic calcification.  Past Medical History:  Diagnosis Date   COVID-19    Depression    Hernia, umbilical    Hyperlipidemia    Hypertension    Thyroid disease     Past Surgical History:  Procedure Laterality Date   CATARACT EXTRACTION     TONSILLECTOMY       Home Medications:  Prior to Admission medications   Medication Sig Start Date End Date Taking? Authorizing Provider  acetaminophen (TYLENOL) 500 MG tablet Take 1,000 mg  by mouth every 8 (eight) hours as needed for moderate pain, fever or headache.   Yes [provider]  doxycycline (VIBRAMYCIN) 100 MG capsule Take 1 capsule (100 mg total) by mouth 2 (two) times daily for 10 days. 09/27/22 10/07/22 Yes Domenick Gong, MD  irbesartan (AVAPRO) 150 MG tablet Take 150 tablets by mouth daily in the afternoon. 10/14/16  Yes [provider]  levothyroxine (SYNTHROID, LEVOTHROID) 150 MCG tablet Take 150 mcg by mouth daily before breakfast.   Yes [provider]  meloxicam  (MOBIC) 15 MG tablet Take 15 mg by mouth daily as needed for pain.   Yes [provider]  Multiple Vitamins-Minerals (CENTRUM SILVER PO) Take 1 tablet by mouth daily in the afternoon.   Yes [provider]  sertraline (ZOLOFT) 100 MG tablet Take 100 mg by mouth daily in the afternoon. 05/18/21  Yes [provider]  vitamin B-12 (CYANOCOBALAMIN) 1000 MCG tablet Take 1,000 mcg by mouth daily in the afternoon.   Yes [provider]    Inpatient Medications: Scheduled Meds:  [START ON 09/30/2022] aspirin EC  81 mg Oral Daily   doxycycline  100 mg Oral BID   [START ON 09/30/2022] levothyroxine  150 mcg Oral Q0600   sertraline  100 mg Oral Daily   sodium chloride flush  3 mL Intravenous Q12H   sodium chloride flush  3 mL Intravenous Q12H   Continuous Infusions:  sodium chloride     sodium chloride     sodium chloride 10 mL/hr at 09/29/22 1320   heparin 1,150 Units/hr (09/29/22 1308)   PRN Meds: sodium chloride, sodium chloride, acetaminophen, morphine injection, nitroGLYCERIN, ondansetron (ZOFRAN) IV, mouth rinse, oxyCODONE, perflutren lipid microspheres (DEFINITY) IV suspension, sodium chloride flush, sodium chloride flush  Allergies:    Allergies  Allergen Reactions   Fish Allergy Swelling    Only fried fish   Prednisone Anxiety    Social History:   Social History   Socioeconomic History   Marital status: Married    Spouse name: Not on file   Number of children: Not on file   Years of education: Not on file   Highest education level: Not on file  Occupational History   Occupation: retired  Tobacco Use   Smoking status: Never   Smokeless tobacco: Never  Vaping Use   Vaping Use: Never used  Substance and Sexual Activity   Alcohol use: Not Currently   Drug use: Never   Sexual activity: Not on file  Other Topics Concern   Not on file  Social History Narrative   Not on file   Social Determinants of Health   Financial Resource Strain:  Not on file  Food Insecurity: No Food Insecurity (09/29/2022)   Hunger Vital Sign    Worried About Running Out of Food in the Last Year: Never true    Ran Out of Food in the Last Year: Never true  Transportation Needs: No Transportation Needs (09/29/2022)   PRAPARE - Administrator, Civil Service (Medical): No    Lack of Transportation (Non-Medical): No  Physical Activity: Not on file  Stress: Not on file  Social Connections: Not on file  Intimate Partner Violence: Not At Risk (09/29/2022)   Humiliation, Afraid, Rape, and Kick questionnaire    Fear of Current or Ex-Partner: No    Emotionally Abused: No    Physically Abused: No    Sexually Abused: No    Family History:   Family History  Problem  Relation Age of Onset   Cancer Mother        breast CA   Heart disease Mother        mitral valve surgery   Colon polyps Father    Hypertension Father    Colon cancer Neg Hx    Esophageal cancer Neg Hx    Rectal cancer Neg Hx    Stomach cancer Neg Hx      ROS:  Please see the history of present illness.  All other ROS reviewed and negative.     Physical Exam/Data:   Vitals:   09/29/22 1035 09/29/22 1058 09/29/22 1227 09/29/22 1234  BP: 134/73 (!) 103/57 109/64 108/64  Pulse: 81 80 77 76  Resp: 20 20 20 17   Temp: (!) 97.3 F (36.3 C)  98.1 F (36.7 C)   TempSrc: Oral  Oral   SpO2: 97% 92% 96% 95%  Weight:      Height:        Intake/Output Summary (Last 24 hours) at 09/29/2022 1325 Last data filed at 09/29/2022 1308 Gross per 24 hour  Intake 926.64 ml  Output 225 ml  Net 701.64 ml      09/29/2022    3:34 AM 09/27/2022    6:51 PM 07/20/2022   10:35 AM  Last 3 Weights  Weight (lbs) 215 lb 215 lb 224 lb 6.4 oz  Weight (kg) 97.523 kg 97.523 kg 101.787 kg     Body mass index is 30.85 kg/m.  General: Well developed, well nourished, in no acute distress Head: Normocephalic, atraumatic, sclera non-icteric, no xanthomas, nares are without discharge. Neck: Negative for  carotid bruits. JVP not elevated. Lungs: Clear bilaterally to auscultation without wheezes, rales, or rhonchi. Breathing is unlabored. Heart: RRR S1 S2 without murmurs, rubs, or gallops.  Abdomen: Soft, non-tender, non-distended with normoactive bowel sounds. No rebound/guarding. He has a follicular appearance to the skin on his abdomen which he reports is chronic Extremities: No clubbing or cyanosis. No edema. Distal pedal pulses are 2+ and equal bilaterally. Neuro: Alert and oriented X 3. Moves all extremities spontaneously. Psych:  Responds to questions appropriately with a normal affect.   EKG:  The EKG was personally reviewed and demonstrates:   Multiple tracings reviewed Initial: NSR 71bpm, nonspecific ST coving in III, avF V6 as well, TWI III 2nd appears similar to initial 3rd appears similar to initial with nonspecific ST coving in III, avF, V6 but can also appreciate some mild diffuse ST upsloping in I, II, avL, V4-V5 as well  Telemetry:  Telemetry was personally reviewed and demonstrates:  NSR  Relevant CV Studies: Pending  Laboratory Data:  High Sensitivity Troponin:   Recent Labs  Lab 09/29/22 0419 09/29/22 0534  TROPONINIHS 7,851* 7,207*     Chemistry Recent Labs  Lab 09/29/22 0419  NA 127*  K 4.0  CL 96*  CO2 22  GLUCOSE 160*  BUN 13  CREATININE 1.21  CALCIUM 8.4*  GFRNONAA >60  ANIONGAP 9    Recent Labs  Lab 09/29/22 0419  PROT 7.2  ALBUMIN 3.2*  AST 39  ALT 28  ALKPHOS 64  BILITOT 0.4   Lipids No results for input(s): "CHOL", "TRIG", "HDL", "LABVLDL", "LDLCALC", "CHOLHDL" in the last 168 hours.  Hematology Recent Labs  Lab 09/29/22 0419  WBC 13.3*  RBC 3.71*  HGB 11.7*  HCT 33.7*  MCV 90.8  MCH 31.5  MCHC 34.7  RDW 12.7  PLT 263   Thyroid No results for input(s): "TSH", "FREET4"  in the last 168 hours.  BNPNo results for input(s): "BNP", "PROBNP" in the last 168 hours.  DDimer No results for input(s): "DDIMER" in the last 168  hours.   Radiology/Studies:  ECHOCARDIOGRAM COMPLETE  Result Date: 09/29/2022    ECHOCARDIOGRAM REPORT   Patient Name:   Bryan Thompson [facility] Date of Exam: 09/29/2022 Medical Rec #:  [id number]       Height:       70.0 in Accession #:    [id number]      Weight:       215.0 lb Date of Birth:  [birth year]      BSA:          2.152 m Patient Age:    71 years        BP:           109/58 mmHg Patient Gender: M               HR:           75 bpm. Exam Location:  Inpatient Procedure: 2D Echo, Cardiac Doppler, Color Doppler and Intracardiac            Opacification Agent STAT ECHO Indications:    Elevated Troponin  History:        Patient has no prior history of Echocardiogram examinations.                 Risk Factors:Hypertension and Dyslipidemia.  Sonographer:    Wallie Char Referring Phys: [id number] JENNIFER YATES  Sonographer Comments: Msgd Jearld Pies MD @ 12:33pm IMPRESSIONS  1. Left ventricular ejection fraction, by estimation, is 50 to 55%. The left ventricle has low normal function. The left ventricle demonstrates regional wall motion abnormalities (see scoring diagram/findings for description). Left ventricular diastolic  parameters are consistent with Grade I diastolic dysfunction (impaired relaxation). There is severe hypokinesis of the left ventricular, entire inferolateral wall.  2. Right ventricular systolic function is normal. The right ventricular size is normal.  3. Left atrial size was mildly dilated.  4. The mitral valve is normal in structure. No evidence of mitral valve regurgitation. No evidence of mitral stenosis.  5. The aortic valve is tricuspid. There is mild calcification of the aortic valve. Aortic valve regurgitation is not visualized. No aortic stenosis is present. Aortic valve area, by VTI measures 1.80 cm. Aortic valve mean gradient measures 6.3 mmHg. Aortic valve Vmax measures 1.69 m/s.  6. The inferior vena cava is normal in size with greater than 50% respiratory variability, suggesting right  atrial pressure of 3 mmHg. FINDINGS  Left Ventricle: Left ventricular ejection fraction, by estimation, is 50 to 55%. The left ventricle has low normal function. The left ventricle demonstrates regional wall motion abnormalities. Severe hypokinesis of the left ventricular, entire inferolateral wall. Definity contrast agent was given IV to delineate the left ventricular endocardial borders. The left ventricular internal cavity size was normal in size. There is no left ventricular hypertrophy. Left ventricular diastolic parameters are consistent with Grade I diastolic dysfunction (impaired relaxation). Right Ventricle: The right ventricular size is normal. No increase in right ventricular wall thickness. Right ventricular systolic function is normal. Left Atrium: Left atrial size was mildly dilated. Right Atrium: Right atrial size was normal in size. Pericardium: There is no evidence of pericardial effusion. Mitral Valve: The mitral valve is normal in structure. No evidence of mitral valve regurgitation. No evidence of mitral valve stenosis. MV peak gradient, 5.8 mmHg. The mean mitral valve gradient is 2.0 mmHg. Tricuspid  Valve: The tricuspid valve is normal in structure. Tricuspid valve regurgitation is trivial. No evidence of tricuspid stenosis. Aortic Valve: The aortic valve is tricuspid. There is mild calcification of the aortic valve. Aortic valve regurgitation is not visualized. No aortic stenosis is present. Aortic valve mean gradient measures 6.3 mmHg. Aortic valve peak gradient measures 11.5 mmHg. Aortic valve area, by VTI measures 1.80 cm. Pulmonic Valve: The pulmonic valve was normal in structure. Pulmonic valve regurgitation is trivial. No evidence of pulmonic stenosis. Aorta: The aortic root is normal in size and structure. Venous: The inferior vena cava is normal in size with greater than 50% respiratory variability, suggesting right atrial pressure of 3 mmHg. IAS/Shunts: No atrial level shunt detected  by color flow Doppler.  LEFT VENTRICLE PLAX 2D LVIDd:         4.30 cm      Diastology LVIDs:         2.90 cm      LV e' medial:    6.03 cm/s LV PW:         1.00 cm      LV E/e' medial:  18.9 LV IVS:        0.70 cm      LV e' lateral:   4.64 cm/s LVOT diam:     1.80 cm      LV E/e' lateral: 24.6 LV SV:         62 LV SV Index:   29 LVOT Area:     2.54 cm  LV Volumes (MOD) LV vol d, MOD A2C: 53.4 ml LV vol d, MOD A4C: 110.0 ml LV vol s, MOD A2C: 23.8 ml LV vol s, MOD A4C: 37.8 ml LV SV MOD A2C:     29.6 ml LV SV MOD A4C:     110.0 ml LV SV MOD BP:      48.8 ml RIGHT VENTRICLE             IVC RV Basal diam:  3.00 cm     IVC diam: 2.00 cm RV S prime:     13.50 cm/s TAPSE (M-mode): 1.9 cm LEFT ATRIUM             Index        RIGHT ATRIUM           Index LA diam:        3.50 cm 1.63 cm/m   RA Area:     11.40 cm LA Vol (A2C):   40.8 ml 18.95 ml/m  RA Volume:   25.00 ml  11.61 ml/m LA Vol (A4C):   42.6 ml 19.79 ml/m LA Biplane Vol: 42.5 ml 19.74 ml/m  AORTIC VALVE AV Area (Vmax):    1.69 cm AV Area (Vmean):   1.78 cm AV Area (VTI):     1.80 cm AV Vmax:           169.33 cm/s AV Vmean:          118.667 cm/s AV VTI:            0.343 m AV Peak Grad:      11.5 mmHg AV Mean Grad:      6.3 mmHg LVOT Vmax:         112.50 cm/s LVOT Vmean:        83.100 cm/s LVOT VTI:          0.242 m LVOT/AV VTI ratio: 0.71  AORTA Ao Root diam: 3.10 cm Ao Asc diam:  3.50  cm MITRAL VALVE                TRICUSPID VALVE MV Area (PHT): 4.04 cm     TR Peak grad:   33.6 mmHg MV Area VTI:   1.85 cm     TR Vmax:        290.00 cm/s MV Peak grad:  5.8 mmHg MV Mean grad:  2.0 mmHg     SHUNTS MV Vmax:       1.20 m/s     Systemic VTI:  0.24 m MV Vmean:      72.2 cm/s    Systemic Diam: 1.80 cm MV Decel Time: 188 msec MV E velocity: 114.00 cm/s MV A velocity: 122.00 cm/s MV E/A ratio:  0.93 Arvilla Meres MD Electronically signed by Arvilla Meres MD Signature Date/Time: 09/29/2022/12:49:50 PM    Final    DG Chest Port 1 View  Result Date:  09/29/2022 CLINICAL DATA:  Headache.  Generalized body aches. EXAM: PORTABLE CHEST 1 VIEW COMPARISON:  12/01/2020 FINDINGS: Borderline heart size accentuated by technique. Mild streaky density behind the heart that is similar to prior. No edema, effusion, or pneumothorax. IMPRESSION: Low volume chest with mild atelectasis or scarring behind the heart. Electronically Signed   By: Tiburcio Pea M.D.   On: 09/29/2022 04:21     Assessment and Plan:   1. NSTEMI - EKG, troponins. echo + WMA concerning for myocardial infarction - bilateral arm pain could be anginal equivalent, story with mixed features - per d/w Dr. Eldridge Dace, have recommended cardiac catheterization, expedited this afternoon given abnormal EKG and degree of enzyme rise - they're calling for him momentarily - received 324mg  ASA, start 81mg  daily in AM - continue heparin pre-cath - hold off BB given soft BP - hold off statin as below - differential also includes myopericarditis given malaise, headache, myalgias as well - no recent travel, surgery, bedrest; not tachycardic, tachypneic or hypoxic  Shared Decision Making/Informed Consent The risks [stroke (1 in 1000), death (1 in 1000), kidney failure [usually temporary] (1 in 500), bleeding (1 in 200), allergic reaction [possibly serious] (1 in 200)], benefits (diagnostic support and management of coronary artery disease) and alternatives of a cardiac catheterization were discussed in detail with Bryan Thompson and he is willing to proceed.   2. Headache, myalgias, leukocytosis, hyponatremia - unclear relationship to #1, tested for RMSF/Lyme which were unrevealing - started on doxycycline on 5/6 for possible small abscess on his neck - check CK with labs - inflammatory markers also pending - discussed with Dr. Eldridge Dace whether neurologic imaging warranted prior to cath; he does not feel this is needed acutely as no focal neurologic symptoms otherwise and needing expedited cath based on  above cardiac findings  3. Essential HTN - controlled  4. Hyperlipidemia - check CK with labs - if normal, consider lipid alternative since patient does not wish to take statins  5. Hyperglycemia - A1C pending   Risk Assessment/Risk Scores:     TIMI Risk Score for Unstable Angina or Non-ST Elevation MI:   The patient's TIMI risk score is 3, which indicates a 13% risk of all cause mortality, new or recurrent myocardial infarction or need for urgent revascularization in the next 14 days.   For questions or updates, please contact [facility] Please consult [email or web address] for contact info under    Signed, Laurann Montana, PA-C  09/29/2022 1:25 PM  I have examined the patient and reviewed assessment and plan and discussed with  patient.  Agree with above as stated.    Patient was somewhat atypical presentation which included myalgias, severe headache.  There is some suspicion for [facility] spotted fever.  He was started on doxycycline.  He also then developed some shoulder pains.  Troponin was checked and found to be elevated.  Echocardiogram shows inferior wall hypokinesis.  He needs urgent cardiac catheterization.  Some borderline ST changes noted in the lateral leads as well.  Further cardiac treatment based on cath results.  The patient has been intolerant of 1 statin that he tried years ago.  The wife also reports that they had a friend who took a statin and "he died."  She is also very concerned about his persistent headache.  She wants to know if some type of imaging is needed to help check for aneurysm.  No focal neurologic symptoms.  Will plan to proceed with catheterization.  Defer workup of headache to primary team.  Lance Muss

## 2022-09-29 NOTE — Progress Notes (Signed)
Patient returned from cath lab at 1635hrs. TR band to right radial site, level zero.  Post cath instructions reviewed with patient.  Wife at bedside.

## 2022-09-29 NOTE — ED Notes (Signed)
[facility] called to page a consult for [facility] hospitalist.

## 2022-09-29 NOTE — H&P (Signed)
History and Physical    Patient: Bryan Thompson:[id number] DOB: [birth year] DOA: 09/29/2022 DOS: the patient was seen and examined on 09/29/2022 PCP: Chilton Greathouse, MD  Patient coming from: Home - lives with ;wife NOK: Wife, [phone]   Chief Complaint: Headache, myalgias  HPI: Bryan Thompson is a 72 y.o. male with medical history significant of HTN, HLD, MGUS, and hypothyroidism presenting with headache and myalgias.  He reports that last Friday (5/4) the top of his head started mowing after mowing on a riding mower for several hours, thought he was overheated.  By Monday, the headache was moving down into the front region with severe headache and B shoulder pain.  He went to UC.  He had a couple of pimples over the weekend on his upper lip.  Didn't see a tick but also had a swollen area on his R chin, improving with doxy.  He was sleeping in a recliner to try to get comfortable.  He was having trouble even putting shoulder against the back rest.  His chest started hurting, was concerned that he was not going to be able to breathe.  The B shoulder pain and headache were the concerning factors but it seemed to be progressively moving down his head into his chest.  He did have chest discomfort overnight and it got better in the ER.  He was given NTG upon arrival at 6E with some improvement in head/back but he did not have chest pain here.  No prior h/o heart problems, no stress or cath in the past.  While in the room, she was asked to sit up and developed complaint of "throat pain".  Upon further questioning, it was not a typical sore throat but was rather possibly radiating pain into the neck and jaw regions.  He was last seen at the cancer [facility] on 2/27 for MGUS with evidence of transformation to multiple myeloma.   He is planned for annual f/u.    ER Course:  [facility] to [facility] transfer, per Dr. Antionette Char:  Muscle aches and fatigue since working in his yard on 08/25/22.    Na++ 127, WBC  13,300, and troponin was 7851 and then 7207.    No STE on EKG and no priors available for comparison. No acute findings on CXR.    Cardiology (Dr. Geraldo Pitter) was consulted by the ED physician, pt was treated with 324 mg ASA, oxycodone, and IVF, and he was started on IV heparin.      Review of Systems: As mentioned in the history of present illness. All other systems reviewed and are negative. Past Medical History:  Diagnosis Date   COVID-19    Depression    Hernia, umbilical    Hyperlipidemia    Hypertension    Thyroid disease    Past Surgical History:  Procedure Laterality Date   CATARACT EXTRACTION     TONSILLECTOMY     Social History:  reports that he has never smoked. He has never used smokeless tobacco. He reports that he does not currently use alcohol. He reports that he does not use drugs.  Allergies  Allergen Reactions   Fish Allergy    Prednisone Anxiety    Family History  Problem Relation Age of Onset   Cancer Mother        breast CA   Heart disease Mother    Colon polyps Father    Hypertension Father    Colon cancer Neg Hx    Esophageal cancer Neg  Hx    Rectal cancer Neg Hx    Stomach cancer Neg Hx     Prior to Admission medications   Medication Sig Start Date End Date Taking? Authorizing Provider  doxycycline (VIBRAMYCIN) 100 MG capsule Take 1 capsule (100 mg total) by mouth 2 (two) times daily for 10 days. 09/27/22 10/07/22  Domenick Gong, MD  irbesartan (AVAPRO) 150 MG tablet Take 0.5 tablets by mouth daily. 10/14/16   [provider]  levothyroxine (SYNTHROID, LEVOTHROID) 150 MCG tablet Take 150 mcg by mouth daily before breakfast.    [provider]  Multiple Vitamins-Minerals (CENTRUM SILVER PO) Take by mouth.    [provider]  sertraline (ZOLOFT) 100 MG tablet Take 100 mg by mouth daily. 05/18/21   [provider]  vitamin B-12 (CYANOCOBALAMIN) 1000 MCG tablet Take 1,000 mcg by mouth daily.    [provider]    Physical Exam: Vitals:   09/29/22 0915 09/29/22 0930 09/29/22 1035 09/29/22 1058  BP:  130/66 134/73 (!) 103/57  Pulse: 71 77 81 80  Resp: (!) 22 (!) 24 20 20   Temp:   (!) 97.3 F (36.3 C)   TempSrc:   Oral   SpO2: 98% 99% 97% 92%  Weight:      Height:       General:  Appears calm and comfortable and is in NAD Eyes:  EOMI, normal lids, iris ENT:  grossly normal hearing, lips & tongue, mmm Neck:  no LAD, masses or thyromegaly Cardiovascular:  RRR, no r/g, 2-3/6 systolic murmur. No LE edema.  Respiratory:   CTA bilaterally with no wheezes/rales/rhonchi, scant L>R basilar crackles.  Normal to mildly increased respiratory effort. Abdomen:  soft, NT, ND Skin:  no rash or induration seen on limited exam Musculoskeletal:  grossly normal tone BUE/BLE, good ROM, no bony abnormality Psychiatric:  grossly normal mood and affect, speech fluent and appropriate, AOx3 Neurologic:  CN 2-12 grossly intact, moves all extremities in coordinated fashion   Radiological Exams on Admission: Independently reviewed - see discussion in A/P where applicable  DG Chest Port 1 View  Result Date: 09/29/2022 CLINICAL DATA:  Headache.  Generalized body aches. EXAM: PORTABLE CHEST 1 VIEW COMPARISON:  12/01/2020 FINDINGS: Borderline heart size accentuated by technique. Mild streaky density behind the heart that is similar to prior. No edema, effusion, or pneumothorax. IMPRESSION: Low volume chest with mild atelectasis or scarring behind the heart. Electronically Signed   By: Tiburcio Pea M.D.   On: 09/29/2022 04:21    EKG: Independently reviewed.   57 - NSR with rate 71; nonspecific ST changes with no evidence of acute ischemia 0509 - NSR with rate 70; nonspecific ST changes (mostly TWI) with no evidence of acute ischemia 1055 - NSR with rate 79; new-appearing subtle ST elevation in V4-6 (cardiology is reviewing now to ascertain if Code STEMI is needed)    Labs on Admission: I have  personally reviewed the available labs and imaging studies at the time of the admission.  Pertinent labs:    Na++ 127 Glucose 160 Albumin 3.2 HS troponin 7851, 7207 WBC 13.3 Hgb 11.7 UA unremarkable COVID negative   Assessment and Plan: Principal Problem:   ACS (acute coronary syndrome) (HCC) Active Problems:   Monoclonal gammopathies   Shoulder pain, bilateral   Essential hypertension   Dyslipidemia   Hypothyroidism   Depression   DNR (do not resuscitate)    ACS -Patient with primarily B shoulder and head pain but also with progressive substernal  chest pain  -The pain in his jaw/chest is exacerbated by sitting up - concerning for an unstable angina but clearly would be an atypical angina variant -CXR unremarkable.   -Initial HS troponin markedly elevated and concerning for ACS; repeat with negative delta.   -EKG with progressive subtle ST elevation in V4-6, cardiology has reviewed and does not think STEMI but is planning cath later today -Will admit since the patient has positive troponins and/or an abnormal EKG with angina necessitating acute intervention. -Risk factor stratification with HgbA1c and FLP; will also check TSH  -Cardiology consultation requested -NPO for invasive evaluation (cardiac catheterization) based on concerning history, pertinent symptoms, hemodynamic instability, ischemic EKG, elevated troponin  -Start heparin drip -NTG for symptom relief (although there is no mortality benefit) -morphine given -STAT echo ordered  Shoulder pain, headache -Patient worked in the yard and developed B shoulder and head pain -This preceded other symptoms and felt like it was progressively moving down to his frontal region and then neck/jaw/chest anteriorly -He was seen at UC on 5/6 and tested from RMSF and Lyme (pending) -He was started on doxy with improvement in a small pustule along his R mandible but otherwise without improvement  in systemic symptoms -Will check  ESR and CRP -If NOT ACS-related, consider further evaluation for tick-borne illness (did not actually see a tick) and/or PMR -Continue doxy for now (unlikely esophagitis from doxy, may help if this is atypical infection)  HTN -Marginal BPs thus far, hold irbesartan and will not currently start beta-blocker -Will also add prn hydralazine  HLD -Reports h/o statin intolerance (although this is not listed in allergies) -Hold statin for now  Hyperglycemia -Glucose 160 -Check A1c -There is no indication to start medication at this time  Hypothyroidism -Check TSH -Continue Synthroid at current dose for now   Depression -Continue sertraline  MGUS -Patient with known MGUS but no malignant transformation to date -Annual f/u for now  DNR -I have discussed code status with the patient and he would not desire resuscitation and would prefer to die a natural death should that situation arise.    Advance Care Planning:   Code Status: DNR   Consults: Cardiology   DVT Prophylaxis: Heparin drip  Family Communication: None present; his wife was present with him at [facility] and will be arriving soon so he asked me not to call her  Severity of Illness: The appropriate patient status for this patient is INPATIENT. Inpatient status is judged to be reasonable and necessary in order to provide the required intensity of service to ensure the patient's safety. The patient's presenting symptoms, physical exam findings, and initial radiographic and laboratory data in the context of their chronic comorbidities is felt to place them at high risk for further clinical deterioration. Furthermore, it is not anticipated that the patient will be medically stable for discharge from the hospital within 2 midnights of admission.   * I certify that at the point of admission it is my clinical judgment that the patient will require inpatient hospital care spanning beyond 2 midnights from the point of admission due to  high intensity of service, high risk for further deterioration and high frequency of surveillance required.*  Author: Jonah Blue, MD 09/29/2022 11:52 AM  For on call review [email or web address].

## 2022-09-29 NOTE — Progress Notes (Signed)
Patient taken to cath lab in bed.

## 2022-09-29 NOTE — Progress Notes (Signed)
Patient transferred from [facility] at 1035hrs.  Arrived on heparin gtt IV at 11.5 cc/h.  C/o head pain that radiates to neck and shoulders, states a 5/10, down from 10/10.  He was given oxycodone prior to leaving [facility] for pain.  Denies SOB.  MD notified.

## 2022-09-29 NOTE — ED Notes (Signed)
ED TO INPATIENT HANDOFF REPORT  ED Nurse Name and Phone #: Lowanda Foster [phone]  S Name/Age/Gender Bryan Thompson 72 y.o. male Room/Bed: MH09/MH09  Code Status   Code Status: Not on file  Home/SNF/Other Home Patient oriented to: self, place, time, and situation Is this baseline? Yes   Triage Complete: Triage complete  Chief Complaint Elevated troponin [R79.89]  Triage Note Wife reports pt was out mowing the yard last week and thinks he overheated. She noticed he had a pus filled pimple on Saturday and he was feeling generalized body aches. Sunday he had two pus filled pimples and began to have headaches. Seen at [facility] for this. Thinks he got bit by a tick. Continues to have upper and lower body pains and feeling fatigued. Reports bad headaches as well. Tested for spotted fever and Lyme disease; currently being tx with doxy and took two 650mg  Tylenols at 0300.   Allergies Allergies  Allergen Reactions   Fish Allergy    Prednisone Anxiety    Level of Care/Admitting Diagnosis ED Disposition     ED Disposition  Admit   Condition  --   Comment  Hospital Area: [facility] [[id number]]  Level of Care: Telemetry Cardiac [103]  May admit patient to [facility] or [facility] if equivalent level of care is available:: No  Interfacility transfer: Yes  Covid Evaluation: Asymptomatic - no recent exposure (last 10 days) testing not required  Diagnosis: Elevated troponin [321909]  Admitting Physician: Briscoe Deutscher [[id number]]  Attending Physician: Briscoe Deutscher [[id number]]  Certification:: I certify this patient will need inpatient services for at least 2 midnights  Estimated Length of Stay: 3          B Medical/Surgery History Past Medical History:  Diagnosis Date   COVID-19    Depression    Hernia, umbilical    Hyperlipidemia    Hypertension    Thyroid disease    Past Surgical History:  Procedure Laterality Date   CATARACT EXTRACTION      TONSILLECTOMY       A IV Location/Drains/Wounds Patient Lines/Drains/Airways Status     Active Line/Drains/Airways     Name Placement date Placement time Site Days   Peripheral IV 09/29/22 20 G Anterior;Right Antecubital 09/29/22  0417  Antecubital  less than 1            Intake/Output Last 24 hours  Intake/Output Summary (Last 24 hours) at 09/29/2022 0620 Last data filed at 09/29/2022 0600 Gross per 24 hour  Intake 800 ml  Output 225 ml  Net 575 ml    Labs/Imaging Results for orders placed or performed during the hospital encounter of 09/29/22 (from the past 48 hour(s))  Comprehensive metabolic panel     Status: Abnormal   Collection Time: 09/29/22  4:19 AM  Result Value Ref Range   Sodium 127 (L) 135 - 145 mmol/L   Potassium 4.0 3.5 - 5.1 mmol/L   Chloride 96 (L) 98 - 111 mmol/L   CO2 22 22 - 32 mmol/L   Glucose, Bld 160 (H) 70 - 99 mg/dL    Comment: Glucose reference range applies only to samples taken after fasting for at least 8 hours.   BUN 13 8 - 23 mg/dL   Creatinine, Ser 5.28 0.61 - 1.24 mg/dL   Calcium 8.4 (L) 8.9 - 10.3 mg/dL   Total Protein 7.2 6.5 - 8.1 g/dL   Albumin 3.2 (L) 3.5 - 5.0 g/dL   AST 39 15 -  41 U/L   ALT 28 0 - 44 U/L   Alkaline Phosphatase 64 38 - 126 U/L   Total Bilirubin 0.4 0.3 - 1.2 mg/dL   GFR, Estimated >16 >10 mL/min    Comment: (NOTE) Calculated using the CKD-EPI Creatinine Equation (2021)    Anion gap 9 5 - 15    Comment: Performed at [facility], [address] [place], [place] [address]  CBC with Differential     Status: Abnormal   Collection Time: 09/29/22  4:19 AM  Result Value Ref Range   WBC 13.3 (H) 4.0 - 10.5 K/uL   RBC 3.71 (L) 4.22 - 5.81 MIL/uL   Hemoglobin 11.7 (L) 13.0 - 17.0 g/dL   HCT 40.9 (L) 81.1 - 91.4 %   MCV 90.8 80.0 - 100.0 fL   MCH 31.5 26.0 - 34.0 pg   MCHC 34.7 30.0 - 36.0 g/dL   RDW 78.2 95.6 - 21.3 %   Platelets 263 150 - 400 K/uL   nRBC 0.0 0.0 - 0.2 %   Neutrophils Relative %  80 %   Neutro Abs 10.4 (H) 1.7 - 7.7 K/uL   Lymphocytes Relative 9 %   Lymphs Abs 1.3 0.7 - 4.0 K/uL   Monocytes Relative 10 %   Monocytes Absolute 1.4 (H) 0.1 - 1.0 K/uL   Eosinophils Relative 1 %   Eosinophils Absolute 0.2 0.0 - 0.5 K/uL   Basophils Relative 0 %   Basophils Absolute 0.0 0.0 - 0.1 K/uL   Immature Granulocytes 0 %   Abs Immature Granulocytes 0.06 0.00 - 0.07 K/uL    Comment: Performed at [facility], [address] [place], [place] [address]  Troponin I (High Sensitivity)     Status: Abnormal   Collection Time: 09/29/22  4:19 AM  Result Value Ref Range   Troponin I (High Sensitivity) 7,851 (HH) <18 ng/L    Comment: CRITICAL RESULT CALLED TO, READ BACK BY AND VERIFIED WITH A. BIENIEK,CHARGE RN [id number] 09/29/2022 T. TYSOR (NOTE) Elevated high sensitivity troponin I (hsTnI) values and significant  changes across serial measurements may suggest ACS but many other  chronic and acute conditions are known to elevate hsTnI results.  Refer to the "Links" section for chest pain algorithms and additional  guidance. Performed at [facility], [address] [place], [place] [address]   Troponin I (High Sensitivity)     Status: Abnormal   Collection Time: 09/29/22  5:34 AM  Result Value Ref Range   Troponin I (High Sensitivity) 7,207 (HH) <18 ng/L    Comment: CRITICAL VALUE NOTED. VALUE IS CONSISTENT WITH PREVIOUSLY REPORTED/CALLED VALUE (NOTE) Elevated high sensitivity troponin I (hsTnI) values and significant  changes across serial measurements may suggest ACS but many other  chronic and acute conditions are known to elevate hsTnI results.  Refer to the "Links" section for chest pain algorithms and additional  guidance. Performed at [facility], [address] [place], [place] [address]   Urinalysis, Routine w reflex microscopic -Urine, Clean Catch     Status: Abnormal   Collection Time: 09/29/22  5:40 AM  Result Value Ref Range    Color, Urine YELLOW YELLOW   APPearance CLEAR CLEAR   Specific Gravity, Urine 1.010 1.005 - 1.030   pH 6.0 5.0 - 8.0   Glucose, UA NEGATIVE NEGATIVE mg/dL   Hgb urine dipstick TRACE (A) NEGATIVE   Bilirubin Urine NEGATIVE NEGATIVE   Ketones, ur  NEGATIVE NEGATIVE mg/dL   Protein, ur NEGATIVE NEGATIVE mg/dL   Nitrite NEGATIVE NEGATIVE   Leukocytes,Ua NEGATIVE NEGATIVE    Comment: Performed at [facility], [address] [place], [place] [address]  Urinalysis, Microscopic (reflex)     Status: Abnormal   Collection Time: 09/29/22  5:40 AM  Result Value Ref Range   RBC / HPF 0-5 0 - 5 RBC/hpf   WBC, UA 0-5 0 - 5 WBC/hpf   Bacteria, UA RARE (A) NONE SEEN   Squamous Epithelial / HPF 0-5 0 - 5 /HPF    Comment: Performed at [facility], [address] [place], [place] [address]   DG Chest Port 1 View  Result Date: 09/29/2022 CLINICAL DATA:  Headache.  Generalized body aches. EXAM: PORTABLE CHEST 1 VIEW COMPARISON:  12/01/2020 FINDINGS: Borderline heart size accentuated by technique. Mild streaky density behind the heart that is similar to prior. No edema, effusion, or pneumothorax. IMPRESSION: Low volume chest with mild atelectasis or scarring behind the heart. Electronically Signed   By: Tiburcio Pea M.D.   On: 09/29/2022 04:21    Pending Labs Unresulted Labs (From admission, onward)     Start     Ordered   09/29/22 1200  Heparin level (unfractionated)  Once-Timed,   URGENT        09/29/22 0520            Vitals/Pain Today's Vitals   09/29/22 0430 09/29/22 0500 09/29/22 0530 09/29/22 0600  BP: 120/70 113/67 128/73 129/73  Pulse: 71 70 72 72  Resp: (!) 22 20 (!) 21 20  Temp:      TempSrc:      SpO2: 95% 93% 96% 95%  Weight:      Height:      PainSc:        Isolation Precautions No active isolations  Medications Medications  heparin ADULT infusion 100 units/mL (25000 units/260mL) (1,150 Units/hr Intravenous New Bag/Given 09/29/22 0528)   oxyCODONE (Oxy IR/ROXICODONE) immediate release tablet 5 mg (5 mg Oral Given 09/29/22 0408)  sodium chloride 0.9 % bolus 1,000 mL (0 mLs Intravenous Stopped 09/29/22 0513)  aspirin chewable tablet 324 mg (324 mg Oral Given 09/29/22 0523)  heparin bolus via infusion 400 Units (400 Units Intravenous Bolus from Bag 09/29/22 0528)    Mobility walks     Focused Assessments Cardiac Assessment Handoff:   Does the Patient currently have chest pain? No   , Neuro Assessment Handoff:    Neuro Assessment: Exceptions to WDL Neuro Checks: A&Ox4  R Recommendations: See Admitting Provider Note  Report given to:   Additional Notes:  A&Ox4, was complaining of a headache that improved with oxy IR and fluids. Denies chest pain/sob. C.o generalized body aches and headache since Friday. Seen at [facility] and started on Doxycycline. 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 Pending rocky mounted spotted fever results. Slight elevation in first EKG, troponin incidentally ~7800--given ASA and started on heparin. Per wife and pt, he has been feeling not well for 3-4 years with generalized fatigue and exhaustion. Has been seen by his PCP with no significant results. Has been using a urinal for output.

## 2022-09-29 NOTE — Progress Notes (Signed)
Plan of Care Note for accepted transfer   Patient: Bryan Thompson MRN: [id number]   DOA: 09/29/2022  Facility requesting transfer: [facility]   Requesting Provider: Dr. Madilyn Hook   Reason for transfer: Elevated troponin   Facility course: 72 yr old man with hx of HTN, hypothyroidism, MGUS, and depression who presents with aches and fatigue since working in his yard on 08/25/22.   Serum sodium is 127, WBC 13,300, and troponin was 7851 and then 7207.   No STE on EKG and no priors available for comparison. No acute findings on CXR.   Cardiology (Dr. Geraldo Pitter) was consulted by the ED physician, pt was treated with 324 mg ASA, oxycodone, and IVF, and he was started on IV heparin.   Plan of care: The patient is accepted for admission to Telemetry unit, at [facility].   Author: Briscoe Deutscher, MD 09/29/2022  Check [email or web address] for on-call coverage.  Nursing staff, Please call [facility] Admits & Consults System-Wide number on Amion as soon as patient's arrival, so appropriate admitting provider can evaluate the pt.

## 2022-09-29 NOTE — Progress Notes (Addendum)
Called to see patient for recurrent pain in his upper back between his shoulder blades. This is the same pain he presented with to the hospital. VSS. Per nurse he returned from cath lab pain free but then developed 8/10 recurrent pain, received 1 SL NTG with pain down to 4/10, BP 118/64 then 123/70. Upon evaluation patient reports he's feeling "pretty good" presently but wanted to mention pain since it was presenting symptom. He most notably notices that it is worse lying back, feels much better when he's sitting up. He denies any chest pain or difficulty breathing. Exam is benign, RRR, is noted to have very soft SEM at RUSB easiest to hear when patient holds his breath, possibly aortic sclerosis. No overt rub or gallop. Lungs are clear. Patient is in no acute distress. EKG shows NSR with PAC, still subtly abnormal but similar to previous tracing. Headache has improved. F/u troponin already in process but last value was downtrending. Given positional component, question component of pericarditis - per d/w Dr. Eldridge Dace, will give ibuprofen 600mg  x 1, morphine 2mg  x 1, and start colchicine pericarditis dosing. Will also add PPI given ibuprofen plan. Patient/nurse aware to notify for escalating symptoms.   Addendum: F/u troponin with slight uptrend from previous at 6,560 but still improved from initial peak. This is not unexpected post PCI. Per discussion with PM nurse, patient is feeling much better with measures above, pain free. D/w MD on call. Continue plan as previously outlined. Will trend f/u troponin in AM.

## 2022-09-29 NOTE — ED Triage Notes (Addendum)
Wife reports pt was out mowing the yard last week and thinks he overheated. She noticed he had a pus filled pimple on Saturday and he was feeling generalized body aches. Sunday he had two pus filled pimples and began to have headaches. Seen at [facility] for this. Thinks he got bit by a tick. Continues to have upper and lower body pains and feeling fatigued. Reports bad headaches as well. Tested for spotted fever and Lyme disease; currently being tx with doxy and took two 650mg  Tylenols at 0300.

## 2022-09-29 NOTE — Consult Note (Addendum)
 Cardiology Consultation   Patient ID: Bryan Thompson MRN: [id number]; DOB: 09/09/1950  Admit date: 09/29/2022 Date of Consult: 09/29/2022  PCP:  Avva, Ravisankar, MD   [facility] Providers Cardiologist:  Kailei Cowens, MD        Patient Profile:   Bryan Thompson is a 71 y.o. male with a hx of HTN, HLD with prior statin intolerance, MGUS followed at cancer center, depression, probable CKD stage 2, hypothyroidism who is being seen 09/29/2022 for the evaluation of NSTEMI at the request of Dr. Yates.  History of Present Illness:   Bryan Thompson has no prior cardiac history. He does report a history of chronic fatigue and some exertional dyspnea but had attributed this to his age, did not report any acute change recently. Mother had mitral valve surgery in her 70s. No tobacco hx. Took 1 statin in the past and felt poorly with this, unclear which one. A friend of theirs died after using one so they did not wish to try another.  On Friday 5/4 he developed a significant persistent headache. Over the weekend he began to develop myalgias starting with bilateral shoulder pain descending down. By Sunday he reported that his entire body hurt, including lower extremities, similar to when he'd had prior Lyme disease exposure. He also noted development of some pimples on his upper lip over the weekend and a small boil on his neck. He went to urgent care on 5/6. Covid test was negative. He was felt to have a small abscess vs ingrown hair on his neck so was started on doxycycline with plan to extend therapy if tick titers were abnormal. No overt tick seen. Lyme/RMSF negative. Last night his shoulder pain worsened, felt sharp/stabbing, and felt as though it was making it difficult to breathe. He was unable to get comfortable. Nothing made the pain worse while it was occurring. The headache was persistent as well. He denies any overt chest pain though was mentioned in H/P. He went to one of our Med  Centers where labs showed hsTroponin 7,851->7,207, Na 127, glucose 160, albumin 3.2, leukocytosis of 13.3, Hgb 13.7, plt wnl. TSH, A1C, lipid panel pending. CXR with mild atx vs scarring behind the heart. Initial EKG showed NSR with nonspecific ST coving in III, avF with TWI in lead III but most recent tracing shows mild diffuse ST upsloping in I, II, avL, V4-V5 as well. He received 324mg ASA, 2 doses of oxycodone, heparin per pharmacy, IV fluids, and 1 SL NTG. He reports the oxycodone helped the most. The SL NTG was given shortly after arrival here and he reports it did not help much though the pain did eventually subside and he's currently comfortable at rest. He can still feel some awareness of the shoulder pain. He continues to have a headache though slightly less severe than initially on presentation. Otherwise no focal neurologic symptoms.  2D echo shows EF 50-55% with severe hypokinesis of the entire inferolateral wall, G1DD, mild LAE, mild aortic calcification.  Past Medical History:  Diagnosis Date   COVID-19    Depression    Hernia, umbilical    Hyperlipidemia    Hypertension    Thyroid disease     Past Surgical History:  Procedure Laterality Date   CATARACT EXTRACTION     TONSILLECTOMY       Home Medications:  Prior to Admission medications   Medication Sig Start Date End Date Taking? Authorizing Provider  acetaminophen (TYLENOL) 500 MG tablet Take 1,000 mg   by mouth every 8 (eight) hours as needed for moderate pain, fever or headache.   Yes [provider]  doxycycline (VIBRAMYCIN) 100 MG capsule Take 1 capsule (100 mg total) by mouth 2 (two) times daily for 10 days. 09/27/22 10/07/22 Yes Mortenson, Ashley, MD  irbesartan (AVAPRO) 150 MG tablet Take 150 tablets by mouth daily in the afternoon. 10/14/16  Yes [provider]  levothyroxine (SYNTHROID, LEVOTHROID) 150 MCG tablet Take 150 mcg by mouth daily before breakfast.   Yes [provider]  meloxicam  (MOBIC) 15 MG tablet Take 15 mg by mouth daily as needed for pain.   Yes [provider]  Multiple Vitamins-Minerals (CENTRUM SILVER PO) Take 1 tablet by mouth daily in the afternoon.   Yes [provider]  sertraline (ZOLOFT) 100 MG tablet Take 100 mg by mouth daily in the afternoon. 05/18/21  Yes [provider]  vitamin B-12 (CYANOCOBALAMIN) 1000 MCG tablet Take 1,000 mcg by mouth daily in the afternoon.   Yes [provider]    Inpatient Medications: Scheduled Meds:  [START ON 09/30/2022] aspirin EC  81 mg Oral Daily   doxycycline  100 mg Oral BID   [START ON 09/30/2022] levothyroxine  150 mcg Oral Q0600   sertraline  100 mg Oral Daily   sodium chloride flush  3 mL Intravenous Q12H   sodium chloride flush  3 mL Intravenous Q12H   Continuous Infusions:  sodium chloride     sodium chloride     sodium chloride 10 mL/hr at 09/29/22 1320   heparin 1,150 Units/hr (09/29/22 1308)   PRN Meds: sodium chloride, sodium chloride, acetaminophen, morphine injection, nitroGLYCERIN, ondansetron (ZOFRAN) IV, mouth rinse, oxyCODONE, perflutren lipid microspheres (DEFINITY) IV suspension, sodium chloride flush, sodium chloride flush  Allergies:    Allergies  Allergen Reactions   Fish Allergy Swelling    Only fried fish   Prednisone Anxiety    Social History:   Social History   Socioeconomic History   Marital status: Married    Spouse name: Not on file   Number of children: Not on file   Years of education: Not on file   Highest education level: Not on file  Occupational History   Occupation: retired  Tobacco Use   Smoking status: Never   Smokeless tobacco: Never  Vaping Use   Vaping Use: Never used  Substance and Sexual Activity   Alcohol use: Not Currently   Drug use: Never   Sexual activity: Not on file  Other Topics Concern   Not on file  Social History Narrative   Not on file   Social Determinants of Health   Financial Resource Strain:  Not on file  Food Insecurity: No Food Insecurity (09/29/2022)   Hunger Vital Sign    Worried About Running Out of Food in the Last Year: Never true    Ran Out of Food in the Last Year: Never true  Transportation Needs: No Transportation Needs (09/29/2022)   PRAPARE - Transportation    Lack of Transportation (Medical): No    Lack of Transportation (Non-Medical): No  Physical Activity: Not on file  Stress: Not on file  Social Connections: Not on file  Intimate Partner Violence: Not At Risk (09/29/2022)   Humiliation, Afraid, Rape, and Kick questionnaire    Fear of Current or Ex-Partner: No    Emotionally Abused: No    Physically Abused: No    Sexually Abused: No    Family History:   Family History  Problem   Relation Age of Onset   Cancer Mother        breast CA   Heart disease Mother        mitral valve surgery   Colon polyps Father    Hypertension Father    Colon cancer Neg Hx    Esophageal cancer Neg Hx    Rectal cancer Neg Hx    Stomach cancer Neg Hx      ROS:  Please see the history of present illness.  All other ROS reviewed and negative.     Physical Exam/Data:   Vitals:   09/29/22 1035 09/29/22 1058 09/29/22 1227 09/29/22 1234  BP: 134/73 (!) 103/57 109/64 108/64  Pulse: 81 80 77 76  Resp: 20 20 20 17  Temp: (!) 97.3 F (36.3 C)  98.1 F (36.7 C)   TempSrc: Oral  Oral   SpO2: 97% 92% 96% 95%  Weight:      Height:        Intake/Output Summary (Last 24 hours) at 09/29/2022 1325 Last data filed at 09/29/2022 1308 Gross per 24 hour  Intake 926.64 ml  Output 225 ml  Net 701.64 ml      09/29/2022    3:34 AM 09/27/2022    6:51 PM 07/20/2022   10:35 AM  Last 3 Weights  Weight (lbs) 215 lb 215 lb 224 lb 6.4 oz  Weight (kg) 97.523 kg 97.523 kg 101.787 kg     Body mass index is 30.85 kg/m.  General: Well developed, well nourished, in no acute distress Head: Normocephalic, atraumatic, sclera non-icteric, no xanthomas, nares are without discharge. Neck: Negative for  carotid bruits. JVP not elevated. Lungs: Clear bilaterally to auscultation without wheezes, rales, or rhonchi. Breathing is unlabored. Heart: RRR S1 S2 without murmurs, rubs, or gallops.  Abdomen: Soft, non-tender, non-distended with normoactive bowel sounds. No rebound/guarding. He has a follicular appearance to the skin on his abdomen which he reports is chronic Extremities: No clubbing or cyanosis. No edema. Distal pedal pulses are 2+ and equal bilaterally. Neuro: Alert and oriented X 3. Moves all extremities spontaneously. Psych:  Responds to questions appropriately with a normal affect.   EKG:  The EKG was personally reviewed and demonstrates:   Multiple tracings reviewed Initial: NSR 71bpm, nonspecific ST coving in III, avF V6 as well, TWI III 2nd appears similar to initial 3rd appears similar to initial with nonspecific ST coving in III, avF, V6 but can also appreciate some mild diffuse ST upsloping in I, II, avL, V4-V5 as well  Telemetry:  Telemetry was personally reviewed and demonstrates:  NSR  Relevant CV Studies: Pending  Laboratory Data:  High Sensitivity Troponin:   Recent Labs  Lab 09/29/22 0419 09/29/22 0534  TROPONINIHS 7,851* 7,207*     Chemistry Recent Labs  Lab 09/29/22 0419  NA 127*  K 4.0  CL 96*  CO2 22  GLUCOSE 160*  BUN 13  CREATININE 1.21  CALCIUM 8.4*  GFRNONAA >60  ANIONGAP 9    Recent Labs  Lab 09/29/22 0419  PROT 7.2  ALBUMIN 3.2*  AST 39  ALT 28  ALKPHOS 64  BILITOT 0.4   Lipids No results for input(s): "CHOL", "TRIG", "HDL", "LABVLDL", "LDLCALC", "CHOLHDL" in the last 168 hours.  Hematology Recent Labs  Lab 09/29/22 0419  WBC 13.3*  RBC 3.71*  HGB 11.7*  HCT 33.7*  MCV 90.8  MCH 31.5  MCHC 34.7  RDW 12.7  PLT 263   Thyroid No results for input(s): "TSH", "FREET4"   in the last 168 hours.  BNPNo results for input(s): "BNP", "PROBNP" in the last 168 hours.  DDimer No results for input(s): "DDIMER" in the last 168  hours.   Radiology/Studies:  ECHOCARDIOGRAM COMPLETE  Result Date: 09/29/2022    ECHOCARDIOGRAM REPORT   Patient Name:   Bryan Thompson Date of Exam: 09/29/2022 Medical Rec #:  [id number]       Height:       70.0 in Accession #:    [id number]      Weight:       215.0 lb Date of Birth:  01/09/1951      BSA:          2.152 m Patient Age:    71 years        BP:           109/58 mmHg Patient Gender: M               HR:           75 bpm. Exam Location:  Inpatient Procedure: 2D Echo, Cardiac Doppler, Color Doppler and Intracardiac            Opacification Agent STAT ECHO Indications:    Elevated Troponin  History:        Patient has no prior history of Echocardiogram examinations.                 Risk Factors:Hypertension and Dyslipidemia.  Sonographer:    Molly Halloran Referring Phys: [id number] JENNIFER YATES  Sonographer Comments: Msgd McClean MD @ 12:33pm IMPRESSIONS  1. Left ventricular ejection fraction, by estimation, is 50 to 55%. The left ventricle has low normal function. The left ventricle demonstrates regional wall motion abnormalities (see scoring diagram/findings for description). Left ventricular diastolic  parameters are consistent with Grade I diastolic dysfunction (impaired relaxation). There is severe hypokinesis of the left ventricular, entire inferolateral wall.  2. Right ventricular systolic function is normal. The right ventricular size is normal.  3. Left atrial size was mildly dilated.  4. The mitral valve is normal in structure. No evidence of mitral valve regurgitation. No evidence of mitral stenosis.  5. The aortic valve is tricuspid. There is mild calcification of the aortic valve. Aortic valve regurgitation is not visualized. No aortic stenosis is present. Aortic valve area, by VTI measures 1.80 cm. Aortic valve mean gradient measures 6.3 mmHg. Aortic valve Vmax measures 1.69 m/s.  6. The inferior vena cava is normal in size with greater than 50% respiratory variability, suggesting right  atrial pressure of 3 mmHg. FINDINGS  Left Ventricle: Left ventricular ejection fraction, by estimation, is 50 to 55%. The left ventricle has low normal function. The left ventricle demonstrates regional wall motion abnormalities. Severe hypokinesis of the left ventricular, entire inferolateral wall. Definity contrast agent was given IV to delineate the left ventricular endocardial borders. The left ventricular internal cavity size was normal in size. There is no left ventricular hypertrophy. Left ventricular diastolic parameters are consistent with Grade I diastolic dysfunction (impaired relaxation). Right Ventricle: The right ventricular size is normal. No increase in right ventricular wall thickness. Right ventricular systolic function is normal. Left Atrium: Left atrial size was mildly dilated. Right Atrium: Right atrial size was normal in size. Pericardium: There is no evidence of pericardial effusion. Mitral Valve: The mitral valve is normal in structure. No evidence of mitral valve regurgitation. No evidence of mitral valve stenosis. MV peak gradient, 5.8 mmHg. The mean mitral valve gradient is 2.0 mmHg. Tricuspid   Valve: The tricuspid valve is normal in structure. Tricuspid valve regurgitation is trivial. No evidence of tricuspid stenosis. Aortic Valve: The aortic valve is tricuspid. There is mild calcification of the aortic valve. Aortic valve regurgitation is not visualized. No aortic stenosis is present. Aortic valve mean gradient measures 6.3 mmHg. Aortic valve peak gradient measures 11.5 mmHg. Aortic valve area, by VTI measures 1.80 cm. Pulmonic Valve: The pulmonic valve was normal in structure. Pulmonic valve regurgitation is trivial. No evidence of pulmonic stenosis. Aorta: The aortic root is normal in size and structure. Venous: The inferior vena cava is normal in size with greater than 50% respiratory variability, suggesting right atrial pressure of 3 mmHg. IAS/Shunts: No atrial level shunt detected  by color flow Doppler.  LEFT VENTRICLE PLAX 2D LVIDd:         4.30 cm      Diastology LVIDs:         2.90 cm      LV e' medial:    6.03 cm/s LV PW:         1.00 cm      LV E/e' medial:  18.9 LV IVS:        0.70 cm      LV e' lateral:   4.64 cm/s LVOT diam:     1.80 cm      LV E/e' lateral: 24.6 LV SV:         62 LV SV Index:   29 LVOT Area:     2.54 cm  LV Volumes (MOD) LV vol d, MOD A2C: 53.4 ml LV vol d, MOD A4C: 110.0 ml LV vol s, MOD A2C: 23.8 ml LV vol s, MOD A4C: 37.8 ml LV SV MOD A2C:     29.6 ml LV SV MOD A4C:     110.0 ml LV SV MOD BP:      48.8 ml RIGHT VENTRICLE             IVC RV Basal diam:  3.00 cm     IVC diam: 2.00 cm RV S prime:     13.50 cm/s TAPSE (M-mode): 1.9 cm LEFT ATRIUM             Index        RIGHT ATRIUM           Index LA diam:        3.50 cm 1.63 cm/m   RA Area:     11.40 cm LA Vol (A2C):   40.8 ml 18.95 ml/m  RA Volume:   25.00 ml  11.61 ml/m LA Vol (A4C):   42.6 ml 19.79 ml/m LA Biplane Vol: 42.5 ml 19.74 ml/m  AORTIC VALVE AV Area (Vmax):    1.69 cm AV Area (Vmean):   1.78 cm AV Area (VTI):     1.80 cm AV Vmax:           169.33 cm/s AV Vmean:          118.667 cm/s AV VTI:            0.343 m AV Peak Grad:      11.5 mmHg AV Mean Grad:      6.3 mmHg LVOT Vmax:         112.50 cm/s LVOT Vmean:        83.100 cm/s LVOT VTI:          0.242 m LVOT/AV VTI ratio: 0.71  AORTA Ao Root diam: 3.10 cm Ao Asc diam:  3.50   cm MITRAL VALVE                TRICUSPID VALVE MV Area (PHT): 4.04 cm     TR Peak grad:   33.6 mmHg MV Area VTI:   1.85 cm     TR Vmax:        290.00 cm/s MV Peak grad:  5.8 mmHg MV Mean grad:  2.0 mmHg     SHUNTS MV Vmax:       1.20 m/s     Systemic VTI:  0.24 m MV Vmean:      72.2 cm/s    Systemic Diam: 1.80 cm MV Decel Time: 188 msec MV E velocity: 114.00 cm/s MV A velocity: 122.00 cm/s MV E/A ratio:  0.93 Daniel Bensimhon MD Electronically signed by Daniel Bensimhon MD Signature Date/Time: 09/29/2022/12:49:50 PM    Final    DG Chest Port 1 View  Result Date:  09/29/2022 CLINICAL DATA:  Headache.  Generalized body aches. EXAM: PORTABLE CHEST 1 VIEW COMPARISON:  12/01/2020 FINDINGS: Borderline heart size accentuated by technique. Mild streaky density behind the heart that is similar to prior. No edema, effusion, or pneumothorax. IMPRESSION: Low volume chest with mild atelectasis or scarring behind the heart. Electronically Signed   By: Jonathan  Watts M.D.   On: 09/29/2022 04:21     Assessment and Plan:   1. NSTEMI - EKG, troponins. echo + WMA concerning for myocardial infarction - bilateral arm pain could be anginal equivalent, story with mixed features - per d/w Dr. Dynver Clemson, have recommended cardiac catheterization, expedited this afternoon given abnormal EKG and degree of enzyme rise - they're calling for him momentarily - received 324mg ASA, start 81mg daily in AM - continue heparin pre-cath - hold off BB given soft BP - hold off statin as below - differential also includes myopericarditis given malaise, headache, myalgias as well - no recent travel, surgery, bedrest; not tachycardic, tachypneic or hypoxic  Shared Decision Making/Informed Consent The risks [stroke (1 in 1000), death (1 in 1000), kidney failure [usually temporary] (1 in 500), bleeding (1 in 200), allergic reaction [possibly serious] (1 in 200)], benefits (diagnostic support and management of coronary artery disease) and alternatives of a cardiac catheterization were discussed in detail with Bryan Thompson and he is willing to proceed.   2. Headache, myalgias, leukocytosis, hyponatremia - unclear relationship to #1, tested for RMSF/Lyme which were unrevealing - started on doxycycline on 5/6 for possible small abscess on his neck - check CK with labs - inflammatory markers also pending - discussed with Dr. Doralyn Kirkes whether neurologic imaging warranted prior to cath; he does not feel this is needed acutely as no focal neurologic symptoms otherwise and needing expedited cath based on  above cardiac findings  3. Essential HTN - controlled  4. Hyperlipidemia - check CK with labs - if normal, consider lipid alternative since patient does not wish to take statins  5. Hyperglycemia - A1C pending   Risk Assessment/Risk Scores:     TIMI Risk Score for Unstable Angina or Non-ST Elevation MI:   The patient's TIMI risk score is 3, which indicates a 13% risk of all cause mortality, new or recurrent myocardial infarction or need for urgent revascularization in the next 14 days.   For questions or updates, please contact [facility] Please consult [email or web address] for contact info under    Signed, Dayna N Dunn, PA-C  09/29/2022 1:25 PM  I have examined the patient and reviewed assessment and plan and discussed with   patient.  Agree with above as stated.    Patient was somewhat atypical presentation which included myalgias, severe headache.  There is some suspicion for [place] spotted fever.  He was started on doxycycline.  He also then developed some shoulder pains.  Troponin was checked and found to be elevated.  Echocardiogram shows inferior wall hypokinesis.  He needs urgent cardiac catheterization.  Some borderline ST changes noted in the lateral leads as well.  Further cardiac treatment based on cath results.  The patient has been intolerant of 1 statin that he tried years ago.  The wife also reports that they had a friend who took a statin and "he died."  She is also very concerned about his persistent headache.  She wants to know if some type of imaging is needed to help check for aneurysm.  No focal neurologic symptoms.  Will plan to proceed with catheterization.  Defer workup of headache to primary team.  Cliffton Spradley   

## 2022-09-29 NOTE — ED Notes (Signed)
[facility] called for transport. 

## 2022-09-29 NOTE — Interval H&P Note (Signed)
Cath Lab Visit (complete for each Cath Lab visit)  Clinical Evaluation Leading to the Procedure:   ACS: Yes.    Non-ACS:    Anginal Classification: CCS IV  Anti-ischemic medical therapy: No Therapy  Non-Invasive Test Results: No non-invasive testing performed  Prior CABG: No previous CABG      History and Physical Interval Note:  09/29/2022 1:48 PM  [name]  has presented today for surgery, with the diagnosis of nstemi.  The various methods of treatment have been discussed with the patient and family. After consideration of risks, benefits and other options for treatment, the patient has consented to  Procedure(s): LEFT HEART CATH AND CORONARY ANGIOGRAPHY (N/A) as a surgical intervention.  The patient's history has been reviewed, patient examined, no change in status, stable for surgery.  I have reviewed the patient's chart and labs.  Questions were answered to the patient's satisfaction.     Nicki Guadalajara

## 2022-09-29 NOTE — Progress Notes (Signed)
ANTICOAGULATION CONSULT NOTE - Initial Consult  Pharmacy Consult for Heparin Indication: chest pain/ACS  Allergies  Allergen Reactions   Fish Allergy    Prednisone Anxiety    Patient Measurements: Height: 5\' 10"  (177.8 cm) Weight: 97.5 kg (215 lb) IBW/kg (Calculated) : 73  Vital Signs: Temp: 98.4 F (36.9 C) (05/08 0334) Temp Source: Oral (05/08 0334) BP: 126/74 (05/08 0334) Pulse Rate: 75 (05/08 0334)  Labs: Recent Labs    09/29/22 0419  HGB 11.7*  HCT 33.7*  PLT 263  CREATININE 1.21  TROPONINIHS 7,851*    Estimated Creatinine Clearance: 63.7 mL/min (by C-G formula based on SCr of 1.21 mg/dL).   Medical History: Past Medical History:  Diagnosis Date   COVID-19    Depression    Hernia, umbilical    Hyperlipidemia    Hypertension    Thyroid disease     Medications:  No current facility-administered medications on file prior to encounter.   Current Outpatient Medications on File Prior to Encounter  Medication Sig Dispense Refill   doxycycline (VIBRAMYCIN) 100 MG capsule Take 1 capsule (100 mg total) by mouth 2 (two) times daily for 10 days. 20 capsule 0   irbesartan (AVAPRO) 150 MG tablet Take 0.5 tablets by mouth daily.     levothyroxine (SYNTHROID, LEVOTHROID) 150 MCG tablet Take 150 mcg by mouth daily before breakfast.     Multiple Vitamins-Minerals (CENTRUM SILVER PO) Take by mouth.     sertraline (ZOLOFT) 100 MG tablet Take 100 mg by mouth daily.     vitamin B-12 (CYANOCOBALAMIN) 1000 MCG tablet Take 1,000 mcg by mouth daily.       Assessment: 72 y.o. male with elevated troponin/ACS for heparin Goal of Therapy:  Heparin level 0.3-0.7 units/ml Monitor platelets by anticoagulation protocol: Yes   Plan:  Heparin 4000 units IV bolus, then start heparin 1150 units/hr Check heparin level in 6 hours.   Eddie Candle 09/29/2022,5:18 AM

## 2022-09-29 NOTE — Progress Notes (Signed)
Patient notified nurse of back pain, between shoulders. Denies SOB.  EKG obtained, NTG SL x1 given with pain decreasing from 8 to 5 out of 10. PA Dunn notified and here to see patient. Orders placed.

## 2022-09-29 NOTE — ED Notes (Signed)
Pt reports feeling better after the fluids. Denies chest pain or sob. Fluids stopped after due to elevated troponin

## 2022-09-30 ENCOUNTER — Telehealth (HOSPITAL_COMMUNITY): Payer: Self-pay

## 2022-09-30 ENCOUNTER — Encounter (HOSPITAL_COMMUNITY): Payer: Self-pay | Admitting: Cardiovascular Disease

## 2022-09-30 ENCOUNTER — Other Ambulatory Visit (HOSPITAL_COMMUNITY): Payer: Self-pay

## 2022-09-30 DIAGNOSIS — I10 Essential (primary) hypertension: Secondary | ICD-10-CM | POA: Diagnosis not present

## 2022-09-30 DIAGNOSIS — I471 Supraventricular tachycardia, unspecified: Secondary | ICD-10-CM

## 2022-09-30 DIAGNOSIS — R519 Headache, unspecified: Secondary | ICD-10-CM | POA: Diagnosis not present

## 2022-09-30 DIAGNOSIS — E785 Hyperlipidemia, unspecified: Secondary | ICD-10-CM | POA: Diagnosis not present

## 2022-09-30 DIAGNOSIS — I249 Acute ischemic heart disease, unspecified: Secondary | ICD-10-CM | POA: Diagnosis not present

## 2022-09-30 LAB — BASIC METABOLIC PANEL
Anion gap: 11 (ref 5–15)
BUN: 13 mg/dL (ref 8–23)
CO2: 22 mmol/L (ref 22–32)
Calcium: 8.5 mg/dL — ABNORMAL LOW (ref 8.9–10.3)
Chloride: 99 mmol/L (ref 98–111)
Creatinine, Ser: 1.27 mg/dL — ABNORMAL HIGH (ref 0.61–1.24)
GFR, Estimated: 60 mL/min — ABNORMAL LOW (ref >60)
Glucose, Bld: 130 mg/dL — ABNORMAL HIGH (ref 70–99)
Potassium: 4 mmol/L (ref 3.5–5.1)
Sodium: 132 mmol/L — ABNORMAL LOW (ref 135–145)

## 2022-09-30 LAB — CBC
HCT: 30 % — ABNORMAL LOW (ref 39.0–52.0)
Hemoglobin: 10.4 g/dL — ABNORMAL LOW (ref 13.0–17.0)
MCH: 31.6 pg (ref 26.0–34.0)
MCHC: 34.7 g/dL (ref 30.0–36.0)
MCV: 91.2 fL (ref 80.0–100.0)
Platelets: 280 10*3/uL (ref 150–400)
RBC: 3.29 MIL/uL — ABNORMAL LOW (ref 4.22–5.81)
RDW: 13.1 % (ref 11.5–15.5)
WBC: 11.4 10*3/uL — ABNORMAL HIGH (ref 4.0–10.5)
nRBC: 0 % (ref 0.0–0.2)

## 2022-09-30 LAB — TROPONIN I (HIGH SENSITIVITY): Troponin I (High Sensitivity): 6538 ng/L (ref <18)

## 2022-09-30 MED ORDER — METOPROLOL SUCCINATE ER 25 MG PO TB24
12.5000 mg | ORAL_TABLET | Freq: Every day | ORAL | Status: DC
  Administered 2022-09-30 – 2022-10-01 (×2): 12.5 mg via ORAL
  Filled 2022-09-30 (×2): qty 1

## 2022-09-30 MED ORDER — PRASUGREL HCL 10 MG PO TABS
10.0000 mg | ORAL_TABLET | Freq: Every day | ORAL | Status: DC
  Administered 2022-10-01: 10 mg via ORAL
  Filled 2022-09-30: qty 1

## 2022-09-30 MED ORDER — ENOXAPARIN SODIUM 40 MG/0.4ML IJ SOSY
40.0000 mg | PREFILLED_SYRINGE | INTRAMUSCULAR | Status: DC
  Administered 2022-09-30: 40 mg via SUBCUTANEOUS
  Filled 2022-09-30: qty 0.4

## 2022-09-30 MED ORDER — PRASUGREL HCL 10 MG PO TABS: 60.0000 mg | ORAL_TABLET | Freq: Once | ORAL | Status: DC

## 2022-09-30 MED ORDER — PRASUGREL HCL 10 MG PO TABS
60.0000 mg | ORAL_TABLET | Freq: Once | ORAL | Status: AC
  Administered 2022-09-30: 60 mg via ORAL
  Filled 2022-09-30: qty 6

## 2022-09-30 NOTE — Progress Notes (Signed)
[facility] PROGRESS NOTE   Bryan Thompson WUJ:[id number] DOB: 12/19/1950 DOA: 09/29/2022  PCP: Chilton Greathouse, MD  Brief History/Interval Summary: 72 y.o. male with medical history significant of HTN, HLD, MGUS, and hypothyroidism presenting with headache and myalgias.  He also reported chest discomfort.  Found to have significantly elevated troponin levels.  Cardiology was consulted.  Patient was hospitalized for further management.   Consultants: Cardiology  Procedures: Cardiac catheterization with PCI    Subjective/Interval History: Patient denies any chest pain.  He did have some upper back pain overnight but denies any pain currently.  Headache has improved.  No fever or chills.    Assessment/Plan:  NSTEMI Patient with somewhat atypical symptoms but noted to have significantly elevated troponin levels.  EKG with nonspecific changes.  Seen by cardiology.  Underwent cardiac catheterization.  Underwent stent placement to circumflex. Patient seems to be stable from cardiac standpoint this morning. He is on aspirin, Brilinta, statin.  Not noted to be on beta-blockers or ACE inhibitors/ARB. Recurrent pain in his upper back between shoulder blade after his cardiac catheterization.  There was concern for pericarditis.  Patient started on colchicine.  Patient was given ibuprofen as well. Symptoms appear to have improved. Waiting on cardiology input this morning.  Headaches and shoulder pain Recently evaluated at urgent care center.  Lyme testing is negative.  RMSF testing was also negative. Patient was empirically started on doxycycline which is being continued.  Symptoms appear to be improving.  He does not have any focal neurological deficits.   Do not anticipate any further testing at this time.  Essential hypertension ARB currently on hold.  Will defer to cardiology.  Hyperlipidemia Continue atorvastatin.  Hypothyroidism Continue levothyroxine.  Normocytic  anemia No evidence for overt bleeding.  History of depression Continue sertraline.  MGUS Followed by oncology.  Obesity Estimated body mass index is 32.05 kg/m as calculated from the following:   Height as of this encounter: 5\' 10"  (1.778 m).   Weight as of this encounter: 101.3 kg.   DVT Prophylaxis: Lovenox Code Status: Full Code Family Communication: Discussed with patient and his wife Disposition Plan: Hopefully return home when cleared by cardiology  Status is: Inpatient Remains inpatient appropriate because: Acute coronary syndrome      Medications: Scheduled:  aspirin EC  81 mg Oral Daily   atorvastatin  80 mg Oral Daily   colchicine  0.6 mg Oral BID   doxycycline  100 mg Oral BID   enoxaparin (LOVENOX) injection  40 mg Subcutaneous Q24H   levothyroxine  150 mcg Oral Q0600   pantoprazole  40 mg Oral Q0600   sertraline  100 mg Oral Daily   sodium chloride flush  3 mL Intravenous Q12H   sodium chloride flush  3 mL Intravenous Q12H   sodium chloride flush  3 mL Intravenous Q12H   ticagrelor  90 mg Oral BID   Continuous:  sodium chloride     sodium chloride     tirofiban Stopped (09/30/22 0727)   NFA:OZHYQM chloride, sodium chloride, acetaminophen, morphine injection, nitroGLYCERIN, ondansetron (ZOFRAN) IV, mouth rinse, oxyCODONE, sodium chloride flush, sodium chloride flush  Antibiotics: Anti-infectives (From admission, onward)    Start     Dose/Rate Route Frequency Ordered Stop   09/29/22 1215  doxycycline (VIBRA-TABS) tablet 100 mg        100 mg Oral 2 times daily 09/29/22 1127         Objective:  Vital Signs  Vitals:   09/29/22 2032  09/30/22 0001 09/30/22 0332 09/30/22 0803  BP:  107/64 123/74 125/68  Pulse:  68 76   Resp:  18 19 20   Temp: 97.8 F (36.6 C) 97.7 F (36.5 C) 98.2 F (36.8 C) (!) 97.5 F (36.4 C)  TempSrc: Oral Oral Oral Oral  SpO2:  94% 98%   Weight:   101.3 kg   Height:        Intake/Output Summary (Last 24 hours)  at 09/30/2022 1034 Last data filed at 09/29/2022 1952 Gross per 24 hour  Intake 766.26 ml  Output 500 ml  Net 266.26 ml   Filed Weights   09/29/22 0334 09/29/22 1330 09/30/22 0332  Weight: 97.5 kg 100.7 kg 101.3 kg    General appearance: Awake alert.  In no distress Resp: Clear to auscultation bilaterally.  Normal effort Cardio: S1-S2 is normal regular.  No S3-S4.  No rubs murmurs or bruit GI: Abdomen is soft.  Nontender nondistended.  Bowel sounds are present normal.  No masses organomegaly Extremities: No edema.  Full range of motion of lower extremities. Neurologic: Alert and oriented x3.  No focal neurological deficits.    Lab Results:  Data Reviewed: I have personally reviewed following labs and reports of the imaging studies  CBC: Recent Labs  Lab 09/29/22 0419 09/29/22 1655 09/30/22 0332  WBC 13.3* 12.7* 11.4*  NEUTROABS 10.4*  --   --   HGB 11.7* 10.8* 10.4*  HCT 33.7* 32.2* 30.0*  MCV 90.8 93.6 91.2  PLT 263 294 280    Basic Metabolic Panel: Recent Labs  Lab 09/29/22 0419 09/29/22 1655 09/30/22 0332  NA 127*  --  132*  K 4.0  --  4.0  CL 96*  --  99  CO2 22  --  22  GLUCOSE 160*  --  130*  BUN 13  --  13  CREATININE 1.21 1.21 1.27*  CALCIUM 8.4*  --  8.5*    GFR: Estimated Creatinine Clearance: 61.8 mL/min (A) (by C-G formula based on SCr of 1.27 mg/dL (H)).  Liver Function Tests: Recent Labs  Lab 09/29/22 0419  AST 39  ALT 28  ALKPHOS 64  BILITOT 0.4  PROT 7.2  ALBUMIN 3.2*    Cardiac Enzymes: Recent Labs  Lab 09/29/22 1655  CKTOTAL 101    HbA1C: Recent Labs    09/29/22 1251  HGBA1C 6.3*    Lipid Profile: Recent Labs    09/29/22 1251  CHOL 133  HDL 29*  LDLCALC 80  TRIG 409  CHOLHDL 4.6    Thyroid Function Tests: Recent Labs    09/29/22 1251  TSH 1.187     Radiology Studies: CARDIAC CATHETERIZATION  Result Date: 09/29/2022   Ramus lesion is 40% stenosed.   Prox RCA lesion is 30% stenosed.   Mid RCA lesion is  20% stenosed.   Mid Cx to Dist Cx lesion is 99% stenosed.   Prox LAD to Mid LAD lesion is 30% stenosed.   A drug-eluting stent was successfully placed.   Post intervention, there is a 0% residual stenosis. Acute coronary syndrome secondary to total 99% left circumflex stenosis with significant thrombus burden. Mild concomitant CAD with 30% AT stenosis, 40% proximal ramus intermediate stenosis, and 30 and 20% mid RCA stenosis and a dominant RCA. LV EDP 28 mm Hg. Successful PCI to the left circumflex vessel with Pronto thrombectomy and ultimate insertion of a 2.5 x 20 mm Synergy DES stent postdilated to 2.71 mm with transient no flow and ultimate restoration of  brisk TIMI-3 flow with the 99% stenosis being reduced to 0%. RECOMMENDATION: DAPT with aspirin/Brilinta for minimum of 12 months.  Continue Aggrastat for 18 hours post procedure.  Aggressive lipid-lowering therapy with trial of statin rechallenge.  If unable to take statin, initiate PCSK9 inhibition.  Medical therapy for concomitant CAD.   ECHOCARDIOGRAM COMPLETE  Result Date: 09/29/2022    ECHOCARDIOGRAM REPORT   Patient Name:   ALYAN LUEBKE [facility] Date of Exam: 09/29/2022 Medical Rec #:  [id number]       Height:       70.0 in Accession #:    [id number]      Weight:       215.0 lb Date of Birth:  [birth year]      BSA:          2.152 m Patient Age:    71 years        BP:           109/58 mmHg Patient Gender: M               HR:           75 bpm. Exam Location:  Inpatient Procedure: 2D Echo, Cardiac Doppler, Color Doppler and Intracardiac            Opacification Agent STAT ECHO Indications:    Elevated Troponin  History:        Patient has no prior history of Echocardiogram examinations.                 Risk Factors:Hypertension and Dyslipidemia.  Sonographer:    Wallie Char Referring Phys: [id number] JENNIFER YATES  Sonographer Comments: Msgd Jearld Pies MD @ 12:33pm IMPRESSIONS  1. Left ventricular ejection fraction, by estimation, is 50 to 55%. The left ventricle has  low normal function. The left ventricle demonstrates regional wall motion abnormalities (see scoring diagram/findings for description). Left ventricular diastolic  parameters are consistent with Grade I diastolic dysfunction (impaired relaxation). There is severe hypokinesis of the left ventricular, entire inferolateral wall.  2. Right ventricular systolic function is normal. The right ventricular size is normal.  3. Left atrial size was mildly dilated.  4. The mitral valve is normal in structure. No evidence of mitral valve regurgitation. No evidence of mitral stenosis.  5. The aortic valve is tricuspid. There is mild calcification of the aortic valve. Aortic valve regurgitation is not visualized. No aortic stenosis is present. Aortic valve area, by VTI measures 1.80 cm. Aortic valve mean gradient measures 6.3 mmHg. Aortic valve Vmax measures 1.69 m/s.  6. The inferior vena cava is normal in size with greater than 50% respiratory variability, suggesting right atrial pressure of 3 mmHg. FINDINGS  Left Ventricle: Left ventricular ejection fraction, by estimation, is 50 to 55%. The left ventricle has low normal function. The left ventricle demonstrates regional wall motion abnormalities. Severe hypokinesis of the left ventricular, entire inferolateral wall. Definity contrast agent was given IV to delineate the left ventricular endocardial borders. The left ventricular internal cavity size was normal in size. There is no left ventricular hypertrophy. Left ventricular diastolic parameters are consistent with Grade I diastolic dysfunction (impaired relaxation). Right Ventricle: The right ventricular size is normal. No increase in right ventricular wall thickness. Right ventricular systolic function is normal. Left Atrium: Left atrial size was mildly dilated. Right Atrium: Right atrial size was normal in size. Pericardium: There is no evidence of pericardial effusion. Mitral Valve: The mitral valve is normal in  structure. No evidence of mitral valve  regurgitation. No evidence of mitral valve stenosis. MV peak gradient, 5.8 mmHg. The mean mitral valve gradient is 2.0 mmHg. Tricuspid Valve: The tricuspid valve is normal in structure. Tricuspid valve regurgitation is trivial. No evidence of tricuspid stenosis. Aortic Valve: The aortic valve is tricuspid. There is mild calcification of the aortic valve. Aortic valve regurgitation is not visualized. No aortic stenosis is present. Aortic valve mean gradient measures 6.3 mmHg. Aortic valve peak gradient measures 11.5 mmHg. Aortic valve area, by VTI measures 1.80 cm. Pulmonic Valve: The pulmonic valve was normal in structure. Pulmonic valve regurgitation is trivial. No evidence of pulmonic stenosis. Aorta: The aortic root is normal in size and structure. Venous: The inferior vena cava is normal in size with greater than 50% respiratory variability, suggesting right atrial pressure of 3 mmHg. IAS/Shunts: No atrial level shunt detected by color flow Doppler.  LEFT VENTRICLE PLAX 2D LVIDd:         4.30 cm      Diastology LVIDs:         2.90 cm      LV e' medial:    6.03 cm/s LV PW:         1.00 cm      LV E/e' medial:  18.9 LV IVS:        0.70 cm      LV e' lateral:   4.64 cm/s LVOT diam:     1.80 cm      LV E/e' lateral: 24.6 LV SV:         62 LV SV Index:   29 LVOT Area:     2.54 cm  LV Volumes (MOD) LV vol d, MOD A2C: 53.4 ml LV vol d, MOD A4C: 110.0 ml LV vol s, MOD A2C: 23.8 ml LV vol s, MOD A4C: 37.8 ml LV SV MOD A2C:     29.6 ml LV SV MOD A4C:     110.0 ml LV SV MOD BP:      48.8 ml RIGHT VENTRICLE             IVC RV Basal diam:  3.00 cm     IVC diam: 2.00 cm RV S prime:     13.50 cm/s TAPSE (M-mode): 1.9 cm LEFT ATRIUM             Index        RIGHT ATRIUM           Index LA diam:        3.50 cm 1.63 cm/m   RA Area:     11.40 cm LA Vol (A2C):   40.8 ml 18.95 ml/m  RA Volume:   25.00 ml  11.61 ml/m LA Vol (A4C):   42.6 ml 19.79 ml/m LA Biplane Vol: 42.5 ml 19.74 ml/m   AORTIC VALVE AV Area (Vmax):    1.69 cm AV Area (Vmean):   1.78 cm AV Area (VTI):     1.80 cm AV Vmax:           169.33 cm/s AV Vmean:          118.667 cm/s AV VTI:            0.343 m AV Peak Grad:      11.5 mmHg AV Mean Grad:      6.3 mmHg LVOT Vmax:         112.50 cm/s LVOT Vmean:        83.100 cm/s LVOT VTI:  0.242 m LVOT/AV VTI ratio: 0.71  AORTA Ao Root diam: 3.10 cm Ao Asc diam:  3.50 cm MITRAL VALVE                TRICUSPID VALVE MV Area (PHT): 4.04 cm     TR Peak grad:   33.6 mmHg MV Area VTI:   1.85 cm     TR Vmax:        290.00 cm/s MV Peak grad:  5.8 mmHg MV Mean grad:  2.0 mmHg     SHUNTS MV Vmax:       1.20 m/s     Systemic VTI:  0.24 m MV Vmean:      72.2 cm/s    Systemic Diam: 1.80 cm MV Decel Time: 188 msec MV E velocity: 114.00 cm/s MV A velocity: 122.00 cm/s MV E/A ratio:  0.93 Arvilla Meres MD Electronically signed by Arvilla Meres MD Signature Date/Time: 09/29/2022/12:49:50 PM    Final    DG Chest Port 1 View  Result Date: 09/29/2022 CLINICAL DATA:  Headache.  Generalized body aches. EXAM: PORTABLE CHEST 1 VIEW COMPARISON:  12/01/2020 FINDINGS: Borderline heart size accentuated by technique. Mild streaky density behind the heart that is similar to prior. No edema, effusion, or pneumothorax. IMPRESSION: Low volume chest with mild atelectasis or scarring behind the heart. Electronically Signed   By: Tiburcio Pea M.D.   On: 09/29/2022 04:21       LOS: 1 day   Osvaldo Shipper  [facility] Pager on [email or web address]  09/30/2022, 10:34 AM

## 2022-09-30 NOTE — Telephone Encounter (Signed)
Pharmacy Patient Advocate Encounter  Insurance verification completed.    The patient is insured through [facility]   The patient is currently admitted and ran test claims for the following: Colchicine, Brilinta.  Copays and coinsurance results were relayed to Inpatient clinical team.

## 2022-09-30 NOTE — Care Management (Signed)
  Transition of Care [facility]) Screening Note   Patient Details  Name: Bryan Thompson Date of Birth: 1951/02/16   Transition of Care [facility]) CM/SW Contact:    Gala Lewandowsky, RN Phone Number: 09/30/2022, 9:58 AM    Transition of Care Department [facility]) has reviewed the patient and no TOC needs have been identified at this time. Benefits check completed for Brilinta and Colchicine. We will continue to monitor patient advancement through interdisciplinary progression rounds. If new patient transition needs arise, please place a TOC consult.

## 2022-09-30 NOTE — Progress Notes (Signed)
CARDIAC REHAB PHASE I    Pt resting in bed, feeling well except for rapid heart rate that occurs off and on. Wife and pt concerned it is related to morning medications. Pt would like to walk later today after dose of metoprolol given. Asked Yovani Macfarland RN to assist with walk in hall later today. Post MI/stent education including site care, restrictions, risk factors, exercise guidelines, NTG use, MI booklet, antiplatelet therapy importance, heart healthy diet and CRP2 reviewed. All questions and concerns addressed. Will refer to [facility] for CRP2. Will continue to follow.   [id number]  Woodroe Chen, RN BSN 09/30/2022 12:54 PM

## 2022-09-30 NOTE — TOC Benefit Eligibility Note (Signed)
Patient Product/process development scientist completed.    The patient is currently admitted and upon discharge could be taking Colchicine daily.  The current 30 day co-pay is $5.01.   The patient is currently admitted and upon discharge could be taking Colchicine Twice a day.  The current 30 day co-pay is $10.00.   The patient is currently admitted and upon discharge could be taking Brilinta.  The current 30 day co-pay is $100.00.   The patient is insured through [employer]   This test claim was processed through [facility]- copay amounts may vary at other pharmacies due to pharmacy/plan contracts, or as the patient moves through the different stages of their insurance plan.

## 2022-09-30 NOTE — Progress Notes (Signed)
PHARMACY CONSULT NOTE   Pharmacy Consult for Aggrastat Indication:s/p PCI  Allergies  Allergen Reactions   Fish Allergy Swelling    Only fried fish   Prednisone Anxiety    Patient Measurements: Height: 5\' 10"  (177.8 cm) Weight: 101.3 kg (223 lb 6.4 oz) IBW/kg (Calculated) : 73  Vital Signs: Temp: 98.2 F (36.8 C) (05/09 0332) Temp Source: Oral (05/09 0332) BP: 123/74 (05/09 0332) Pulse Rate: 76 (05/09 0332)  Labs: Recent Labs    09/29/22 0419 09/29/22 0534 09/29/22 1251 09/29/22 1655 09/30/22 0330 09/30/22 0332  HGB 11.7*  --   --  10.8*  --  10.4*  HCT 33.7*  --   --  32.2*  --  30.0*  PLT 263  --   --  294  --  280  HEPARINUNFRC  --   --  <0.10*  --   --   --   CREATININE 1.21  --   --  1.21  --  1.27*  CKTOTAL  --   --   --  101  --   --   TROPONINIHS 7,851*   < > 5,790* 6,560* 6,538*  --    < > = values in this interval not displayed.     Estimated Creatinine Clearance: 61.8 mL/min (A) (by C-G formula based on SCr of 1.27 mg/dL (H)).   Medical History: Past Medical History:  Diagnosis Date   COVID-19    Depression    Hernia, umbilical    Hyperlipidemia    Hypertension    Thyroid disease     Medications:  No current facility-administered medications on file prior to encounter.   Current Outpatient Medications on File Prior to Encounter  Medication Sig Dispense Refill   acetaminophen (TYLENOL) 500 MG tablet Take 1,000 mg by mouth every 8 (eight) hours as needed for moderate pain, fever or headache.     doxycycline (VIBRAMYCIN) 100 MG capsule Take 1 capsule (100 mg total) by mouth 2 (two) times daily for 10 days. 20 capsule 0   irbesartan (AVAPRO) 150 MG tablet Take 150 tablets by mouth daily in the afternoon.     levothyroxine (SYNTHROID, LEVOTHROID) 150 MCG tablet Take 150 mcg by mouth daily before breakfast.     meloxicam (MOBIC) 15 MG tablet Take 15 mg by mouth daily as needed for pain.     Multiple Vitamins-Minerals (CENTRUM SILVER PO) Take 1  tablet by mouth daily in the afternoon.     sertraline (ZOLOFT) 100 MG tablet Take 100 mg by mouth daily in the afternoon.     vitamin B-12 (CYANOCOBALAMIN) 1000 MCG tablet Take 1,000 mcg by mouth daily in the afternoon.       Assessment: 72 y.o. male s/p cath lab with PCI to LCx.  Aggrastat initiated x 18 hrs.  CBC stable this AM, no overt bleeding or complications noted.  Goal of Therapy:  Heparin level 0.3-0.7 units/ml Monitor platelets by anticoagulation protocol: Yes   Plan:  Continue aggrastat x total 18 hrs.  Reece Leader, Colon Flattery, BCCP Clinical Pharmacist  09/30/2022 7:54 AM   [facility] pharmacy phone numbers are listed on [email or web address]

## 2022-09-30 NOTE — Progress Notes (Addendum)
Rounding Note    Patient Name: Bryan Thompson Date of Encounter: 09/30/2022  [facility] Cardiologist: Lance Muss, MD   Subjective   Chest pain has resolved.  Has felt some brief episodes of palpitations.  Inpatient Medications    Scheduled Meds:  aspirin EC  81 mg Oral Daily   atorvastatin  80 mg Oral Daily   colchicine  0.6 mg Oral BID   doxycycline  100 mg Oral BID   enoxaparin (LOVENOX) injection  40 mg Subcutaneous Q24H   levothyroxine  150 mcg Oral Q0600   metoprolol succinate  12.5 mg Oral Daily   pantoprazole  40 mg Oral Q0600   [START ON 10/01/2022] prasugrel  10 mg Oral Daily   prasugrel  60 mg Oral Once   sertraline  100 mg Oral Daily   sodium chloride flush  3 mL Intravenous Q12H   sodium chloride flush  3 mL Intravenous Q12H   sodium chloride flush  3 mL Intravenous Q12H   Continuous Infusions:  sodium chloride     sodium chloride     PRN Meds: sodium chloride, sodium chloride, acetaminophen, morphine injection, nitroGLYCERIN, ondansetron (ZOFRAN) IV, mouth rinse, oxyCODONE, sodium chloride flush, sodium chloride flush   Vital Signs    Vitals:   09/29/22 2032 09/30/22 0001 09/30/22 0332 09/30/22 0803  BP:  107/64 123/74 125/68  Pulse:  68 76   Resp:  18 19 20   Temp: 97.8 F (36.6 C) 97.7 F (36.5 C) 98.2 F (36.8 C) (!) 97.5 F (36.4 C)  TempSrc: Oral Oral Oral Oral  SpO2:  94% 98%   Weight:   101.3 kg   Height:        Intake/Output Summary (Last 24 hours) at 09/30/2022 1159 Last data filed at 09/29/2022 1952 Gross per 24 hour  Intake 741.6 ml  Output 500 ml  Net 241.6 ml      09/30/2022    3:32 AM 09/29/2022    1:30 PM 09/29/2022    3:34 AM  Last 3 Weights  Weight (lbs) 223 lb 6.4 oz 222 lb 215 lb  Weight (kg) 101.334 kg 100.699 kg 97.523 kg      Telemetry    Predominantly normal sinus rhythm, brief episodes of SVT- Personally Reviewed  ECG    Normal sinus rhythm, no significant ST changes, improved from prior ECG-  Personally Reviewed  Physical Exam   GEN: No acute distress.   Neck: No JVD Cardiac: RRR, no murmurs, rubs, or gallops.  Episodes of tachycardia up to the 118 range noted during the exam Respiratory: Clear to auscultation bilaterally. GI: Soft, nontender, non-distended  MS: No edema; No deformity.  No right groin hematoma Neuro:  Nonfocal  Psych: Normal affect   Labs    High Sensitivity Troponin:   Recent Labs  Lab 09/29/22 0419 09/29/22 0534 09/29/22 1251 09/29/22 1655 09/30/22 0330  TROPONINIHS 7,851* 7,207* 5,790* 6,560* 6,538*     Chemistry Recent Labs  Lab 09/29/22 0419 09/29/22 1655 09/30/22 0332  NA 127*  --  132*  K 4.0  --  4.0  CL 96*  --  99  CO2 22  --  22  GLUCOSE 160*  --  130*  BUN 13  --  13  CREATININE 1.21 1.21 1.27*  CALCIUM 8.4*  --  8.5*  PROT 7.2  --   --   ALBUMIN 3.2*  --   --   AST 39  --   --   ALT  28  --   --   ALKPHOS 64  --   --   BILITOT 0.4  --   --   GFRNONAA >60 >60 60*  ANIONGAP 9  --  11    Lipids  Recent Labs  Lab 09/29/22 1251  CHOL 133  TRIG 122  HDL 29*  LDLCALC 80  CHOLHDL 4.6    Hematology Recent Labs  Lab 09/29/22 0419 09/29/22 1655 09/30/22 0332  WBC 13.3* 12.7* 11.4*  RBC 3.71* 3.44* 3.29*  HGB 11.7* 10.8* 10.4*  HCT 33.7* 32.2* 30.0*  MCV 90.8 93.6 91.2  MCH 31.5 31.4 31.6  MCHC 34.7 33.5 34.7  RDW 12.7 13.0 13.1  PLT 263 294 280   Thyroid  Recent Labs  Lab 09/29/22 1251  TSH 1.187    BNPNo results for input(s): "BNP", "PROBNP" in the last 168 hours.  DDimer No results for input(s): "DDIMER" in the last 168 hours.   Radiology    CARDIAC CATHETERIZATION  Result Date: 09/29/2022   Ramus lesion is 40% stenosed.   Prox RCA lesion is 30% stenosed.   Mid RCA lesion is 20% stenosed.   Mid Cx to Dist Cx lesion is 99% stenosed.   Prox LAD to Mid LAD lesion is 30% stenosed.   A drug-eluting stent was successfully placed.   Post intervention, there is a 0% residual stenosis. Acute coronary syndrome  secondary to total 99% left circumflex stenosis with significant thrombus burden. Mild concomitant CAD with 30% AT stenosis, 40% proximal ramus intermediate stenosis, and 30 and 20% mid RCA stenosis and a dominant RCA. LV EDP 28 mm Hg. Successful PCI to the left circumflex vessel with Pronto thrombectomy and ultimate insertion of a 2.5 x 20 mm Synergy DES stent postdilated to 2.71 mm with transient no flow and ultimate restoration of brisk TIMI-3 flow with the 99% stenosis being reduced to 0%. RECOMMENDATION: DAPT with aspirin/Brilinta for minimum of 12 months.  Continue Aggrastat for 18 hours post procedure.  Aggressive lipid-lowering therapy with trial of statin rechallenge.  If unable to take statin, initiate PCSK9 inhibition.  Medical therapy for concomitant CAD.   ECHOCARDIOGRAM COMPLETE  Result Date: 09/29/2022    ECHOCARDIOGRAM REPORT   Patient Name:   Bryan Thompson [facility] Date of Exam: 09/29/2022 Medical Rec #:  [id number]       Height:       70.0 in Accession #:    [id number]      Weight:       215.0 lb Date of Birth:  November 05, 1950      BSA:          2.152 m Patient Age:    72 years        BP:           109/58 mmHg Patient Gender: M               HR:           75 bpm. Exam Location:  Inpatient Procedure: 2D Echo, Cardiac Doppler, Color Doppler and Intracardiac            Opacification Agent STAT ECHO Indications:    Elevated Troponin  History:        Patient has no prior history of Echocardiogram examinations.                 Risk Factors:Hypertension and Dyslipidemia.  Sonographer:    Wallie Char Referring Phys: [id number] JENNIFER YATES  Sonographer Comments: April Manson MD @  12:33pm IMPRESSIONS  1. Left ventricular ejection fraction, by estimation, is 50 to 55%. The left ventricle has low normal function. The left ventricle demonstrates regional wall motion abnormalities (see scoring diagram/findings for description). Left ventricular diastolic  parameters are consistent with Grade I diastolic dysfunction  (impaired relaxation). There is severe hypokinesis of the left ventricular, entire inferolateral wall.  2. Right ventricular systolic function is normal. The right ventricular size is normal.  3. Left atrial size was mildly dilated.  4. The mitral valve is normal in structure. No evidence of mitral valve regurgitation. No evidence of mitral stenosis.  5. The aortic valve is tricuspid. There is mild calcification of the aortic valve. Aortic valve regurgitation is not visualized. No aortic stenosis is present. Aortic valve area, by VTI measures 1.80 cm. Aortic valve mean gradient measures 6.3 mmHg. Aortic valve Vmax measures 1.69 m/s.  6. The inferior vena cava is normal in size with greater than 50% respiratory variability, suggesting right atrial pressure of 3 mmHg. FINDINGS  Left Ventricle: Left ventricular ejection fraction, by estimation, is 50 to 55%. The left ventricle has low normal function. The left ventricle demonstrates regional wall motion abnormalities. Severe hypokinesis of the left ventricular, entire inferolateral wall. Definity contrast agent was given IV to delineate the left ventricular endocardial borders. The left ventricular internal cavity size was normal in size. There is no left ventricular hypertrophy. Left ventricular diastolic parameters are consistent with Grade I diastolic dysfunction (impaired relaxation). Right Ventricle: The right ventricular size is normal. No increase in right ventricular wall thickness. Right ventricular systolic function is normal. Left Atrium: Left atrial size was mildly dilated. Right Atrium: Right atrial size was normal in size. Pericardium: There is no evidence of pericardial effusion. Mitral Valve: The mitral valve is normal in structure. No evidence of mitral valve regurgitation. No evidence of mitral valve stenosis. MV peak gradient, 5.8 mmHg. The mean mitral valve gradient is 2.0 mmHg. Tricuspid Valve: The tricuspid valve is normal in structure. Tricuspid  valve regurgitation is trivial. No evidence of tricuspid stenosis. Aortic Valve: The aortic valve is tricuspid. There is mild calcification of the aortic valve. Aortic valve regurgitation is not visualized. No aortic stenosis is present. Aortic valve mean gradient measures 6.3 mmHg. Aortic valve peak gradient measures 11.5 mmHg. Aortic valve area, by VTI measures 1.80 cm. Pulmonic Valve: The pulmonic valve was normal in structure. Pulmonic valve regurgitation is trivial. No evidence of pulmonic stenosis. Aorta: The aortic root is normal in size and structure. Venous: The inferior vena cava is normal in size with greater than 50% respiratory variability, suggesting right atrial pressure of 3 mmHg. IAS/Shunts: No atrial level shunt detected by color flow Doppler.  LEFT VENTRICLE PLAX 2D LVIDd:         4.30 cm      Diastology LVIDs:         2.90 cm      LV e' medial:    6.03 cm/s LV PW:         1.00 cm      LV E/e' medial:  18.9 LV IVS:        0.70 cm      LV e' lateral:   4.64 cm/s LVOT diam:     1.80 cm      LV E/e' lateral: 24.6 LV SV:         62 LV SV Index:   29 LVOT Area:     2.54 cm  LV Volumes (MOD) LV vol  d, MOD A2C: 53.4 ml LV vol d, MOD A4C: 110.0 ml LV vol s, MOD A2C: 23.8 ml LV vol s, MOD A4C: 37.8 ml LV SV MOD A2C:     29.6 ml LV SV MOD A4C:     110.0 ml LV SV MOD BP:      48.8 ml RIGHT VENTRICLE             IVC RV Basal diam:  3.00 cm     IVC diam: 2.00 cm RV S prime:     13.50 cm/s TAPSE (M-mode): 1.9 cm LEFT ATRIUM             Index        RIGHT ATRIUM           Index LA diam:        3.50 cm 1.63 cm/m   RA Area:     11.40 cm LA Vol (A2C):   40.8 ml 18.95 ml/m  RA Volume:   25.00 ml  11.61 ml/m LA Vol (A4C):   42.6 ml 19.79 ml/m LA Biplane Vol: 42.5 ml 19.74 ml/m  AORTIC VALVE AV Area (Vmax):    1.69 cm AV Area (Vmean):   1.78 cm AV Area (VTI):     1.80 cm AV Vmax:           169.33 cm/s AV Vmean:          118.667 cm/s AV VTI:            0.343 m AV Peak Grad:      11.5 mmHg AV Mean Grad:       6.3 mmHg LVOT Vmax:         112.50 cm/s LVOT Vmean:        83.100 cm/s LVOT VTI:          0.242 m LVOT/AV VTI ratio: 0.71  AORTA Ao Root diam: 3.10 cm Ao Asc diam:  3.50 cm MITRAL VALVE                TRICUSPID VALVE MV Area (PHT): 4.04 cm     TR Peak grad:   33.6 mmHg MV Area VTI:   1.85 cm     TR Vmax:        290.00 cm/s MV Peak grad:  5.8 mmHg MV Mean grad:  2.0 mmHg     SHUNTS MV Vmax:       1.20 m/s     Systemic VTI:  0.24 m MV Vmean:      72.2 cm/s    Systemic Diam: 1.80 cm MV Decel Time: 188 msec MV E velocity: 114.00 cm/s MV A velocity: 122.00 cm/s MV E/A ratio:  0.93 Arvilla Meres MD Electronically signed by Arvilla Meres MD Signature Date/Time: 09/29/2022/12:49:50 PM    Final    DG Chest Port 1 View  Result Date: 09/29/2022 CLINICAL DATA:  Headache.  Generalized body aches. EXAM: PORTABLE CHEST 1 VIEW COMPARISON:  12/01/2020 FINDINGS: Borderline heart size accentuated by technique. Mild streaky density behind the heart that is similar to prior. No edema, effusion, or pneumothorax. IMPRESSION: Low volume chest with mild atelectasis or scarring behind the heart. Electronically Signed   By: Tiburcio Pea M.D.   On: 09/29/2022 04:21    Cardiac Studies   Cath films reviewed  Patient Profile     72 y.o. male with NSTEMI status post circumflex stent  Assessment & Plan    CAD/NSTEMI: Continue dual antiplatelet therapy.  Due to price,  will change Brilinta to Effient.  Can give 60 mg Effient tonight as the patient is requested to stay an extra day.  Can discharge on Effient 10 mg daily along with aggressive secondary prevention.  Patient's wife had questions about a lot of his medicines.  She attributed the SVT to taking his morning medicines.  Nothing he took would cause heart rate issues.  She is concerned about statins.  Will refer to lipid clinic to see if he qualifies.  Pericarditis: Mild symptoms improved.  Will continue colchicine for 3 months.  Since he is not having pain, will not  give more ibuprofen at this time to try to minimize the risk of GI issues.  MI symptoms were probably going on for a few days prior to revascularization.  If his pericarditis symptoms come back, could restart ibuprofen.  SVT: start metoprolol 12.5 mg BID  For questions or updates, please contact [facility] Please consult [email or web address] for contact info under        Signed, Lance Muss, MD  09/30/2022, 11:59 AM

## 2022-10-01 ENCOUNTER — Other Ambulatory Visit (HOSPITAL_COMMUNITY): Payer: Self-pay

## 2022-10-01 ENCOUNTER — Telehealth: Payer: Self-pay

## 2022-10-01 ENCOUNTER — Telehealth (HOSPITAL_COMMUNITY): Payer: Self-pay | Admitting: Pharmacy Technician

## 2022-10-01 DIAGNOSIS — I10 Essential (primary) hypertension: Secondary | ICD-10-CM | POA: Diagnosis not present

## 2022-10-01 DIAGNOSIS — I471 Supraventricular tachycardia, unspecified: Secondary | ICD-10-CM | POA: Diagnosis not present

## 2022-10-01 DIAGNOSIS — I249 Acute ischemic heart disease, unspecified: Secondary | ICD-10-CM | POA: Diagnosis not present

## 2022-10-01 DIAGNOSIS — E785 Hyperlipidemia, unspecified: Secondary | ICD-10-CM | POA: Diagnosis not present

## 2022-10-01 LAB — MAGNESIUM: Magnesium: 1.8 mg/dL (ref 1.7–2.4)

## 2022-10-01 LAB — LIPOPROTEIN A (LPA): Lipoprotein (a): 8.9 nmol/L (ref <75.0)

## 2022-10-01 MED ORDER — METOPROLOL SUCCINATE ER 25 MG PO TB24
12.5000 mg | ORAL_TABLET | Freq: Every day | ORAL | 2 refills | Status: DC
  Filled 2022-10-01: qty 30, 60d supply, fill #0
  Filled 2022-10-28 – 2022-11-22 (×2): qty 30, 60d supply, fill #1

## 2022-10-01 MED ORDER — PRASUGREL HCL 10 MG PO TABS
10.0000 mg | ORAL_TABLET | Freq: Every day | ORAL | 2 refills | Status: DC
  Filled 2022-10-01: qty 30, 30d supply, fill #0
  Filled 2022-10-28: qty 30, 30d supply, fill #1
  Filled 2022-11-22: qty 30, 30d supply, fill #2

## 2022-10-01 MED ORDER — ATORVASTATIN CALCIUM 80 MG PO TABS
80.0000 mg | ORAL_TABLET | Freq: Every day | ORAL | 2 refills | Status: DC
  Filled 2022-10-01: qty 30, 30d supply, fill #0

## 2022-10-01 MED ORDER — COLCHICINE 0.6 MG PO TABS
0.6000 mg | ORAL_TABLET | Freq: Two times a day (BID) | ORAL | 2 refills | Status: DC
  Filled 2022-10-01: qty 60, 30d supply, fill #0

## 2022-10-01 MED ORDER — ASPIRIN 81 MG PO TBEC
81.0000 mg | DELAYED_RELEASE_TABLET | Freq: Every day | ORAL | 12 refills | Status: DC
  Filled 2022-10-01: qty 30, 30d supply, fill #0

## 2022-10-01 NOTE — Progress Notes (Signed)
CARDIAC REHAB PHASE I   PRE:  Rate/Rhythm: 82 SR  BP:  Sitting: 113/64      SaO2: 96 RA  MODE:  Ambulation: 200 ft   POST:  Rate/Rhythm: 96 SR  BP:  Sitting: 132/64      SaO2: 95 RA  Pt ambulated independently in hall, tolerated well with no dizziness, CP or SOB. Only complaint was of feeling weak and tired. Returned to chair with call bell and personal ideas in reach. Reviewed education provided yesterday in great detail. All questions and concerns addressed. Plan for discharge later today.   0900-1030  Woodroe Chen, RN BSN 10/01/2022 10:22 AM

## 2022-10-01 NOTE — Telephone Encounter (Signed)
-----   Message from Jonita Albee, PA-C sent at 10/01/2022  9:25 AM EDT ----- Regarding: [facility] phone call This patient is being discharged from [facility] today and has a followup on 5/21 with Herma Carson. Needs TOC phone call   Thanks! KJ

## 2022-10-01 NOTE — TOC Benefit Eligibility Note (Signed)
Patient Product/process development scientist completed.    The patient is currently admitted and upon discharge could be taking prasugrel (Effient) 10 mg tablets.  The current 30 day co-pay is $6.63.   The patient is insured through [employer] Part D   This test claim was processed through [facility]- copay amounts may vary at other pharmacies due to pharmacy/plan contracts, or as the patient moves through the different stages of their insurance plan.  Roland Earl, CPHT Pharmacy Patient Advocate Specialist [facility] Patient Advocate Team Direct Number: [phone]  Fax: [phone]

## 2022-10-01 NOTE — Telephone Encounter (Signed)
Discharged from [facility] on 5/10, he will need a TOC call on 5/13.

## 2022-10-01 NOTE — Telephone Encounter (Signed)
Pharmacy Patient Advocate Encounter  Insurance verification completed.    The patient is insured through [employer] Part D   The patient is currently admitted and ran test claims for the following: prasugrel (Effient) .  Copays and coinsurance results were relayed to Inpatient clinical team.

## 2022-10-01 NOTE — Discharge Instructions (Addendum)
Call [facility] at [phone] if any bleeding, swelling or drainage at cath site.  May shower, no tub baths for 48 hours for groin sticks. No lifting over 5 pounds for 5 days.  No Driving for 5 days  Take 1 NTG, under your tongue, while sitting.  If no relief of pain may repeat NTG, one tab every 5 minutes up to 3 tablets total over 15 minutes.  If no relief CALL 911.  If you have dizziness/lightheadness  while taking NTG, stop taking and call 911.         Lipid clinic appt will be arranged and office will call  Do Not miss any Effient or asprin, this keeps your stent open.   Call Dr. Hoyle Barr office for any problems

## 2022-10-01 NOTE — Discharge Summary (Signed)
[facility]  Physician Discharge Summary   Patient ID: Bryan Thompson MRN: [id number] DOB/AGE: Dec 14, 1950 72 y.o.  Admit date: 09/29/2022 Discharge date: 10/01/2022    PCP: Chilton Greathouse, MD  DISCHARGE DIAGNOSES:  NSTEMI   Essential hypertension   Dyslipidemia   Hypothyroidism   Depression   SVT (supraventricular tachycardia)   RECOMMENDATIONS FOR OUTPATIENT FOLLOW UP: Cardiology to arrange outpatient follow-up   Home Health: None Equipment/Devices: None  CODE STATUS: Full code  DISCHARGE CONDITION: fair  Diet recommendation: Heart healthy  INITIAL HISTORY: 72 y.o. male with medical history significant of HTN, HLD, MGUS, and hypothyroidism presenting with headache and myalgias.  He also reported chest discomfort.  Found to have significantly elevated troponin levels.  Cardiology was consulted.  Patient was hospitalized for further management.    Consultants: Cardiology   Procedures: Cardiac catheterization with PCI   HOSPITAL COURSE:   NSTEMI/episodes of SVT/concern for pericarditis Patient with somewhat atypical symptoms but noted to have significantly elevated troponin levels.  EKG with nonspecific changes.  Seen by cardiology.  Underwent cardiac catheterization.  Underwent stent placement to circumflex. Patient to be discharged on aspirin and Effient and statin.  Also started on metoprolol for SVTs noted on telemetry. He is on aspirin, Brilinta, statin.  Not noted to be on beta-blockers or ACE inhibitors/ARB. Recurrent pain in his upper back between shoulder blade after his cardiac catheterization.  There was concern for pericarditis.  Patient started on colchicine.  Patient was given ibuprofen as well. Symptoms appear to have improved. Will be discharged on colchicine.   Headaches and shoulder pain Recently evaluated at urgent care center.  Lyme testing is negative.  RMSF testing was also negative. Patient was empirically started on doxycycline  which is being continued.  Symptoms appear to be improving.  He does not have any focal neurological deficits.   Do not anticipate any further testing at this time.   Essential hypertension ARB discontinued.  Patient now on metoprolol.   Hyperlipidemia Continue atorvastatin.   Hypothyroidism Continue levothyroxine.   Normocytic anemia No evidence for overt bleeding.   History of depression Continue sertraline.   MGUS Followed by oncology.   Obesity Estimated body mass index is 32.05 kg/m as calculated from the following:   Height as of this encounter: 5\' 10"  (1.778 m).   Weight as of this encounter: 101.3 kg.    Patient is stable.  Okay for discharge home today.  Cleared by cardiology.   PERTINENT LABS:  The results of significant diagnostics from this hospitalization (including imaging, microbiology, ancillary and laboratory) are listed below for reference.    Labs:   Basic Metabolic Panel: Recent Labs  Lab 09/29/22 0419 09/29/22 1655 09/30/22 0332 10/01/22 0210  NA 127*  --  132*  --   K 4.0  --  4.0  --   CL 96*  --  99  --   CO2 22  --  22  --   GLUCOSE 160*  --  130*  --   BUN 13  --  13  --   CREATININE 1.21 1.21 1.27*  --   CALCIUM 8.4*  --  8.5*  --   MG  --   --   --  1.8   Liver Function Tests: Recent Labs  Lab 09/29/22 0419  AST 39  ALT 28  ALKPHOS 64  BILITOT 0.4  PROT 7.2  ALBUMIN 3.2*    CBC: Recent Labs  Lab 09/29/22 0419 09/29/22 1655 09/30/22 0332  WBC 13.3* 12.7* 11.4*  NEUTROABS 10.4*  --   --   HGB 11.7* 10.8* 10.4*  HCT 33.7* 32.2* 30.0*  MCV 90.8 93.6 91.2  PLT 263 294 280   Cardiac Enzymes: Recent Labs  Lab 09/29/22 1655  CKTOTAL 101     IMAGING STUDIES CARDIAC CATHETERIZATION  Result Date: 09/29/2022   Ramus lesion is 40% stenosed.   Prox RCA lesion is 30% stenosed.   Mid RCA lesion is 20% stenosed.   Mid Cx to Dist Cx lesion is 99% stenosed.   Prox LAD to Mid LAD lesion is 30% stenosed.   A drug-eluting  stent was successfully placed.   Post intervention, there is a 0% residual stenosis. Acute coronary syndrome secondary to total 99% left circumflex stenosis with significant thrombus burden. Mild concomitant CAD with 30% AT stenosis, 40% proximal ramus intermediate stenosis, and 30 and 20% mid RCA stenosis and a dominant RCA. LV EDP 28 mm Hg. Successful PCI to the left circumflex vessel with Pronto thrombectomy and ultimate insertion of a 2.5 x 20 mm Synergy DES stent postdilated to 2.71 mm with transient no flow and ultimate restoration of brisk TIMI-3 flow with the 99% stenosis being reduced to 0%. RECOMMENDATION: DAPT with aspirin/Brilinta for minimum of 12 months.  Continue Aggrastat for 18 hours post procedure.  Aggressive lipid-lowering therapy with trial of statin rechallenge.  If unable to take statin, initiate PCSK9 inhibition.  Medical therapy for concomitant CAD.   ECHOCARDIOGRAM COMPLETE  Result Date: 09/29/2022    ECHOCARDIOGRAM REPORT   Patient Name:   Bryan Thompson [facility] Date of Exam: 09/29/2022 Medical Rec #:  [id number]       Height:       70.0 in Accession #:    [id number]      Weight:       215.0 lb Date of Birth:  November 01, 1950      BSA:          2.152 m Patient Age:    71 years        BP:           109/58 mmHg Patient Gender: M               HR:           75 bpm. Exam Location:  Inpatient Procedure: 2D Echo, Cardiac Doppler, Color Doppler and Intracardiac            Opacification Agent STAT ECHO Indications:    Elevated Troponin  History:        Patient has no prior history of Echocardiogram examinations.                 Risk Factors:Hypertension and Dyslipidemia.  Sonographer:    Wallie Char Referring Phys: [id number] JENNIFER YATES  Sonographer Comments: Msgd Jearld Pies MD @ 12:33pm IMPRESSIONS  1. Left ventricular ejection fraction, by estimation, is 50 to 55%. The left ventricle has low normal function. The left ventricle demonstrates regional wall motion abnormalities (see scoring diagram/findings for  description). Left ventricular diastolic  parameters are consistent with Grade I diastolic dysfunction (impaired relaxation). There is severe hypokinesis of the left ventricular, entire inferolateral wall.  2. Right ventricular systolic function is normal. The right ventricular size is normal.  3. Left atrial size was mildly dilated.  4. The mitral valve is normal in structure. No evidence of mitral valve regurgitation. No evidence of mitral stenosis.  5. The aortic valve is tricuspid. There is mild calcification of the  aortic valve. Aortic valve regurgitation is not visualized. No aortic stenosis is present. Aortic valve area, by VTI measures 1.80 cm. Aortic valve mean gradient measures 6.3 mmHg. Aortic valve Vmax measures 1.69 m/s.  6. The inferior vena cava is normal in size with greater than 50% respiratory variability, suggesting right atrial pressure of 3 mmHg. FINDINGS  Left Ventricle: Left ventricular ejection fraction, by estimation, is 50 to 55%. The left ventricle has low normal function. The left ventricle demonstrates regional wall motion abnormalities. Severe hypokinesis of the left ventricular, entire inferolateral wall. Definity contrast agent was given IV to delineate the left ventricular endocardial borders. The left ventricular internal cavity size was normal in size. There is no left ventricular hypertrophy. Left ventricular diastolic parameters are consistent with Grade I diastolic dysfunction (impaired relaxation). Right Ventricle: The right ventricular size is normal. No increase in right ventricular wall thickness. Right ventricular systolic function is normal. Left Atrium: Left atrial size was mildly dilated. Right Atrium: Right atrial size was normal in size. Pericardium: There is no evidence of pericardial effusion. Mitral Valve: The mitral valve is normal in structure. No evidence of mitral valve regurgitation. No evidence of mitral valve stenosis. MV peak gradient, 5.8 mmHg. The mean  mitral valve gradient is 2.0 mmHg. Tricuspid Valve: The tricuspid valve is normal in structure. Tricuspid valve regurgitation is trivial. No evidence of tricuspid stenosis. Aortic Valve: The aortic valve is tricuspid. There is mild calcification of the aortic valve. Aortic valve regurgitation is not visualized. No aortic stenosis is present. Aortic valve mean gradient measures 6.3 mmHg. Aortic valve peak gradient measures 11.5 mmHg. Aortic valve area, by VTI measures 1.80 cm. Pulmonic Valve: The pulmonic valve was normal in structure. Pulmonic valve regurgitation is trivial. No evidence of pulmonic stenosis. Aorta: The aortic root is normal in size and structure. Venous: The inferior vena cava is normal in size with greater than 50% respiratory variability, suggesting right atrial pressure of 3 mmHg. IAS/Shunts: No atrial level shunt detected by color flow Doppler.  LEFT VENTRICLE PLAX 2D LVIDd:         4.30 cm      Diastology LVIDs:         2.90 cm      LV e' medial:    6.03 cm/s LV PW:         1.00 cm      LV E/e' medial:  18.9 LV IVS:        0.70 cm      LV e' lateral:   4.64 cm/s LVOT diam:     1.80 cm      LV E/e' lateral: 24.6 LV SV:         62 LV SV Index:   29 LVOT Area:     2.54 cm  LV Volumes (MOD) LV vol d, MOD A2C: 53.4 ml LV vol d, MOD A4C: 110.0 ml LV vol s, MOD A2C: 23.8 ml LV vol s, MOD A4C: 37.8 ml LV SV MOD A2C:     29.6 ml LV SV MOD A4C:     110.0 ml LV SV MOD BP:      48.8 ml RIGHT VENTRICLE             IVC RV Basal diam:  3.00 cm     IVC diam: 2.00 cm RV S prime:     13.50 cm/s TAPSE (M-mode): 1.9 cm LEFT ATRIUM             Index  RIGHT ATRIUM           Index LA diam:        3.50 cm 1.63 cm/m   RA Area:     11.40 cm LA Vol (A2C):   40.8 ml 18.95 ml/m  RA Volume:   25.00 ml  11.61 ml/m LA Vol (A4C):   42.6 ml 19.79 ml/m LA Biplane Vol: 42.5 ml 19.74 ml/m  AORTIC VALVE AV Area (Vmax):    1.69 cm AV Area (Vmean):   1.78 cm AV Area (VTI):     1.80 cm AV Vmax:           169.33 cm/s  AV Vmean:          118.667 cm/s AV VTI:            0.343 m AV Peak Grad:      11.5 mmHg AV Mean Grad:      6.3 mmHg LVOT Vmax:         112.50 cm/s LVOT Vmean:        83.100 cm/s LVOT VTI:          0.242 m LVOT/AV VTI ratio: 0.71  AORTA Ao Root diam: 3.10 cm Ao Asc diam:  3.50 cm MITRAL VALVE                TRICUSPID VALVE MV Area (PHT): 4.04 cm     TR Peak grad:   33.6 mmHg MV Area VTI:   1.85 cm     TR Vmax:        290.00 cm/s MV Peak grad:  5.8 mmHg MV Mean grad:  2.0 mmHg     SHUNTS MV Vmax:       1.20 m/s     Systemic VTI:  0.24 m MV Vmean:      72.2 cm/s    Systemic Diam: 1.80 cm MV Decel Time: 188 msec MV E velocity: 114.00 cm/s MV A velocity: 122.00 cm/s MV E/A ratio:  0.93 Arvilla Meres MD Electronically signed by Arvilla Meres MD Signature Date/Time: 09/29/2022/12:49:50 PM    Final    DG Chest Port 1 View  Result Date: 09/29/2022 CLINICAL DATA:  Headache.  Generalized body aches. EXAM: PORTABLE CHEST 1 VIEW COMPARISON:  12/01/2020 FINDINGS: Borderline heart size accentuated by technique. Mild streaky density behind the heart that is similar to prior. No edema, effusion, or pneumothorax. IMPRESSION: Low volume chest with mild atelectasis or scarring behind the heart. Electronically Signed   By: Tiburcio Pea M.D.   On: 09/29/2022 04:21    DISCHARGE EXAMINATION: Vitals:   09/30/22 1652 09/30/22 2000 10/01/22 0500 10/01/22 0849  BP: 136/66 127/73 121/76 113/64  Pulse:  81 69 85  Resp: 19 (!) 21 19   Temp: (!) 97.5 F (36.4 C) 98.4 F (36.9 C) 98.2 F (36.8 C)   TempSrc: Oral Oral Oral   SpO2:  95% 95%   Weight:   100.2 kg   Height:       General appearance: Awake alert.  In no distress Resp: Clear to auscultation bilaterally.  Normal effort Cardio: S1-S2 is normal regular.  No S3-S4.  No rubs murmurs or bruit GI: Abdomen is soft.  Nontender nondistended.  Bowel sounds are present normal.  No masses organomegaly   DISPOSITION: Home  Discharge Instructions     AMB Referral  to Advanced Lipid Disorders Clinic   Complete by: As directed    Reason for referral: Patients with statin intolerance (failed 2 statins,  one of which must be a high potency statin)   Provider to see patient: PharmD   Internal Lipid Clinic Referral Scheduling  Internal lipid clinic referrals are providers within [facility], who wish to refer established patients for routine management (help in starting PCSK9 inhibitor therapy) or advanced therapies.  Internal MD referral criteria:              1. All patients with LDL>190 mg/dL  2. All patients with Triglycerides >500 mg/dL  3. Patients with suspected or confirmed heterozygous familial hyperlipidemia (HeFH) or homozygous familial hyperlipidemia (HoFH)  4. Patients with family history of suspicious for genetic dyslipidemia desiring genetic testing  5. Patients refractory to standard guideline based therapy  6. Patients with statin intolerance (failed 2 statins, one of which must be a high potency statin)  7. Patients who the provider desires to be seen by MD   Internal PharmD referral criteria:   1. Follow-up patients for medication management  2. Follow-up for compliance monitoring  3. Patients for drug education  4. Patients with statin intolerance  5. PCSK9 inhibitor education and prior authorization approvals  6. Patients with triglycerides <500 mg/dL  External Lipid Clinic Referral  External lipid clinic referrals are for providers outside of [facility], considered new clinic patients - automatically routed to MD schedule   Amb Referral to Cardiac Rehabilitation   Complete by: As directed    Diagnosis:  Coronary Stents NSTEMI     After initial evaluation and assessments completed: Virtual Based Care may be provided alone or in conjunction with Phase 2 Cardiac Rehab based on patient barriers.: Yes   Intensive Cardiac Rehabilitation (ICR) [facility] location only OR Traditional Cardiac Rehabilitation (TCR) *If criteria for ICR are not  met will enroll in TCR [facility] only): Yes   Call MD for:  difficulty breathing, headache or visual disturbances   Complete by: As directed    Call MD for:  extreme fatigue   Complete by: As directed    Call MD for:  persistant dizziness or light-headedness   Complete by: As directed    Call MD for:  persistant nausea and vomiting   Complete by: As directed    Call MD for:  severe uncontrolled pain   Complete by: As directed    Call MD for:  temperature >100.4   Complete by: As directed    Diet - low sodium heart healthy   Complete by: As directed    Discharge instructions   Complete by: As directed    Please take your medications as prescribed.  Please be sure to follow-up with your primary care provider within 1 week.  Cardiology will arrange outpatient follow-up.  Complete the course of your antibiotics.  You were cared for by a hospitalist during your hospital stay. If you have any questions about your discharge medications or the care you received while you were in the hospital after you are discharged, you can call the unit and asked to speak with the hospitalist on call if the hospitalist that took care of you is not available. Once you are discharged, your primary care physician will handle any further medical issues. Please note that NO REFILLS for any discharge medications will be authorized once you are discharged, as it is imperative that you return to your primary care physician (or establish a relationship with a primary care physician if you do not have one) for your aftercare needs so that they can reassess your need for medications and monitor your  lab values. If you do not have a primary care physician, you can call [phone] for a physician referral.   Increase activity slowly   Complete by: As directed           Allergies as of 10/01/2022       Reactions   Fish Allergy Swelling   Only fried fish   Prednisone Anxiety        Medication List     STOP taking these  medications    irbesartan 150 MG tablet Commonly known as: AVAPRO   meloxicam 15 MG tablet Commonly known as: MOBIC       TAKE these medications    acetaminophen 500 MG tablet Commonly known as: TYLENOL Take 1,000 mg by mouth every 8 (eight) hours as needed for moderate pain, fever or headache.   Aspirin Low Dose 81 MG tablet Generic drug: aspirin EC Take 1 tablet (81 mg total) by mouth daily. Swallow whole.   atorvastatin 80 MG tablet Commonly known as: LIPITOR Take 1 tablet (80 mg total) by mouth daily.   CENTRUM SILVER PO Take 1 tablet by mouth daily in the afternoon.   colchicine 0.6 MG tablet Take 1 tablet (0.6 mg total) by mouth 2 (two) times daily.   cyanocobalamin 1000 MCG tablet Commonly known as: VITAMIN B12 Take 1,000 mcg by mouth daily in the afternoon.   doxycycline 100 MG capsule Commonly known as: VIBRAMYCIN Take 1 capsule (100 mg total) by mouth 2 (two) times daily for 10 days.   levothyroxine 150 MCG tablet Commonly known as: SYNTHROID Take 150 mcg by mouth daily before breakfast.   metoprolol succinate 25 MG 24 hr tablet Commonly known as: TOPROL-XL Take 0.5 tablets (12.5 mg total) by mouth daily.   prasugrel 10 MG Tabs tablet Commonly known as: EFFIENT Take 1 tablet (10 mg total) by mouth daily.   sertraline 100 MG tablet Commonly known as: ZOLOFT Take 100 mg by mouth daily in the afternoon.          Follow-up Information     Dyann Kief, PA-C Follow up on 10/12/2022.   Specialty: Cardiology Why: Appointment at 8:45 AM this is one of Dr. Hoyle Barr PAs Contact information: [address] [place] [address] [phone]         [facility] Follow up.   Specialty: Cardiology Why: the office will call you for lipid clinic appt Contact information: [address], [address] [id number] [place]c [place] [address] [phone]        Chilton Greathouse, MD.  Schedule an appointment as soon as possible for a visit in 1 week(s).   Specialty: Internal Medicine Why: post hospitalization follow up Contact information: [address] [place] [address] [phone]                 TOTAL DISCHARGE TIME: 35 minutes  Keyauna Graefe Rito Ehrlich  [facility] Pager on [email or web address]  10/02/2022, 10:56 AM

## 2022-10-01 NOTE — Progress Notes (Addendum)
Rounding Note    Patient Name: Bryan Thompson Date of Encounter: 10/01/2022  [facility] Cardiologist: Lance Muss, MD   Subjective   Sitting up in chair, no chest pain,  no SOB, some lower rib pain -  no palpitations though freq PACs he is not aware of these.    Inpatient Medications    Scheduled Meds:  aspirin EC  81 mg Oral Daily   atorvastatin  80 mg Oral Daily   colchicine  0.6 mg Oral BID   doxycycline  100 mg Oral BID   enoxaparin (LOVENOX) injection  40 mg Subcutaneous Q24H   levothyroxine  150 mcg Oral Q0600   metoprolol succinate  12.5 mg Oral Daily   pantoprazole  40 mg Oral Q0600   prasugrel  10 mg Oral Daily   sertraline  100 mg Oral Daily   sodium chloride flush  3 mL Intravenous Q12H   sodium chloride flush  3 mL Intravenous Q12H   sodium chloride flush  3 mL Intravenous Q12H   Continuous Infusions:  sodium chloride     sodium chloride     PRN Meds: sodium chloride, sodium chloride, acetaminophen, morphine injection, nitroGLYCERIN, ondansetron (ZOFRAN) IV, mouth rinse, oxyCODONE, sodium chloride flush, sodium chloride flush   Vital Signs    Vitals:   09/30/22 0803 09/30/22 1652 09/30/22 2000 10/01/22 0500  BP: 125/68 136/66 127/73 121/76  Pulse:   81 69  Resp: 20 19 (!) 21 19  Temp: (!) 97.5 F (36.4 C) (!) 97.5 F (36.4 C) 98.4 F (36.9 C) 98.2 F (36.8 C)  TempSrc: Oral Oral Oral Oral  SpO2:   95% 95%  Weight:    100.2 kg  Height:        Intake/Output Summary (Last 24 hours) at 10/01/2022 0814 Last data filed at 10/01/2022 0507 Gross per 24 hour  Intake 240 ml  Output 280 ml  Net -40 ml      10/01/2022    5:00 AM 09/30/2022    3:32 AM 09/29/2022    1:30 PM  Last 3 Weights  Weight (lbs) 221 lb 223 lb 6.4 oz 222 lb  Weight (kg) 100.245 kg 101.334 kg 100.699 kg      Telemetry    SR with PACs, freq  - Personally Reviewed  ECG    No new - Personally Reviewed  Physical Exam   GEN: No acute distress.   Neck: No  JVD Cardiac: RRR, soft systolic murmur, no rubs, or gallops.  Respiratory: Clear to auscultation bilaterally. GI: Soft, nontender, non-distended  MS: No edema; No deformity. Neuro:  Nonfocal  Psych: Normal affect   Labs    High Sensitivity Troponin:   Recent Labs  Lab 09/29/22 0419 09/29/22 0534 09/29/22 1251 09/29/22 1655 09/30/22 0330  TROPONINIHS 7,851* 7,207* 5,790* 6,560* 6,538*     Chemistry Recent Labs  Lab 09/29/22 0419 09/29/22 1655 09/30/22 0332 10/01/22 0210  NA 127*  --  132*  --   K 4.0  --  4.0  --   CL 96*  --  99  --   CO2 22  --  22  --   GLUCOSE 160*  --  130*  --   BUN 13  --  13  --   CREATININE 1.21 1.21 1.27*  --   CALCIUM 8.4*  --  8.5*  --   MG  --   --   --  1.8  PROT 7.2  --   --   --  ALBUMIN 3.2*  --   --   --   AST 39  --   --   --   ALT 28  --   --   --   ALKPHOS 64  --   --   --   BILITOT 0.4  --   --   --   GFRNONAA >60 >60 60*  --   ANIONGAP 9  --  11  --     Lipids  Recent Labs  Lab 09/29/22 1251  CHOL 133  TRIG 122  HDL 29*  LDLCALC 80  CHOLHDL 4.6    Hematology Recent Labs  Lab 09/29/22 0419 09/29/22 1655 09/30/22 0332  WBC 13.3* 12.7* 11.4*  RBC 3.71* 3.44* 3.29*  HGB 11.7* 10.8* 10.4*  HCT 33.7* 32.2* 30.0*  MCV 90.8 93.6 91.2  MCH 31.5 31.4 31.6  MCHC 34.7 33.5 34.7  RDW 12.7 13.0 13.1  PLT 263 294 280   Thyroid  Recent Labs  Lab 09/29/22 1251  TSH 1.187    BNPNo results for input(s): "BNP", "PROBNP" in the last 168 hours.  DDimer No results for input(s): "DDIMER" in the last 168 hours.   Radiology    CARDIAC CATHETERIZATION  Result Date: 09/29/2022   Ramus lesion is 40% stenosed.   Prox RCA lesion is 30% stenosed.   Mid RCA lesion is 20% stenosed.   Mid Cx to Dist Cx lesion is 99% stenosed.   Prox LAD to Mid LAD lesion is 30% stenosed.   A drug-eluting stent was successfully placed.   Post intervention, there is a 0% residual stenosis. Acute coronary syndrome secondary to total 99% left  circumflex stenosis with significant thrombus burden. Mild concomitant CAD with 30% AT stenosis, 40% proximal ramus intermediate stenosis, and 30 and 20% mid RCA stenosis and a dominant RCA. LV EDP 28 mm Hg. Successful PCI to the left circumflex vessel with Pronto thrombectomy and ultimate insertion of a 2.5 x 20 mm Synergy DES stent postdilated to 2.71 mm with transient no flow and ultimate restoration of brisk TIMI-3 flow with the 99% stenosis being reduced to 0%. RECOMMENDATION: DAPT with aspirin/Brilinta for minimum of 12 months.  Continue Aggrastat for 18 hours post procedure.  Aggressive lipid-lowering therapy with trial of statin rechallenge.  If unable to take statin, initiate PCSK9 inhibition.  Medical therapy for concomitant CAD.   ECHOCARDIOGRAM COMPLETE  Result Date: 09/29/2022    ECHOCARDIOGRAM REPORT   Patient Name:   Bryan Thompson [facility] Date of Exam: 09/29/2022 Medical Rec #:  [id number]       Height:       70.0 in Accession #:    [id number]      Weight:       215.0 lb Date of Birth:  1950/12/23      BSA:          2.152 m Patient Age:    71 years        BP:           109/58 mmHg Patient Gender: M               HR:           75 bpm. Exam Location:  Inpatient Procedure: 2D Echo, Cardiac Doppler, Color Doppler and Intracardiac            Opacification Agent STAT ECHO Indications:    Elevated Troponin  History:        Patient has no prior history of Echocardiogram  examinations.                 Risk Factors:Hypertension and Dyslipidemia.  Sonographer:    Wallie Char Referring Phys: [id number] JENNIFER YATES  Sonographer Comments: Msgd Jearld Pies MD @ 12:33pm IMPRESSIONS  1. Left ventricular ejection fraction, by estimation, is 50 to 55%. The left ventricle has low normal function. The left ventricle demonstrates regional wall motion abnormalities (see scoring diagram/findings for description). Left ventricular diastolic  parameters are consistent with Grade I diastolic dysfunction (impaired relaxation). There is  severe hypokinesis of the left ventricular, entire inferolateral wall.  2. Right ventricular systolic function is normal. The right ventricular size is normal.  3. Left atrial size was mildly dilated.  4. The mitral valve is normal in structure. No evidence of mitral valve regurgitation. No evidence of mitral stenosis.  5. The aortic valve is tricuspid. There is mild calcification of the aortic valve. Aortic valve regurgitation is not visualized. No aortic stenosis is present. Aortic valve area, by VTI measures 1.80 cm. Aortic valve mean gradient measures 6.3 mmHg. Aortic valve Vmax measures 1.69 m/s.  6. The inferior vena cava is normal in size with greater than 50% respiratory variability, suggesting right atrial pressure of 3 mmHg. FINDINGS  Left Ventricle: Left ventricular ejection fraction, by estimation, is 50 to 55%. The left ventricle has low normal function. The left ventricle demonstrates regional wall motion abnormalities. Severe hypokinesis of the left ventricular, entire inferolateral wall. Definity contrast agent was given IV to delineate the left ventricular endocardial borders. The left ventricular internal cavity size was normal in size. There is no left ventricular hypertrophy. Left ventricular diastolic parameters are consistent with Grade I diastolic dysfunction (impaired relaxation). Right Ventricle: The right ventricular size is normal. No increase in right ventricular wall thickness. Right ventricular systolic function is normal. Left Atrium: Left atrial size was mildly dilated. Right Atrium: Right atrial size was normal in size. Pericardium: There is no evidence of pericardial effusion. Mitral Valve: The mitral valve is normal in structure. No evidence of mitral valve regurgitation. No evidence of mitral valve stenosis. MV peak gradient, 5.8 mmHg. The mean mitral valve gradient is 2.0 mmHg. Tricuspid Valve: The tricuspid valve is normal in structure. Tricuspid valve regurgitation is trivial.  No evidence of tricuspid stenosis. Aortic Valve: The aortic valve is tricuspid. There is mild calcification of the aortic valve. Aortic valve regurgitation is not visualized. No aortic stenosis is present. Aortic valve mean gradient measures 6.3 mmHg. Aortic valve peak gradient measures 11.5 mmHg. Aortic valve area, by VTI measures 1.80 cm. Pulmonic Valve: The pulmonic valve was normal in structure. Pulmonic valve regurgitation is trivial. No evidence of pulmonic stenosis. Aorta: The aortic root is normal in size and structure. Venous: The inferior vena cava is normal in size with greater than 50% respiratory variability, suggesting right atrial pressure of 3 mmHg. IAS/Shunts: No atrial level shunt detected by color flow Doppler.  LEFT VENTRICLE PLAX 2D LVIDd:         4.30 cm      Diastology LVIDs:         2.90 cm      LV e' medial:    6.03 cm/s LV PW:         1.00 cm      LV E/e' medial:  18.9 LV IVS:        0.70 cm      LV e' lateral:   4.64 cm/s LVOT diam:     1.80 cm  LV E/e' lateral: 24.6 LV SV:         62 LV SV Index:   29 LVOT Area:     2.54 cm  LV Volumes (MOD) LV vol d, MOD A2C: 53.4 ml LV vol d, MOD A4C: 110.0 ml LV vol s, MOD A2C: 23.8 ml LV vol s, MOD A4C: 37.8 ml LV SV MOD A2C:     29.6 ml LV SV MOD A4C:     110.0 ml LV SV MOD BP:      48.8 ml RIGHT VENTRICLE             IVC RV Basal diam:  3.00 cm     IVC diam: 2.00 cm RV S prime:     13.50 cm/s TAPSE (M-mode): 1.9 cm LEFT ATRIUM             Index        RIGHT ATRIUM           Index LA diam:        3.50 cm 1.63 cm/m   RA Area:     11.40 cm LA Vol (A2C):   40.8 ml 18.95 ml/m  RA Volume:   25.00 ml  11.61 ml/m LA Vol (A4C):   42.6 ml 19.79 ml/m LA Biplane Vol: 42.5 ml 19.74 ml/m  AORTIC VALVE AV Area (Vmax):    1.69 cm AV Area (Vmean):   1.78 cm AV Area (VTI):     1.80 cm AV Vmax:           169.33 cm/s AV Vmean:          118.667 cm/s AV VTI:            0.343 m AV Peak Grad:      11.5 mmHg AV Mean Grad:      6.3 mmHg LVOT Vmax:          112.50 cm/s LVOT Vmean:        83.100 cm/s LVOT VTI:          0.242 m LVOT/AV VTI ratio: 0.71  AORTA Ao Root diam: 3.10 cm Ao Asc diam:  3.50 cm MITRAL VALVE                TRICUSPID VALVE MV Area (PHT): 4.04 cm     TR Peak grad:   33.6 mmHg MV Area VTI:   1.85 cm     TR Vmax:        290.00 cm/s MV Peak grad:  5.8 mmHg MV Mean grad:  2.0 mmHg     SHUNTS MV Vmax:       1.20 m/s     Systemic VTI:  0.24 m MV Vmean:      72.2 cm/s    Systemic Diam: 1.80 cm MV Decel Time: 188 msec MV E velocity: 114.00 cm/s MV A velocity: 122.00 cm/s MV E/A ratio:  0.93 Arvilla Meres MD Electronically signed by Arvilla Meres MD Signature Date/Time: 09/29/2022/12:49:50 PM    Final     Cardiac Studies   Cardiac cath 09/29/2022     Ramus lesion is 40% stenosed.   Prox RCA lesion is 30% stenosed.   Mid RCA lesion is 20% stenosed.   Mid Cx to Dist Cx lesion is 99% stenosed.   Prox LAD to Mid LAD lesion is 30% stenosed.   A drug-eluting stent was successfully placed.   Post intervention, there is a 0% residual stenosis.   Acute coronary syndrome secondary to total  99% left circumflex stenosis with significant thrombus burden.   Mild concomitant CAD with 30% AT stenosis, 40% proximal ramus intermediate stenosis, and 30 and 20% mid RCA stenosis and a dominant RCA.   LV EDP 28 mm Hg.   Successful PCI to the left circumflex vessel with Pronto thrombectomy and ultimate insertion of a 2.5 x 20 mm Synergy DES stent postdilated to 2.71 mm with transient no flow and ultimate restoration of brisk TIMI-3 flow with the 99% stenosis being reduced to 0%.   RECOMMENDATION: DAPT with aspirin/Brilinta for minimum of 12 months.  Continue Aggrastat for 18 hours post procedure.  Aggressive lipid-lowering therapy with trial of statin rechallenge.  If unable to take statin, initiate PCSK9 inhibition.  Medical therapy for concomitant CAD.    Diagnostic Dominance: Right  Intervention      Patient Profile     72 y.o. male with a  hx of HTN, HLD with prior statin intolerance, MGUS followed at cancer center, depression, probable CKD stage 2, hypothyroidism presented with NSTEMI.   Assessment & Plan    CAD/NSTEMI/ stent to LCX:  DAPT, due to expense changed Brilinta to Effient with 60 mg given last pm and now on 10 mg daily.    SVT added BB with improved HR - no palpitations.   --freq PACs  Pericarditis  --colchicine for 3 months no chest pain.   --no ibuprofen since no pain.         For questions or updates, please contact [facility] Please consult [email or web address] for contact info under        Signed, Nada Boozer, NP  10/01/2022, 8:14 AM    I have examined the patient and reviewed assessment and plan and discussed with patient.  Agree with above as stated.    Doing well today.  Pericarditis type chest pain has resolved.  Continue colchicine for now.  Will hold off on ibuprofen to minimize bleeding risk.  Continue dual antiplatelet therapy with aspirin and Effient.  Lipid-lowering therapy will be important for him going forward.  He will need to see the lipid clinic as he does not want to use statins.  Right radial site stable.  SVT symptoms that were apparent yesterday seem better controlled on low-dose beta-blocker today.  Okay for discharge from a cardiac standpoint.    Lance Muss

## 2022-10-04 ENCOUNTER — Telehealth: Payer: Self-pay | Admitting: Physician Assistant

## 2022-10-04 NOTE — Progress Notes (Signed)
Cardiology Office Note:    Date:  10/12/2022   ID:  Bryan Thompson, DOB 11-Feb-1951, MRN [id number]  PCP:  Chilton Greathouse, MD   [facility] Cardiologist:  Lance Muss, MD     Referring MD: Chilton Greathouse, MD   Chief Complaint:  Hospitalization Follow-up     History of Present Illness:   Bryan Thompson is a 72 y.o. male with a hx of HTN, HLD with prior statin intolerance, MGUS followed at cancer center, depression, probable CKD stage 2, hypothyroidism     Patient admitted with NSTEMI 09/29/22 treated with DES Cfx with significant thrombus burden with residual nonobstructive CAD. Couldn't afford brilinta so changed to effient. He also had SVT improved with BB and pericarditis treated with colchicine for 3 months.  Patient comes in with his wife. He doesn't feel well. Feels like he has the flu. Dull headache, diarrhea 2-3 times/day. Weak, washed out. Denies chest pain, palpitations, edema. Occasional DOE. Labs at PCP yest Plt high 665. Agreed to try lipitor and see if he tolerates-hasn't tolerated simvastatin in past due to body aches. Many questions answered.      Past Medical History:  Diagnosis Date   COVID-19    Depression    Hernia, umbilical    Hyperlipidemia    Hypertension    Thyroid disease    Current Medications: Current Meds  Medication Sig   acetaminophen (TYLENOL) 500 MG tablet Take 1,000 mg by mouth every 8 (eight) hours as needed for moderate pain, fever or headache.   aspirin EC 81 MG tablet Take 1 tablet (81 mg total) by mouth daily. Swallow whole.   atorvastatin (LIPITOR) 80 MG tablet Take 1 tablet (80 mg total) by mouth daily.   colchicine 0.6 MG tablet Take 1 tablet (0.6 mg total) by mouth daily.   levothyroxine (SYNTHROID, LEVOTHROID) 150 MCG tablet Take 150 mcg by mouth daily before breakfast.   metoprolol succinate (TOPROL-XL) 25 MG 24 hr tablet Take 0.5 tablets (12.5 mg total) by mouth daily.   Multiple Vitamins-Minerals  (CENTRUM SILVER PO) Take 1 tablet by mouth daily in the afternoon.   nitroGLYCERIN (NITROSTAT) 0.4 MG SL tablet Place 1 tablet (0.4 mg total) under the tongue every 5 (five) minutes as needed for chest pain.   prasugrel (EFFIENT) 10 MG TABS tablet Take 1 tablet (10 mg total) by mouth daily.   sertraline (ZOLOFT) 100 MG tablet Take 100 mg by mouth daily in the afternoon.   vitamin B-12 (CYANOCOBALAMIN) 1000 MCG tablet Take 1,000 mcg by mouth daily in the afternoon.   [DISCONTINUED] colchicine 0.6 MG tablet Take 1 tablet (0.6 mg total) by mouth 2 (two) times daily.    Allergies:   Fish allergy and Prednisone   Social History   Tobacco Use   Smoking status: Never   Smokeless tobacco: Never  Vaping Use   Vaping Use: Never used  Substance Use Topics   Alcohol use: Not Currently   Drug use: Never    Family Hx: The patient's family history includes Cancer in his mother; Colon polyps in his father; Heart disease in his mother; Hypertension in his father. There is no history of Colon cancer, Esophageal cancer, Rectal cancer, or Stomach cancer.  ROS     Physical Exam:    VS:  BP 116/70   Pulse 77   Ht 5\' 10"  (1.778 m)   SpO2 97%   BMI 31.71 kg/m     Wt Readings from Last 3 Encounters:  10/01/22  221 lb (100.2 kg)  09/27/22 215 lb (97.5 kg)  07/20/22 224 lb 6.4 oz (101.8 kg)    Physical Exam  GEN: Well nourished, well developed, in no acute distress  Neck: no JVD, carotid bruits, or masses Cardiac:RRR;1/6 systolic murmur Respiratory:  clear to auscultation bilaterally, normal work of breathing GI: soft, nontender, nondistended, + BS Ext: without cyanosis, clubbing, or edema, Good distal pulses bilaterally Neuro:  Alert and Oriented x 3,  Psych: euthymic mood, full affect        EKGs/Labs/Other Test Reviewed:    EKG:  EKG is  not ordered today.     Recent Labs: 09/29/2022: ALT 28; TSH 1.187 09/30/2022: BUN 13; Creatinine, Ser 1.27; Hemoglobin 10.4; Platelets 280; Potassium  4.0; Sodium 132 10/01/2022: Magnesium 1.8   Recent Lipid Panel Recent Labs    09/29/22 1251  CHOL 133  TRIG 122  HDL 29*  VLDL 24  LDLCALC 80     Prior CV Studies:    Cardiac cath 09/29/2022     Ramus lesion is 40% stenosed.   Prox RCA lesion is 30% stenosed.   Mid RCA lesion is 20% stenosed.   Mid Cx to Dist Cx lesion is 99% stenosed.   Prox LAD to Mid LAD lesion is 30% stenosed.   A drug-eluting stent was successfully placed.   Post intervention, there is a 0% residual stenosis.   Acute coronary syndrome secondary to total 99% left circumflex stenosis with significant thrombus burden.   Mild concomitant CAD with 30% AT stenosis, 40% proximal ramus intermediate stenosis, and 30 and 20% mid RCA stenosis and a dominant RCA.   LV EDP 28 mm Hg.   Successful PCI to the left circumflex vessel with Pronto thrombectomy and ultimate insertion of a 2.5 x 20 mm Synergy DES stent postdilated to 2.71 mm with transient no flow and ultimate restoration of brisk TIMI-3 flow with the 99% stenosis being reduced to 0%.   RECOMMENDATION: DAPT with aspirin/Brilinta for minimum of 12 months.  Continue Aggrastat for 18 hours post procedure.  Aggressive lipid-lowering therapy with trial of statin rechallenge.  If unable to take statin, initiate PCSK9 inhibition.  Medical therapy for concomitant CAD.     Diagnostic Dominance: Right  Intervention        Risk Assessment/Calculations/Metrics:              ASSESSMENT & PLAN:   No problem-specific Assessment & Plan notes found for this encounter.   CAD/NSTEMI/ stent to LCX:  DAPT, due to expense changed Brilinta to Effient with 60 mg given last pm and now on 10 mg daily. Wife thinks brilinta caused SVT. Platelets at PCP yesterday 665. Will reach out to Dr. Eldridge Dace to see if he wants to switch him to another drug. NTG prn. Note for dentist he can have cleaning.   SVT added BB with improved HR - no palpitations.   --freq PACs    Pericarditis  --colchicine for 3 months, no chest pain if he can tolerate. Try to decrease colchicine to 0.6 mg once daily with diarrhea. May have to stop. --no ibuprofen since no pain.    HLD-didn't tolerate simvastatin in past. PCP talked him in to trying lipitor. Keep f/u with lipid clinic in 10 days.            Cardiac Rehabilitation Eligibility Assessment            Dispo:  No follow-ups on file.   Medication Adjustments/Labs and Tests Ordered: Current medicines  are reviewed at length with the patient today.  Concerns regarding medicines are outlined above.  Tests Ordered: No orders of the defined types were placed in this encounter.  Medication Changes: Meds ordered this encounter  Medications   colchicine 0.6 MG tablet    Sig: Take 1 tablet (0.6 mg total) by mouth daily.    Dispense:  90 tablet    Refill:  3   nitroGLYCERIN (NITROSTAT) 0.4 MG SL tablet    Sig: Place 1 tablet (0.4 mg total) under the tongue every 5 (five) minutes as needed for chest pain.    Dispense:  25 tablet    Refill:  3   Signed, Jacolyn Reedy, PA-C  10/12/2022 9:47 AM    [facility] [address] [place], [place], [place]  [address] Phone: [phone]; Fax: [phone]

## 2022-10-11 DIAGNOSIS — I214 Non-ST elevation (NSTEMI) myocardial infarction: Secondary | ICD-10-CM | POA: Diagnosis not present

## 2022-10-11 DIAGNOSIS — D126 Benign neoplasm of colon, unspecified: Secondary | ICD-10-CM | POA: Diagnosis not present

## 2022-10-11 DIAGNOSIS — I129 Hypertensive chronic kidney disease with stage 1 through stage 4 chronic kidney disease, or unspecified chronic kidney disease: Secondary | ICD-10-CM | POA: Diagnosis not present

## 2022-10-11 DIAGNOSIS — D649 Anemia, unspecified: Secondary | ICD-10-CM | POA: Diagnosis not present

## 2022-10-11 DIAGNOSIS — E785 Hyperlipidemia, unspecified: Secondary | ICD-10-CM | POA: Diagnosis not present

## 2022-10-11 DIAGNOSIS — L299 Pruritus, unspecified: Secondary | ICD-10-CM | POA: Diagnosis not present

## 2022-10-11 DIAGNOSIS — R7301 Impaired fasting glucose: Secondary | ICD-10-CM | POA: Diagnosis not present

## 2022-10-11 DIAGNOSIS — N1831 Chronic kidney disease, stage 3a: Secondary | ICD-10-CM | POA: Diagnosis not present

## 2022-10-11 DIAGNOSIS — R0683 Snoring: Secondary | ICD-10-CM | POA: Diagnosis not present

## 2022-10-11 DIAGNOSIS — M1990 Unspecified osteoarthritis, unspecified site: Secondary | ICD-10-CM | POA: Diagnosis not present

## 2022-10-11 DIAGNOSIS — D472 Monoclonal gammopathy: Secondary | ICD-10-CM | POA: Diagnosis not present

## 2022-10-12 ENCOUNTER — Encounter: Payer: Self-pay | Admitting: Physician Assistant

## 2022-10-12 ENCOUNTER — Ambulatory Visit: Payer: Medicare HMO | Attending: Physician Assistant | Admitting: Physician Assistant

## 2022-10-12 VITALS — BP 116/70 | HR 77 | Ht 70.0 in

## 2022-10-12 DIAGNOSIS — I471 Supraventricular tachycardia, unspecified: Secondary | ICD-10-CM | POA: Diagnosis not present

## 2022-10-12 DIAGNOSIS — I238 Other current complications following acute myocardial infarction: Secondary | ICD-10-CM

## 2022-10-12 DIAGNOSIS — I2510 Atherosclerotic heart disease of native coronary artery without angina pectoris: Secondary | ICD-10-CM | POA: Diagnosis not present

## 2022-10-12 DIAGNOSIS — I319 Disease of pericardium, unspecified: Secondary | ICD-10-CM

## 2022-10-12 DIAGNOSIS — E7849 Other hyperlipidemia: Secondary | ICD-10-CM

## 2022-10-12 MED ORDER — NITROGLYCERIN 0.4 MG SL SUBL: 0.4000 mg | SUBLINGUAL_TABLET | SUBLINGUAL | 3 refills | Status: DC | PRN

## 2022-10-12 MED ORDER — COLCHICINE 0.6 MG PO TABS: 0.6000 mg | ORAL_TABLET | Freq: Every day | ORAL | 3 refills | Status: DC

## 2022-10-12 NOTE — Patient Instructions (Signed)
Medication Instructions:  Your physician has recommended you make the following change in your medication:   Decrease colchicine to once daily   *If you need a refill on your cardiac medications before your next appointment, please call your pharmacy*   Follow-Up: At [facility], you and your health needs are our priority.  As part of our continuing mission to provide you with exceptional heart care, we have created designated Provider Care Teams.  These Care Teams include your primary Cardiologist (physician) and Advanced Practice Providers (APPs -  Physician Assistants and Nurse Practitioners) who all work together to provide you with the care you need, when you need it.  Your next appointment:   2 month(s)  Provider:   Lance Muss, MD

## 2022-10-13 ENCOUNTER — Telehealth: Payer: Self-pay | Admitting: Physician Assistant

## 2022-10-13 NOTE — Telephone Encounter (Signed)
Spoke with patient and informed him of message from Jacolyn Reedy, PA-C following up on yesterday's visit:  Please let patient know Dr. Eldridge Dace wants to continue Effient. No changes, thanks    Patient verbalized understanding and expressed appreciation for call.

## 2022-10-13 NOTE — Telephone Encounter (Signed)
-----   Message from Dyann Kief, PA-C sent at 10/13/2022 11:00 AM EDT ----- Please let patient know Dr. Eldridge Dace wants to continue Effient. No changes, thanks ----- Message ----- From: Corky Crafts, MD Sent: 10/12/2022   3:18 PM EDT To: Dyann Kief, PA-C  Can continue Effient.  ----- Message ----- From: Collier Bullock Sent: 10/12/2022   9:47 AM EDT To: Corky Crafts, MD  This patient had blood work at PCP yesterday and Platelets were up 665-normal in hospital while on brilinta but switched to effient due to cost(wife says it caused SVT). Do you want to keep him on effient or switch him?

## 2022-10-22 ENCOUNTER — Other Ambulatory Visit (HOSPITAL_COMMUNITY): Payer: Self-pay

## 2022-10-22 ENCOUNTER — Ambulatory Visit: Payer: Medicare HMO | Attending: Internal Medicine | Admitting: Student

## 2022-10-22 ENCOUNTER — Telehealth: Payer: Self-pay | Admitting: Pharmacist

## 2022-10-22 ENCOUNTER — Telehealth: Payer: Self-pay

## 2022-10-22 ENCOUNTER — Telehealth (HOSPITAL_COMMUNITY): Payer: Self-pay

## 2022-10-22 ENCOUNTER — Encounter: Payer: Self-pay | Admitting: Student

## 2022-10-22 DIAGNOSIS — R7989 Other specified abnormal findings of blood chemistry: Secondary | ICD-10-CM | POA: Diagnosis not present

## 2022-10-22 DIAGNOSIS — E785 Hyperlipidemia, unspecified: Secondary | ICD-10-CM | POA: Diagnosis not present

## 2022-10-22 NOTE — Telephone Encounter (Signed)
Called and spoke with pt in regards to CR, pt stated he is not interested at this time due to having medications issues and not feeling well. Once he gets his medications figured out he will contact [facility] for scheduling.   Closed referral

## 2022-10-22 NOTE — Progress Notes (Signed)
Patient ID: Bryan Thompson                 DOB: 1950-11-19                    MRN: [id number]      HPI: Bryan Thompson is a 72 y.o. male patient referred to lipid clinic by Dr.Varanasi. PMH is significant for CAD,NSTEMI 09/29/2022 s/p Stent LCX.HTN,pericarditis  HLD with prior statin intolerance, MGUS followed at cancer center, depression, probable CKD stage 2, hypothyroidism    Patient admitted with NSTEMI 09/29/22 treated with DES Cfx with significant thrombus burden with residual nonobstructive CAD. Couldn't afford brilinta so changed to effient. He also had SVT improved with BB and pericarditis treated with colchicine for 3 months. Patient saw Jacolyn Reedy, PA on 10/12/2022. Colchicine dose was reduced from 0.6 mg twice daily to once day. Patient presented today with his wife. Reports he feels terrible from past couple weeks. His wife is former Engineer, civil (consulting) and has been checking his heart rate and rhythm every days multiple times per day and noticing it is not normal. He has dull headache, flu like body aches ( upper back, shoulder), tiredness, dizziness, occasional SOB even while sitting. Wife thinks Effient is causing all these side effects. Will reach out to Dr.Varanasi regarding antiplatelet  side effects ( may consider clopidogrel) He tried Lipitor for 4 days and he started experiencing muscle cramps so he had stopped taking it. Reviewed options for lowering LDL cholesterol, including ezetimibe, PCSK-9 inhibitors, bempedoic acid and inclisiran.  Discussed mechanisms of action, dosing, side effects and potential decreases in LDL cholesterol.  Also reviewed cost information and potential options for patient assistance.  Current Medications: none Intolerances: simvastatin, Lipitor 40 daily- muscle cramps, joint pain overall weakness  Risk Factors: CAD,NSTEMI 09/29/2022 s/p Stent LCX.HTN,CKD stage 2  LDL goal: <55 mg/dl   Diet: heart healthy diet   Exercise: 10 min walks twice daily   Family  History: The patient's family history includes Cancer in his mother; Colon polyps in his father; Heart disease in his mother; Hypertension in his father.  Social History:  Alcohol: none   Labs: Lipid Panel     Component Value Date/Time   CHOL 133 09/29/2022 1251   TRIG 122 09/29/2022 1251   HDL 29 (L) 09/29/2022 1251   CHOLHDL 4.6 09/29/2022 1251   VLDL 24 09/29/2022 1251   LDLCALC 80 09/29/2022 1251    Past Medical History:  Diagnosis Date   COVID-19    Depression    Hernia, umbilical    Hyperlipidemia    Hypertension    Thyroid disease     Current Outpatient Medications on File Prior to Visit  Medication Sig Dispense Refill   acetaminophen (TYLENOL) 500 MG tablet Take 1,000 mg by mouth every 8 (eight) hours as needed for moderate pain, fever or headache.     aspirin EC 81 MG tablet Take 1 tablet (81 mg total) by mouth daily. Swallow whole. 30 tablet 12   colchicine 0.6 MG tablet Take 1 tablet (0.6 mg total) by mouth daily. 90 tablet 3   levothyroxine (SYNTHROID, LEVOTHROID) 150 MCG tablet Take 150 mcg by mouth daily before breakfast.     metoprolol succinate (TOPROL-XL) 25 MG 24 hr tablet Take 0.5 tablets (12.5 mg total) by mouth daily. 30 tablet 2   Multiple Vitamins-Minerals (CENTRUM SILVER PO) Take 1 tablet by mouth daily in the afternoon.     nitroGLYCERIN (NITROSTAT) 0.4 MG SL tablet  Place 1 tablet (0.4 mg total) under the tongue every 5 (five) minutes as needed for chest pain. 25 tablet 3   prasugrel (EFFIENT) 10 MG TABS tablet Take 1 tablet (10 mg total) by mouth daily. 30 tablet 2   sertraline (ZOLOFT) 100 MG tablet Take 100 mg by mouth daily in the afternoon.     vitamin B-12 (CYANOCOBALAMIN) 1000 MCG tablet Take 1,000 mcg by mouth daily in the afternoon.     No current facility-administered medications on file prior to visit.    Allergies  Allergen Reactions   Fish Allergy Swelling    Only fried fish   Prednisone Anxiety    Assessment/Plan:  1.  Hyperlipidemia -  Problem  Dyslipidemia   Dyslipidemia Assessment:  LDL goal: <55 mg/dl last LDLc 80 mg/dl ( 16/02/9603)  Patient has stopped taking Lipitor  Intolerance to statins - myalgia and joint pain  Discussed next potential options (ezetimibe, PCSK-9 inhibitors, bempedoic acid and inclisiran); cost, dosing efficacy, side effects    Plan: Will apply for PA for PCSK9i; will inform patient upon approval  Lipid lab due in 2-3 months after starting PCSK9i    Thank you,  Carmela Hurt, Pharm.D [facility] of [facility] [address] [place], [place] [address]  Phone: [phone]; Fax: [phone]

## 2022-10-22 NOTE — Patient Instructions (Signed)
Your Results:             Your most recent labs Goal  Total Cholesterol 133 < 200  Triglycerides 122 < 150  HDL (happy/good cholesterol) 29 > 40  LDL (lousy/bad cholesterol 80 < 55   Medication changes: We will start the process to get PCSK9i (Repatha or Praluent) covered by your insurance.  Once the prior authorization is complete, we will call you to let you know and confirm pharmacy information.     Praluent is a cholesterol medication that improved your body's ability to get rid of "bad cholesterol" known as LDL. It can lower your LDL up to 60%. It is an injection that is given under the skin every 2 weeks. The most common side effects of Praluent include runny nose, symptoms of the common cold, rarely flu or flu-like symptoms, back/muscle pain in about 3-4% of the patients, and redness, pain, or bruising at the injection site.    Repatha is a cholesterol medication that improved your body's ability to get rid of "bad cholesterol" known as LDL. It can lower your LDL up to 60%! It is an injection that is given under the skin every 2 weeks. The medication often requires a prior authorization from your insurance company. We will take care of submitting all the necessary information to your insurance company to get it approved. The most common side effects of Repatha include runny nose, symptoms of the common cold, rarely flu or flu-like symptoms, back/muscle pain in about 3-4% of the patients, and redness, pain, or bruising at the injection site.   Lab orders: We want to repeat labs after 2-3 months.  We will send you a lab order to remind you once we get closer to that time.

## 2022-10-22 NOTE — Telephone Encounter (Signed)
Pharmacy Patient Advocate Encounter  Prior Authorization for REPATHA has been APPROVED by [facility] [employer] [employer] from 1.1.24 to 12.31.24.   KEY # [id number]

## 2022-10-22 NOTE — Assessment & Plan Note (Addendum)
Assessment:  LDL goal: <55 mg/dl last LDLc 80 mg/dl ( 16/02/9603)  Patient has stopped taking Lipitor  Intolerance to statins - myalgia and joint pain  Discussed next potential options (ezetimibe, PCSK-9 inhibitors, bempedoic acid and inclisiran); cost, dosing efficacy, side effects    Plan: Will apply for PA for PCSK9i; will inform patient upon approval  Lipid lab due in 2-3 months after starting [facility]

## 2022-10-26 MED ORDER — REPATHA SURECLICK 140 MG/ML ~~LOC~~ SOAJ: 140.0000 mg | SUBCUTANEOUS | 3 refills | Status: DC

## 2022-10-26 NOTE — Telephone Encounter (Signed)
Patient made aware, see other encounter

## 2022-10-26 NOTE — Telephone Encounter (Signed)
PA approved.

## 2022-10-28 ENCOUNTER — Other Ambulatory Visit (HOSPITAL_BASED_OUTPATIENT_CLINIC_OR_DEPARTMENT_OTHER): Payer: Self-pay

## 2022-10-29 ENCOUNTER — Other Ambulatory Visit (HOSPITAL_BASED_OUTPATIENT_CLINIC_OR_DEPARTMENT_OTHER): Payer: Self-pay

## 2022-10-29 DIAGNOSIS — T466X5A Adverse effect of antihyperlipidemic and antiarteriosclerotic drugs, initial encounter: Secondary | ICD-10-CM | POA: Insufficient documentation

## 2022-11-02 ENCOUNTER — Telehealth: Payer: Self-pay | Admitting: Interventional Cardiology

## 2022-11-02 DIAGNOSIS — H1131 Conjunctival hemorrhage, right eye: Secondary | ICD-10-CM | POA: Diagnosis not present

## 2022-11-02 MED ORDER — COLCHICINE 0.6 MG PO TABS: 0.6000 mg | ORAL_TABLET | Freq: Every day | ORAL | 0 refills | Status: DC

## 2022-11-02 NOTE — Telephone Encounter (Signed)
Reviewed with Jacolyn Reedy, PA and if patient not having diarrhea from colchicine he should continue for 2 more months. I spoke with patient's wife and gave her this information.  She reports patient is not having any diarrhea.  He will resume colchicine for 2 months. Prescription sent to [facility] on [facility] [address].  Patient has appointment with eye doctor this afternoon.

## 2022-11-02 NOTE — Telephone Encounter (Signed)
Pt c/o medication issue:  1. Name of Medication:   aspirin EC 81 MG tablet    metoprolol succinate (TOPROL-XL) 25 MG 24 hr tablet   prasugrel (EFFIENT) 10 MG TABS tablet   2. How are you currently taking this medication (dosage and times per day)? Take 1 tablet (81 mg total) by mouth daily. Swallow whole.    Take 0.5 tablets (12.5 mg total) by mouth daily.   Take 1 tablet (10 mg total) by mouth daily.   3. Are you having a reaction (difficulty breathing--STAT)? No  4. What is your medication issue? Pt's wife called stating pt's inner side of his right eye is blood red and they have concerns. Please advise

## 2022-11-02 NOTE — Telephone Encounter (Signed)
I spoke with patient's wife who reports patient has blood in right eye from pupil to inner area near nose.  Reports as appearing very bloody.  First noticed during the night.  Did have a little redness in eye last week also.  Wife reports even before starting blood thinners patient would bleed easily.  Patient does have an eye doctor. I advised wife to contact eye doctor's office and explain what is going on with patient and schedule appointment for patient to be evaluated today.  If unable to see eye doctor today I advised her to schedule appointment with PCP for today or go to Urgent Care today.  Importance of continuing ASA and Effient explained to patient's wife.  I advised her to not make any changes in cardiac medications on their own.  I let her know if provider evaluating patient's eye felt medication changes were needed they could contact our office and speak with doctor in the office.  Wife reports patient stopped colchicine yesterday as prescription had run out.  She states they were told in the hospital if patient was not having chest pain he could stop colchicine after a month.  He is not having any chest pain.

## 2022-11-03 DIAGNOSIS — E039 Hypothyroidism, unspecified: Secondary | ICD-10-CM | POA: Diagnosis not present

## 2022-11-03 DIAGNOSIS — E785 Hyperlipidemia, unspecified: Secondary | ICD-10-CM | POA: Diagnosis not present

## 2022-11-03 DIAGNOSIS — I10 Essential (primary) hypertension: Secondary | ICD-10-CM | POA: Diagnosis not present

## 2022-11-03 DIAGNOSIS — D649 Anemia, unspecified: Secondary | ICD-10-CM | POA: Diagnosis not present

## 2022-11-04 DIAGNOSIS — H524 Presbyopia: Secondary | ICD-10-CM | POA: Diagnosis not present

## 2022-11-05 ENCOUNTER — Other Ambulatory Visit (HOSPITAL_BASED_OUTPATIENT_CLINIC_OR_DEPARTMENT_OTHER): Payer: Self-pay

## 2022-11-15 ENCOUNTER — Telehealth (HOSPITAL_COMMUNITY): Payer: Self-pay

## 2022-11-15 NOTE — Telephone Encounter (Signed)
Reviewed with patient the Cardiac Rehab Cardiac Risk [facility] Nursing Assessment. Patient knows office location, and to wear comfortable clothing/closed-toed shoes. Recommended to patient to eat, take medications, and if diabetic, to check blood sugar before coming to classes. Explained orientation may take 2 hours.  

## 2022-11-15 NOTE — Telephone Encounter (Signed)
Pt called and stated he is now interested in CR, pt will come in for orientation on 11/16/22 @ 1:15PM and will attend the 1:45PM class.  Mailed letter

## 2022-11-16 ENCOUNTER — Encounter (HOSPITAL_COMMUNITY)
Admission: RE | Admit: 2022-11-16 | Discharge: 2022-11-16 | Disposition: A | Discharge status: Home / Self Care | Payer: Medicare HMO | Source: Ambulatory Visit | Attending: Interventional Cardiology | Admitting: Interventional Cardiology

## 2022-11-16 VITALS — BP 106/72 | HR 68 | Ht 70.25 in | Wt 208.3 lb

## 2022-11-16 DIAGNOSIS — Z955 Presence of coronary angioplasty implant and graft: Secondary | ICD-10-CM | POA: Insufficient documentation

## 2022-11-16 DIAGNOSIS — I214 Non-ST elevation (NSTEMI) myocardial infarction: Secondary | ICD-10-CM | POA: Diagnosis not present

## 2022-11-16 NOTE — Progress Notes (Signed)
Cardiac Individual Treatment Plan  Patient Details  Name: Bryan Thompson MRN: [id number] Date of Birth: [birth year] Referring Provider:   Flowsheet Row INTENSIVE CARDIAC REHAB ORIENT from 11/16/2022 in [facility]  Referring Provider Corky Crafts, MD       Initial Encounter Date:  Flowsheet Row INTENSIVE CARDIAC REHAB ORIENT from 11/16/2022 in [facility]  Date 11/16/22       Visit Diagnosis: 09/29/22 NSTEMI (non-ST elevated myocardial infarction) (HCC)  09/29/22 DES Cfx  Patient's Home Medications on Admission:  Current Outpatient Medications:    acetaminophen (TYLENOL) 500 MG tablet, Take 1,000 mg by mouth every 8 (eight) hours as needed for moderate pain, fever or headache., Disp: , Rfl:    aspirin EC 81 MG tablet, Take 1 tablet (81 mg total) by mouth daily. Swallow whole., Disp: 30 tablet, Rfl: 12   colchicine 0.6 MG tablet, Take 1 tablet (0.6 mg total) by mouth daily. For 2 months and then stop, Disp: 60 tablet, Rfl: 0   levothyroxine (SYNTHROID, LEVOTHROID) 150 MCG tablet, Take 150 mcg by mouth daily before breakfast. Taking name brand, Disp: , Rfl:    metoprolol succinate (TOPROL-XL) 25 MG 24 hr tablet, Take 0.5 tablets (12.5 mg total) by mouth daily., Disp: 30 tablet, Rfl: 2   Multiple Vitamins-Minerals (CENTRUM SILVER PO), Take 1 tablet by mouth daily in the afternoon., Disp: , Rfl:    nitroGLYCERIN (NITROSTAT) 0.4 MG SL tablet, Place 1 tablet (0.4 mg total) under the tongue every 5 (five) minutes as needed for chest pain., Disp: 25 tablet, Rfl: 3   prasugrel (EFFIENT) 10 MG TABS tablet, Take 1 tablet (10 mg total) by mouth daily., Disp: 30 tablet, Rfl: 2   sertraline (ZOLOFT) 100 MG tablet, Take 100 mg by mouth daily in the afternoon., Disp: , Rfl:    vitamin B-12 (CYANOCOBALAMIN) 1000 MCG tablet, Take 1,000 mcg by mouth daily in the afternoon., Disp: ,  Rfl:    Evolocumab (REPATHA SURECLICK) 140 MG/ML SOAJ, Inject 140 mg into the skin every 14 (fourteen) days. (Patient not taking: Reported on 11/16/2022), Disp: 6 mL, Rfl: 3  Past Medical History: Past Medical History:  Diagnosis Date   COVID-19    Depression    Hernia, umbilical    Hyperlipidemia    Hypertension    Thyroid disease     Tobacco Use: Social History   Tobacco Use  Smoking Status Never  Smokeless Tobacco Never    Labs: Review Flowsheet       Latest Ref Rng & Units 09/29/2022  Labs for ITP Cardiac and Pulmonary Rehab  Cholestrol 0 - 200 mg/dL 664   LDL (calc) 0 - 99 mg/dL 80   HDL-C >40 mg/dL 29   Trlycerides <347 mg/dL 425   Hemoglobin Z5G 4.8 - 5.6 % 6.3     Capillary Blood Glucose: No results found for: "GLUCAP"   Exercise Target Goals: Exercise Program Goal: Individual exercise prescription set using results from initial 6 min walk test and THRR while considering  patient's activity barriers and safety.   Exercise Prescription Goal: Initial exercise prescription builds to 30-45 minutes a day of aerobic activity, 2-3 days per week.  Home exercise guidelines will be given to patient during program as part of exercise prescription that the participant will acknowledge.  Activity Barriers & Risk Stratification:  Activity Barriers & Cardiac Risk Stratification - 11/16/22 1340  Activity Barriers & Cardiac Risk Stratification   Activity Barriers None    Cardiac Risk Stratification Moderate             6 Minute Walk:  6 Minute Walk     Row Name 11/16/22 1352         6 Minute Walk   Phase Initial     Distance 1238 feet     Walk Time 6 minutes     # of Rest Breaks 0     MPH 2.34     METS 2.28     RPE 11     Perceived Dyspnea  0     VO2 Peak 7.99     Symptoms No     Resting HR 68 bpm     Resting BP 106/72     Resting Oxygen Saturation  97 %     Exercise Oxygen Saturation  during 6 min walk 98 %     Max Ex. HR 80 bpm     Max Ex.  BP 120/72     2 Minute Post BP 120/70              Oxygen Initial Assessment:   Oxygen Re-Evaluation:   Oxygen Discharge (Final Oxygen Re-Evaluation):   Initial Exercise Prescription:  Initial Exercise Prescription - 11/16/22 1500       Date of Initial Exercise RX and Referring Provider   Date 11/16/22    Referring Provider Corky Crafts, MD    Expected Discharge Date 01/28/23      Recumbant Bike   Level 1    Minutes 15    METs 2.3      NuStep   Level 2    SPM 85    Minutes 15    METs 2.3      Prescription Details   Frequency (times per week) 3    Duration Progress to 30 minutes of continuous aerobic without signs/symptoms of physical distress      Intensity   THRR 40-80% of Max Heartrate 59-118    Ratings of Perceived Exertion 11-13    Perceived Dyspnea 0-4      Progression   Progression Continue to progress workloads to maintain intensity without signs/symptoms of physical distress.      Resistance Training   Training Prescription Yes    Weight 3 lbs    Reps 10-15             Perform Capillary Blood Glucose checks as needed.  Exercise Prescription Changes:   Exercise Comments:   Exercise Goals and Review:   Exercise Goals     Row Name 11/16/22 1418             Exercise Goals   Increase Physical Activity Yes       Intervention Provide advice, education, support and counseling about physical activity/exercise needs.;Develop an individualized exercise prescription for aerobic and resistive training based on initial evaluation findings, risk stratification, comorbidities and participant's personal goals.       Expected Outcomes Short Term: Attend rehab on a regular basis to increase amount of physical activity.;Long Term: Exercising regularly at least 3-5 days a week.;Long Term: Add in home exercise to make exercise part of routine and to increase amount of physical activity.       Increase Strength and Stamina Yes        Intervention Provide advice, education, support and counseling about physical activity/exercise needs.;Develop an individualized exercise prescription for aerobic and resistive training  based on initial evaluation findings, risk stratification, comorbidities and participant's personal goals.       Expected Outcomes Short Term: Increase workloads from initial exercise prescription for resistance, speed, and METs.;Short Term: Perform resistance training exercises routinely during rehab and add in resistance training at home;Long Term: Improve cardiorespiratory fitness, muscular endurance and strength as measured by increased METs and functional capacity ( )       Able to understand and use rate of perceived exertion (RPE) scale Yes       Intervention Provide education and explanation on how to use RPE scale       Expected Outcomes Short Term: Able to use RPE daily in rehab to express subjective intensity level;Long Term:  Able to use RPE to guide intensity level when exercising independently       Knowledge and understanding of Target Heart Rate Range (THRR) Yes       Intervention Provide education and explanation of THRR including how the numbers were predicted and where they are located for reference       Expected Outcomes Short Term: Able to state/look up THRR;Short Term: Able to use daily as guideline for intensity in rehab;Long Term: Able to use THRR to govern intensity when exercising independently       Able to check pulse independently Yes       Intervention Provide education and demonstration on how to check pulse in carotid and radial arteries.;Review the importance of being able to check your own pulse for safety during independent exercise       Expected Outcomes Short Term: Able to explain why pulse checking is important during independent exercise;Long Term: Able to check pulse independently and accurately       Understanding of Exercise Prescription Yes       Intervention Provide  education, explanation, and written materials on patient's individual exercise prescription       Expected Outcomes Short Term: Able to explain program exercise prescription;Long Term: Able to explain home exercise prescription to exercise independently                Exercise Goals Re-Evaluation :   Discharge Exercise Prescription (Final Exercise Prescription Changes):   Nutrition:  Target Goals: Understanding of nutrition guidelines, daily intake of sodium 1500mg , cholesterol 200mg , calories 30% from fat and 7% or less from saturated fats, daily to have 5 or more servings of fruits and vegetables.  Biometrics:  Pre Biometrics - 11/16/22 1300       Pre Biometrics   Waist Circumference 43.5 inches    Hip Circumference 42 inches    Waist to Hip Ratio 1.04 %    Triceps Skinfold 22 mm    % Body Fat 31.4 %    Grip Strength 30 kg    Flexibility 0 in    Single Leg Stand 9.68 seconds              Nutrition Therapy Plan and Nutrition Goals:   Nutrition Assessments:  MEDIFICTS Score Key: ?70 Need to make dietary changes  40-70 Heart Healthy Diet ? 40 Therapeutic Level Cholesterol Diet    Picture Your Plate Scores: <56 Unhealthy dietary pattern with much room for improvement. 41-50 Dietary pattern unlikely to meet recommendations for good health and room for improvement. 51-60 More healthful dietary pattern, with some room for improvement.  >60 Healthy dietary pattern, although there may be some specific behaviors that could be improved.    Nutrition Goals Re-Evaluation:   Nutrition Goals Re-Evaluation:  Nutrition Goals Discharge (Final Nutrition Goals Re-Evaluation):   Psychosocial: Target Goals: Acknowledge presence or absence of significant depression and/or stress, maximize coping skills, provide positive support system. Participant is able to verbalize types and ability to use techniques and skills needed for reducing stress and depression.  Initial  Review & Psychosocial Screening:  Initial Psych Review & Screening - 11/16/22 1420       Initial Review   Current issues with Current Depression;Current Stress Concerns    Source of Stress Concerns Chronic Illness    Comments Bryan Thompson admits to being depressed due to his recent hospitalization.      Family Dynamics   Good Support System? Yes   Bryan Thompson has his wife, son and friends for support     Barriers   Psychosocial barriers to participate in program The patient should benefit from training in stress management and relaxation.      Screening Interventions   Interventions Encouraged to exercise;Provide feedback about the scores to participant    Expected Outcomes Long Term Goal: Stressors or current issues are controlled or eliminated.;Short Term goal: Identification and review with participant of any Quality of Life or Depression concerns found by scoring the questionnaire.             Quality of Life Scores:  Quality of Life - 11/16/22 1521       Quality of Life   Select Quality of Life      Quality of Life Scores   Health/Function Pre 22.86 %    Socioeconomic Pre 30 %    Psych/Spiritual Pre 25.07 %    Family Pre 28.8 %    GLOBAL Pre 25.74 %            Scores of 19 and below usually indicate a poorer quality of life in these areas.  A difference of  2-3 points is a clinically meaningful difference.  A difference of 2-3 points in the total score of the Quality of Life Index has been associated with significant improvement in overall quality of life, self-image, physical symptoms, and general health in studies assessing change in quality of life.  PHQ-9: Review Flowsheet       11/16/2022  Depression screen PHQ 2/9  Decreased Interest 0  Down, Depressed, Hopeless 1  PHQ - 2 Score 1  Altered sleeping 0  Tired, decreased energy 1  Change in appetite 0  Feeling bad or failure about yourself  1  Trouble concentrating 0  Moving slowly or fidgety/restless 1  Suicidal  thoughts 0  PHQ-9 Score 4  Difficult doing work/chores Somewhat difficult   Interpretation of Total Score  Total Score Depression Severity:  1-4 = Minimal depression, 5-9 = Mild depression, 10-14 = Moderate depression, 15-19 = Moderately severe depression, 20-27 = Severe depression   Psychosocial Evaluation and Intervention:   Psychosocial Re-Evaluation:   Psychosocial Discharge (Final Psychosocial Re-Evaluation):   Vocational Rehabilitation: Provide vocational rehab assistance to qualifying candidates.   Vocational Rehab Evaluation & Intervention:  Vocational Rehab - 11/16/22 1423       Initial Vocational Rehab Evaluation & Intervention   Assessment shows need for Vocational Rehabilitation No   Bryan Thompson is retired and does not need vocational rehab at this time            Education: Education Goals: Education classes will be provided on a weekly basis, covering required topics. Participant will state understanding/return demonstration of topics presented.     Core Videos: Exercise    Move It!  Clinical staff conducted group or individual video education with verbal and written material and guidebook.  Patient learns the recommended Pritikin exercise program. Exercise with the goal of living a long, healthy life. Some of the health benefits of exercise include controlled diabetes, healthier blood pressure levels, improved cholesterol levels, improved heart and lung capacity, improved sleep, and better body composition. Everyone should speak with their doctor before starting or changing an exercise routine.  Biomechanical Limitations Clinical staff conducted group or individual video education with verbal and written material and guidebook.  Patient learns how biomechanical limitations can impact exercise and how we can mitigate and possibly overcome limitations to have an impactful and balanced exercise routine.  Body Composition Clinical staff conducted group or  individual video education with verbal and written material and guidebook.  Patient learns that body composition (ratio of muscle mass to fat mass) is a key component to assessing overall fitness, rather than body weight alone. Increased fat mass, especially visceral belly fat, can put [place] at increased risk for metabolic syndrome, type 2 diabetes, heart disease, and even death. It is recommended to combine diet and exercise (cardiovascular and resistance training) to improve your body composition. Seek guidance from your physician and exercise physiologist before implementing an exercise routine.  Exercise Action Plan Clinical staff conducted group or individual video education with verbal and written material and guidebook.  Patient learns the recommended strategies to achieve and enjoy long-term exercise adherence, including variety, self-motivation, self-efficacy, and positive decision making. Benefits of exercise include fitness, good health, weight management, more energy, better sleep, less stress, and overall well-being.  Medical   Heart Disease Risk Reduction Clinical staff conducted group or individual video education with verbal and written material and guidebook.  Patient learns our heart is our most vital organ as it circulates oxygen, nutrients, white blood cells, and hormones throughout the entire body, and carries waste away. Data supports a plant-based eating plan like the Pritikin Program for its effectiveness in slowing progression of and reversing heart disease. The video provides a number of recommendations to address heart disease.   Metabolic Syndrome and Belly Fat  Clinical staff conducted group or individual video education with verbal and written material and guidebook.  Patient learns what metabolic syndrome is, how it leads to heart disease, and how one can reverse it and keep it from coming back. You have metabolic syndrome if you have 3 of the following 5 criteria: abdominal  obesity, high blood pressure, high triglycerides, low HDL cholesterol, and high blood sugar.  Hypertension and Heart Disease Clinical staff conducted group or individual video education with verbal and written material and guidebook.  Patient learns that high blood pressure, or hypertension, is very common in the [place]. Hypertension is largely due to excessive salt intake, but other important risk factors include being overweight, physical inactivity, drinking too much alcohol, smoking, and not eating enough potassium from fruits and vegetables. High blood pressure is a leading risk factor for heart attack, stroke, congestive heart failure, dementia, kidney failure, and premature death. Long-term effects of excessive salt intake include stiffening of the arteries and thickening of heart muscle and organ damage. Recommendations include ways to reduce hypertension and the risk of heart disease.  Diseases of Our Time - Focusing on Diabetes Clinical staff conducted group or individual video education with verbal and written material and guidebook.  Patient learns why the best way to stop diseases of our time is prevention, through food and other lifestyle changes. Medicine (  such as prescription pills and surgeries) is often only a Band-Aid on the problem, not a long-term solution. Most common diseases of our time include obesity, type 2 diabetes, hypertension, heart disease, and cancer. The Pritikin Program is recommended and has been proven to help reduce, reverse, and/or prevent the damaging effects of metabolic syndrome.  Nutrition   Overview of the Pritikin Eating Plan  Clinical staff conducted group or individual video education with verbal and written material and guidebook.  Patient learns about the Pritikin Eating Plan for disease risk reduction. The Pritikin Eating Plan emphasizes a wide variety of unrefined, minimally-processed carbohydrates, like fruits, vegetables, whole grains, and  legumes. Go, Caution, and Stop food choices are explained. Plant-based and lean animal proteins are emphasized. Rationale provided for low sodium intake for blood pressure control, low added sugars for blood sugar stabilization, and low added fats and oils for coronary artery disease risk reduction and weight management.  Calorie Density  Clinical staff conducted group or individual video education with verbal and written material and guidebook.  Patient learns about calorie density and how it impacts the Pritikin Eating Plan. Knowing the characteristics of the food you choose will help you decide whether those foods will lead to weight gain or weight loss, and whether you want to consume more or less of them. Weight loss is usually a side effect of the Pritikin Eating Plan because of its focus on low calorie-dense foods.  Label Reading  Clinical staff conducted group or individual video education with verbal and written material and guidebook.  Patient learns about the Pritikin recommended label reading guidelines and corresponding recommendations regarding calorie density, added sugars, sodium content, and whole grains.  Dining Out - Part 1  Clinical staff conducted group or individual video education with verbal and written material and guidebook.  Patient learns that restaurant meals can be sabotaging because they can be so high in calories, fat, sodium, and/or sugar. Patient learns recommended strategies on how to positively address this and avoid unhealthy pitfalls.  Facts on Fats  Clinical staff conducted group or individual video education with verbal and written material and guidebook.  Patient learns that lifestyle modifications can be just as effective, if not more so, as many medications for lowering your risk of heart disease. A Pritikin lifestyle can help to reduce your risk of inflammation and atherosclerosis (cholesterol build-up, or plaque, in the artery walls). Lifestyle  interventions such as dietary choices and physical activity address the cause of atherosclerosis. A review of the types of fats and their impact on blood cholesterol levels, along with dietary recommendations to reduce fat intake is also included.  Nutrition Action Plan  Clinical staff conducted group or individual video education with verbal and written material and guidebook.  Patient learns how to incorporate Pritikin recommendations into their lifestyle. Recommendations include planning and keeping personal health goals in mind as an important part of their success.  Healthy Mind-Set    Healthy Minds, Bodies, Hearts  Clinical staff conducted group or individual video education with verbal and written material and guidebook.  Patient learns how to identify when they are stressed. Video will discuss the impact of that stress, as well as the many benefits of stress management. Patient will also be introduced to stress management techniques. The way we think, act, and feel has an impact on our hearts.  How Our Thoughts Can Heal Our Hearts  Clinical staff conducted group or individual video education with verbal and written material and guidebook.  Patient learns that negative thoughts can cause depression and anxiety. This can result in negative lifestyle behavior and serious health problems. Cognitive behavioral therapy is an effective method to help control our thoughts in order to change and improve our emotional outlook.  Additional Videos:  Exercise    Improving Performance  Clinical staff conducted group or individual video education with verbal and written material and guidebook.  Patient learns to use a non-linear approach by alternating intensity levels and lengths of time spent exercising to help burn more calories and lose more body fat. Cardiovascular exercise helps improve heart health, metabolism, hormonal balance, blood sugar control, and recovery from fatigue. Resistance training  improves strength, endurance, balance, coordination, reaction time, metabolism, and muscle mass. Flexibility exercise improves circulation, posture, and balance. Seek guidance from your physician and exercise physiologist before implementing an exercise routine and learn your capabilities and proper form for all exercise.  Introduction to Yoga  Clinical staff conducted group or individual video education with verbal and written material and guidebook.  Patient learns about yoga, a discipline of the coming together of mind, breath, and body. The benefits of yoga include improved flexibility, improved range of motion, better posture and core strength, increased lung function, weight loss, and positive self-image. Yoga's heart health benefits include lowered blood pressure, healthier heart rate, decreased cholesterol and triglyceride levels, improved immune function, and reduced stress. Seek guidance from your physician and exercise physiologist before implementing an exercise routine and learn your capabilities and proper form for all exercise.  Medical   Aging: Enhancing Your Quality of Life  Clinical staff conducted group or individual video education with verbal and written material and guidebook.  Patient learns key strategies and recommendations to stay in good physical health and enhance quality of life, such as prevention strategies, having an advocate, securing a Health Care Proxy and Power of Attorney, and keeping a list of medications and system for tracking them. It also discusses how to avoid risk for bone loss.  Biology of Weight Control  Clinical staff conducted group or individual video education with verbal and written material and guidebook.  Patient learns that weight gain occurs because we consume more calories than we burn (eating more, moving less). Even if your body weight is normal, you may have higher ratios of fat compared to muscle mass. Too much body fat puts you at increased  risk for cardiovascular disease, heart attack, stroke, type 2 diabetes, and obesity-related cancers. In addition to exercise, following the Pritikin Eating Plan can help reduce your risk.  Decoding Lab Results  Clinical staff conducted group or individual video education with verbal and written material and guidebook.  Patient learns that lab test reflects one measurement whose values change over time and are influenced by many factors, including medication, stress, sleep, exercise, food, hydration, pre-existing medical conditions, and more. It is recommended to use the knowledge from this video to become more involved with your lab results and evaluate your numbers to speak with your doctor.   Diseases of Our Time - Overview  Clinical staff conducted group or individual video education with verbal and written material and guidebook.  Patient learns that according to the CDC, 50% to 70% of chronic diseases (such as obesity, type 2 diabetes, elevated lipids, hypertension, and heart disease) are avoidable through lifestyle improvements including healthier food choices, listening to satiety cues, and increased physical activity.  Sleep Disorders Clinical staff conducted group or individual video education with verbal and written material and guidebook.  Patient learns how good quality and duration of sleep are important to overall health and well-being. Patient also learns about sleep disorders and how they impact health along with recommendations to address them, including discussing with a physician.  Nutrition  Dining Out - Part 2 Clinical staff conducted group or individual video education with verbal and written material and guidebook.  Patient learns how to plan ahead and communicate in order to maximize their dining experience in a healthy and nutritious manner. Included are recommended food choices based on the type of restaurant the patient is visiting.   Fueling a Tax inspector conducted group or individual video education with verbal and written material and guidebook.  There is a strong connection between our food choices and our health. Diseases like obesity and type 2 diabetes are very prevalent and are in large-part due to lifestyle choices. The Pritikin Eating Plan provides plenty of food and hunger-curbing satisfaction. It is easy to follow, affordable, and helps reduce health risks.  Menu Workshop  Clinical staff conducted group or individual video education with verbal and written material and guidebook.  Patient learns that restaurant meals can sabotage health goals because they are often packed with calories, fat, sodium, and sugar. Recommendations include strategies to plan ahead and to communicate with the manager, chef, or server to help order a healthier meal.  Planning Your Eating Strategy  Clinical staff conducted group or individual video education with verbal and written material and guidebook.  Patient learns about the Pritikin Eating Plan and its benefit of reducing the risk of disease. The Pritikin Eating Plan does not focus on calories. Instead, it emphasizes high-quality, nutrient-rich foods. By knowing the characteristics of the foods, we choose, we can determine their calorie density and make informed decisions.  Targeting Your Nutrition Priorities  Clinical staff conducted group or individual video education with verbal and written material and guidebook.  Patient learns that lifestyle habits have a tremendous impact on disease risk and progression. This video provides eating and physical activity recommendations based on your personal health goals, such as reducing LDL cholesterol, losing weight, preventing or controlling type 2 diabetes, and reducing high blood pressure.  Vitamins and Minerals  Clinical staff conducted group or individual video education with verbal and written material and guidebook.  Patient learns different ways to  obtain key vitamins and minerals, including through a recommended healthy diet. It is important to discuss all supplements you take with your doctor.   Healthy Mind-Set    Smoking Cessation  Clinical staff conducted group or individual video education with verbal and written material and guidebook.  Patient learns that cigarette smoking and tobacco addiction pose a serious health risk which affects millions of people. Stopping smoking will significantly reduce the risk of heart disease, lung disease, and many forms of cancer. Recommended strategies for quitting are covered, including working with your doctor to develop a successful plan.  Culinary   Becoming a Set designer conducted group or individual video education with verbal and written material and guidebook.  Patient learns that cooking at home can be healthy, cost-effective, quick, and puts them in control. Keys to cooking healthy recipes will include looking at your recipe, assessing your equipment needs, planning ahead, making it simple, choosing cost-effective seasonal ingredients, and limiting the use of added fats, salts, and sugars.  Cooking - Breakfast and Snacks  Clinical staff conducted group or individual video education with verbal and written material and guidebook.  Patient learns how important breakfast is to satiety and nutrition through the entire day. Recommendations include key foods to eat during breakfast to help stabilize blood sugar levels and to prevent overeating at meals later in the day. Planning ahead is also a key component.  Cooking - Educational psychologist conducted group or individual video education with verbal and written material and guidebook.  Patient learns eating strategies to improve overall health, including an approach to cook more at home. Recommendations include thinking of animal protein as a side on your plate rather than center stage and focusing instead on lower  calorie dense options like vegetables, fruits, whole grains, and plant-based proteins, such as beans. Making sauces in large quantities to freeze for later and leaving the skin on your vegetables are also recommended to maximize your experience.  Cooking - Healthy Salads and Dressing Clinical staff conducted group or individual video education with verbal and written material and guidebook.  Patient learns that vegetables, fruits, whole grains, and legumes are the foundations of the Pritikin Eating Plan. Recommendations include how to incorporate each of these in flavorful and healthy salads, and how to create homemade salad dressings. Proper handling of ingredients is also covered. Cooking - Soups and [employer] - Soups and Desserts Clinical staff conducted group or individual video education with verbal and written material and guidebook.  Patient learns that Pritikin soups and desserts make for easy, nutritious, and delicious snacks and meal components that are low in sodium, fat, sugar, and calorie density, while high in vitamins, minerals, and filling fiber. Recommendations include simple and healthy ideas for soups and desserts.   Overview     The Pritikin Solution Program Overview Clinical staff conducted group or individual video education with verbal and written material and guidebook.  Patient learns that the results of the Pritikin Program have been documented in more than 100 articles published in peer-reviewed journals, and the benefits include reducing risk factors for (and, in some cases, even reversing) high cholesterol, high blood pressure, type 2 diabetes, obesity, and more! An overview of the three key pillars of the Pritikin Program will be covered: eating well, doing regular exercise, and having a healthy mind-set.  WORKSHOPS  Exercise: Exercise Basics: Building Your Action Plan Clinical staff led group instruction and group discussion with PowerPoint presentation and  patient guidebook. To enhance the learning environment the use of posters, models and videos may be added. At the conclusion of this workshop, patients will comprehend the difference between physical activity and exercise, as well as the benefits of incorporating both, into their routine. Patients will understand the FITT (Frequency, Intensity, Time, and Type) principle and how to use it to build an exercise action plan. In addition, safety concerns and other considerations for exercise and cardiac rehab will be addressed by the presenter. The purpose of this lesson is to promote a comprehensive and effective weekly exercise routine in order to improve patients' overall level of fitness.   Managing Heart Disease: Your Path to a Healthier Heart Clinical staff led group instruction and group discussion with PowerPoint presentation and patient guidebook. To enhance the learning environment the use of posters, models and videos may be added.At the conclusion of this workshop, patients will understand the anatomy and physiology of the heart. Additionally, they will understand how Pritikin's three pillars impact the risk factors, the progression, and the management of heart disease.  The purpose of this lesson is to provide a high-level overview of the  heart, heart disease, and how the Pritikin lifestyle positively impacts risk factors.  Exercise Biomechanics Clinical staff led group instruction and group discussion with PowerPoint presentation and patient guidebook. To enhance the learning environment the use of posters, models and videos may be added. Patients will learn how the structural parts of their bodies function and how these functions impact their daily activities, movement, and exercise. Patients will learn how to promote a neutral spine, learn how to manage pain, and identify ways to improve their physical movement in order to promote healthy living. The purpose of this lesson is to expose  patients to common physical limitations that impact physical activity. Participants will learn practical ways to adapt and manage aches and pains, and to minimize their effect on regular exercise. Patients will learn how to maintain good posture while sitting, walking, and lifting.  Balance Training and Fall Prevention  Clinical staff led group instruction and group discussion with PowerPoint presentation and patient guidebook. To enhance the learning environment the use of posters, models and videos may be added. At the conclusion of this workshop, patients will understand the importance of their sensorimotor skills (vision, proprioception, and the vestibular system) in maintaining their ability to balance as they age. Patients will apply a variety of balancing exercises that are appropriate for their current level of function. Patients will understand the common causes for poor balance, possible solutions to these problems, and ways to modify their physical environment in order to minimize their fall risk. The purpose of this lesson is to teach patients about the importance of maintaining balance as they age and ways to minimize their risk of falling.  WORKSHOPS   Nutrition:  Fueling a Ship broker led group instruction and group discussion with PowerPoint presentation and patient guidebook. To enhance the learning environment the use of posters, models and videos may be added. Patients will review the foundational principles of the Pritikin Eating Plan and understand what constitutes a serving size in each of the food groups. Patients will also learn Pritikin-friendly foods that are better choices when away from home and review make-ahead meal and snack options. Calorie density will be reviewed and applied to three nutrition priorities: weight maintenance, weight loss, and weight gain. The purpose of this lesson is to reinforce (in a group setting) the key concepts around what  patients are recommended to eat and how to apply these guidelines when away from home by planning and selecting Pritikin-friendly options. Patients will understand how calorie density may be adjusted for different weight management goals.  Mindful Eating  Clinical staff led group instruction and group discussion with PowerPoint presentation and patient guidebook. To enhance the learning environment the use of posters, models and videos may be added. Patients will briefly review the concepts of the Pritikin Eating Plan and the importance of low-calorie dense foods. The concept of mindful eating will be introduced as well as the importance of paying attention to internal hunger signals. Triggers for non-hunger eating and techniques for dealing with triggers will be explored. The purpose of this lesson is to provide patients with the opportunity to review the basic principles of the Pritikin Eating Plan, discuss the value of eating mindfully and how to measure internal cues of hunger and fullness using the Hunger Scale. Patients will also discuss reasons for non-hunger eating and learn strategies to use for controlling emotional eating.  Targeting Your Nutrition Priorities Clinical staff led group instruction and group discussion with PowerPoint presentation and patient guidebook. To  enhance the learning environment the use of posters, models and videos may be added. Patients will learn how to determine their genetic susceptibility to disease by reviewing their family history. Patients will gain insight into the importance of diet as part of an overall healthy lifestyle in mitigating the impact of genetics and other environmental insults. The purpose of this lesson is to provide patients with the opportunity to assess their personal nutrition priorities by looking at their family history, their own health history and current risk factors. Patients will also be able to discuss ways of prioritizing and modifying  the Pritikin Eating Plan for their highest risk areas  Menu  Clinical staff led group instruction and group discussion with PowerPoint presentation and patient guidebook. To enhance the learning environment the use of posters, models and videos may be added. Using menus brought in from [employer], or printed from [employer], patients will apply the Pritikin dining out guidelines that were presented in the [employer] video. Patients will also be able to practice these guidelines in a variety of provided scenarios. The purpose of this lesson is to provide patients with the opportunity to practice hands-on learning of the Pritikin Dining Out guidelines with actual menus and practice scenarios.  Label Reading Clinical staff led group instruction and group discussion with PowerPoint presentation and patient guidebook. To enhance the learning environment the use of posters, models and videos may be added. Patients will review and discuss the Pritikin label reading guidelines presented in Pritikin's Label Reading Educational series video. Using fool labels brought in from local grocery stores and markets, patients will apply the label reading guidelines and determine if the packaged food meet the Pritikin guidelines. The purpose of this lesson is to provide patients with the opportunity to review, discuss, and practice hands-on learning of the Pritikin Label Reading guidelines with actual packaged food labels. Cooking School  Pritikin's [employer] are designed to teach patients ways to prepare quick, simple, and affordable recipes at home. The importance of nutrition's role in chronic disease risk reduction is reflected in its emphasis in the overall Pritikin program. By learning how to prepare essential core Pritikin Eating Plan recipes, patients will increase control over what they eat; be able to customize the flavor of foods without the use of added salt, sugar, or  fat; and improve the quality of the food they consume. By learning a set of core recipes which are easily assembled, quickly prepared, and affordable, patients are more likely to prepare more healthy foods at home. These workshops focus on convenient breakfasts, simple entres, side dishes, and desserts which can be prepared with minimal effort and are consistent with nutrition recommendations for cardiovascular risk reduction. Cooking [employer] are taught by a Armed forces logistics/support/administrative officer (RD) who has been trained by the [employer]. The chef or RD has a clear understanding of the importance of minimizing - if not completely eliminating - added fat, sugar, and sodium in recipes. Throughout the series of Cooking School Workshop sessions, patients will learn about healthy ingredients and efficient methods of cooking to build confidence in their capability to prepare    Cooking School weekly topics:  Adding Flavor- Sodium-Free  Fast and Healthy Breakfasts  Powerhouse Plant-Based Proteins  Satisfying Salads and Dressings  Simple Sides and Sauces  International Cuisine-Spotlight on the [employer] Zones  Delicious Desserts  Savory Soups  [employer] - Meals in a Snap  Tasty Appetizers and Snacks  Comforting Weekend  Breakfasts  One-Pot Wonders   Fast Evening Meals  Landscape architect Your Pritikin Plate  WORKSHOPS   Healthy Mindset (Psychosocial):  Focused Goals, Sustainable Changes Clinical staff led group instruction and group discussion with PowerPoint presentation and patient guidebook. To enhance the learning environment the use of posters, models and videos may be added. Patients will be able to apply effective goal setting strategies to establish at least one personal goal, and then take consistent, meaningful action toward that goal. They will learn to identify common barriers to achieving personal goals and develop strategies to overcome them. Patients  will also gain an understanding of how our mind-set can impact our ability to achieve goals and the importance of cultivating a positive and growth-oriented mind-set. The purpose of this lesson is to provide patients with a deeper understanding of how to set and achieve personal goals, as well as the tools and strategies needed to overcome common obstacles which may arise along the way.  From Head to Heart: The Power of a Healthy Outlook  Clinical staff led group instruction and group discussion with PowerPoint presentation and patient guidebook. To enhance the learning environment the use of posters, models and videos may be added. Patients will be able to recognize and describe the impact of emotions and mood on physical health. They will discover the importance of self-care and explore self-care practices which may work for them. Patients will also learn how to utilize the 4 C's to cultivate a healthier outlook and better manage stress and challenges. The purpose of this lesson is to demonstrate to patients how a healthy outlook is an essential part of maintaining good health, especially as they continue their cardiac rehab journey.  Healthy Sleep for a Healthy Heart Clinical staff led group instruction and group discussion with PowerPoint presentation and patient guidebook. To enhance the learning environment the use of posters, models and videos may be added. At the conclusion of this workshop, patients will be able to demonstrate knowledge of the importance of sleep to overall health, well-being, and quality of life. They will understand the symptoms of, and treatments for, common sleep disorders. Patients will also be able to identify daytime and nighttime behaviors which impact sleep, and they will be able to apply these tools to help manage sleep-related challenges. The purpose of this lesson is to provide patients with a general overview of sleep and outline the importance of quality sleep. Patients  will learn about a few of the most common sleep disorders. Patients will also be introduced to the concept of "sleep hygiene," and discover ways to self-manage certain sleeping problems through simple daily behavior changes. Finally, the workshop will motivate patients by clarifying the links between quality sleep and their goals of heart-healthy living.   Recognizing and Reducing Stress Clinical staff led group instruction and group discussion with PowerPoint presentation and patient guidebook. To enhance the learning environment the use of posters, models and videos may be added. At the conclusion of this workshop, patients will be able to understand the types of stress reactions, differentiate between acute and chronic stress, and recognize the impact that chronic stress has on their health. They will also be able to apply different coping mechanisms, such as reframing negative self-talk. Patients will have the opportunity to practice a variety of stress management techniques, such as deep abdominal breathing, progressive muscle relaxation, and/or guided imagery.  The purpose of this lesson is to educate patients on the role of stress in their lives and  to provide healthy techniques for coping with it.  Learning Barriers/Preferences:  Learning Barriers/Preferences - 11/16/22 1432       Learning Barriers/Preferences   Learning Barriers None    Learning Preferences None             Education Topics:  Knowledge Questionnaire Score:  Knowledge Questionnaire Score - 11/16/22 1522       Knowledge Questionnaire Score   Pre Score 19/24             Core Components/Risk Factors/Patient Goals at Admission:  Personal Goals and Risk Factors at Admission - 11/16/22 1437       Core Components/Risk Factors/Patient Goals on Admission   Hypertension Yes    Intervention Provide education on lifestyle modifcations including regular physical activity/exercise, weight management, moderate sodium  restriction and increased consumption of fresh fruit, vegetables, and low fat dairy, alcohol moderation, and smoking cessation.;Monitor prescription use compliance.    Expected Outcomes Short Term: Continued assessment and intervention until BP is < 140/63mm HG in hypertensive participants. < 130/9mm HG in hypertensive participants with diabetes, heart failure or chronic kidney disease.;Long Term: Maintenance of blood pressure at goal levels.    Lipids Yes    Intervention Provide education and support for participant on nutrition & aerobic/resistive exercise along with prescribed medications to achieve LDL 70mg , HDL >40mg .    Expected Outcomes Short Term: Participant states understanding of desired cholesterol values and is compliant with medications prescribed. Participant is following exercise prescription and nutrition guidelines.;Long Term: Cholesterol controlled with medications as prescribed, with individualized exercise RX and with personalized nutrition plan. Value goals: LDL < 70mg , HDL > 40 mg.             Core Components/Risk Factors/Patient Goals Review:    Core Components/Risk Factors/Patient Goals at Discharge (Final Review):    ITP Comments:  ITP Comments     Row Name 11/16/22 1300           ITP Comments Medical Director- Dr. Armanda Magic, MD. Introduction to Pritikin Education Program/ Intensive Cardiac Rehab. Reviewed orientation packet.                Comments: Bryan Thompson attended orientation for the cardiac rehabilitation program on  11/16/2022  to perform initial intake and exercise walk test. Bryan Thompson was introduced to the [employer] education and orientation packet was reviewed. Completed 6-minute walk test, measurements, initial ITP, and exercise prescription. Vital signs stable. Telemetry-normal sinus rhythm with T-wave inversion, will verify lead placement at next session, asymptomatic.   Service time was from 1300 to 1455  Artist Pais, MS,  ACSM CEP .

## 2022-11-16 NOTE — Progress Notes (Addendum)
Cardiac Rehab Medication Review   Does the patient  feel that his/her medications are working for him/her?  yes  Has the patient been experiencing any side effects to the medications prescribed?  Patient was experiencing rash from [facility] but is no longer taking.  Does the patient measure his/her own blood pressure or blood glucose at home?  Patient's wife checks his blood pressure daily. Patient is not diabetic  Does the patient have any problems obtaining medications due to transportation or finances?   no  Understanding of regimen: good Understanding of indications: good Potential of compliance: good    Comments: Patient taking medications as prescribed.    Cristy Hilts 11/16/2022 3:33 PM

## 2022-11-22 ENCOUNTER — Other Ambulatory Visit (HOSPITAL_BASED_OUTPATIENT_CLINIC_OR_DEPARTMENT_OTHER): Payer: Self-pay

## 2022-11-22 ENCOUNTER — Other Ambulatory Visit (HOSPITAL_COMMUNITY): Payer: Self-pay

## 2022-11-22 ENCOUNTER — Encounter (HOSPITAL_COMMUNITY)
Admission: RE | Admit: 2022-11-22 | Discharge: 2022-11-22 | Disposition: A | Discharge status: Home / Self Care | Payer: Medicare HMO | Source: Ambulatory Visit | Attending: Interventional Cardiology | Admitting: Interventional Cardiology

## 2022-11-22 DIAGNOSIS — I252 Old myocardial infarction: Secondary | ICD-10-CM | POA: Insufficient documentation

## 2022-11-22 DIAGNOSIS — Z48812 Encounter for surgical aftercare following surgery on the circulatory system: Secondary | ICD-10-CM | POA: Diagnosis not present

## 2022-11-22 DIAGNOSIS — Z955 Presence of coronary angioplasty implant and graft: Secondary | ICD-10-CM | POA: Insufficient documentation

## 2022-11-22 DIAGNOSIS — I214 Non-ST elevation (NSTEMI) myocardial infarction: Secondary | ICD-10-CM | POA: Diagnosis not present

## 2022-11-22 NOTE — Progress Notes (Signed)
Cardiac Individual Treatment Plan  Patient Details  Name: Bryan Thompson MRN: [id number] Date of Birth: [birth year] Referring Provider:   Flowsheet Row INTENSIVE CARDIAC REHAB ORIENT from 11/16/2022 in [facility]  Referring Provider Corky Crafts, MD       Initial Encounter Date:  Flowsheet Row INTENSIVE CARDIAC REHAB ORIENT from 11/16/2022 in [facility]  Date 11/16/22       Visit Diagnosis: 09/29/22 NSTEMI (non-ST elevated myocardial infarction) (HCC)  09/29/22 DES Cfx  Patient's Home Medications on Admission:  Current Outpatient Medications:    acetaminophen (TYLENOL) 500 MG tablet, Take 1,000 mg by mouth every 8 (eight) hours as needed for moderate pain, fever or headache., Disp: , Rfl:    aspirin EC 81 MG tablet, Take 1 tablet (81 mg total) by mouth daily. Swallow whole., Disp: 30 tablet, Rfl: 12   colchicine 0.6 MG tablet, Take 1 tablet (0.6 mg total) by mouth daily. For 2 months and then stop, Disp: 60 tablet, Rfl: 0   Evolocumab (REPATHA SURECLICK) 140 MG/ML SOAJ, Inject 140 mg into the skin every 14 (fourteen) days. (Patient not taking: Reported on 11/16/2022), Disp: 6 mL, Rfl: 3   levothyroxine (SYNTHROID, LEVOTHROID) 150 MCG tablet, Take 150 mcg by mouth daily before breakfast. Taking name brand, Disp: , Rfl:    metoprolol succinate (TOPROL-XL) 25 MG 24 hr tablet, Take 0.5 tablets (12.5 mg total) by mouth daily., Disp: 30 tablet, Rfl: 2   Multiple Vitamins-Minerals (CENTRUM SILVER PO), Take 1 tablet by mouth daily in the afternoon., Disp: , Rfl:    nitroGLYCERIN (NITROSTAT) 0.4 MG SL tablet, Place 1 tablet (0.4 mg total) under the tongue every 5 (five) minutes as needed for chest pain., Disp: 25 tablet, Rfl: 3   prasugrel (EFFIENT) 10 MG TABS tablet, Take 1 tablet (10 mg total) by mouth daily., Disp: 30 tablet, Rfl: 2   sertraline (ZOLOFT) 100 MG tablet,  Take 100 mg by mouth daily in the afternoon., Disp: , Rfl:    vitamin B-12 (CYANOCOBALAMIN) 1000 MCG tablet, Take 1,000 mcg by mouth daily in the afternoon., Disp: , Rfl:   Past Medical History: Past Medical History:  Diagnosis Date   COVID-19    Depression    Hernia, umbilical    Hyperlipidemia    Hypertension    Thyroid disease     Tobacco Use: Social History   Tobacco Use  Smoking Status Never  Smokeless Tobacco Never    Labs: Review Flowsheet       Latest Ref Rng & Units 09/29/2022  Labs for ITP Cardiac and Pulmonary Rehab  Cholestrol 0 - 200 mg/dL 409   LDL (calc) 0 - 99 mg/dL 80   HDL-C >81 mg/dL 29   Trlycerides <191 mg/dL 478   Hemoglobin G9F 4.8 - 5.6 % 6.3     Capillary Blood Glucose: No results found for: "GLUCAP"   Exercise Target Goals: Exercise Program Goal: Individual exercise prescription set using results from initial 6 min walk test and THRR while considering  patient's activity barriers and safety.   Exercise Prescription Goal: Initial exercise prescription builds to 30-45 minutes a day of aerobic activity, 2-3 days per week.  Home exercise guidelines will be given to patient during program as part of exercise prescription that the participant will acknowledge.  Activity Barriers & Risk Stratification:  Activity Barriers & Cardiac Risk Stratification - 11/16/22 1340  Activity Barriers & Cardiac Risk Stratification   Activity Barriers None    Cardiac Risk Stratification Moderate             6 Minute Walk:  6 Minute Walk     Row Name 11/16/22 1352         6 Minute Walk   Phase Initial     Distance 1238 feet     Walk Time 6 minutes     # of Rest Breaks 0     MPH 2.34     METS 2.28     RPE 11     Perceived Dyspnea  0     VO2 Peak 7.99     Symptoms No     Resting HR 68 bpm     Resting BP 106/72     Resting Oxygen Saturation  97 %     Exercise Oxygen Saturation  during 6 min walk 98 %     Max Ex. HR 80 bpm     Max Ex.  BP 120/72     2 Minute Post BP 120/70              Oxygen Initial Assessment:   Oxygen Re-Evaluation:   Oxygen Discharge (Final Oxygen Re-Evaluation):   Initial Exercise Prescription:  Initial Exercise Prescription - 11/16/22 1500       Date of Initial Exercise RX and Referring Provider   Date 11/16/22    Referring Provider Corky Crafts, MD    Expected Discharge Date 01/28/23      Recumbant Bike   Level 1    Minutes 15    METs 2.3      NuStep   Level 2    SPM 85    Minutes 15    METs 2.3      Prescription Details   Frequency (times per week) 3    Duration Progress to 30 minutes of continuous aerobic without signs/symptoms of physical distress      Intensity   THRR 40-80% of Max Heartrate 59-118    Ratings of Perceived Exertion 11-13    Perceived Dyspnea 0-4      Progression   Progression Continue to progress workloads to maintain intensity without signs/symptoms of physical distress.      Resistance Training   Training Prescription Yes    Weight 3 lbs    Reps 10-15             Perform Capillary Blood Glucose checks as needed.  Exercise Prescription Changes:   Exercise Prescription Changes     Row Name 11/22/22 1629             Response to Exercise   Blood Pressure (Admit) 124/78       Blood Pressure (Exercise) 138/72       Blood Pressure (Exit) 146/86       Heart Rate (Admit) 67 bpm       Heart Rate (Exercise) 83 bpm       Heart Rate (Exit) 75 bpm       Rating of Perceived Exertion (Exercise) 11       Perceived Dyspnea (Exercise) 0       Symptoms 0       Comments Pt first day in the Pritikin ICR program       Duration Progress to 30 minutes of  aerobic without signs/symptoms of physical distress       Intensity THRR unchanged  Progression   Progression Continue to progress workloads to maintain intensity without signs/symptoms of physical distress.       Average METs 2.25         Resistance Training   Training  Prescription Yes       Weight 3 lbs       Reps 10-15       Time 10 Minutes         Recumbant Bike   Level 1       RPM 71       Watts 21       Minutes 15       METs 2.2         NuStep   Level 2       SPM 98       Minutes 15       METs 2.3                Exercise Comments:   Exercise Comments     Row Name 11/22/22 1634           Exercise Comments Pt first day in the Pritikin ICR program. Pt tolerated exercise well with an average MET level of 2.25. Pt is off to a good start and is learning his THRR, RPE and ExRx                Exercise Goals and Review:   Exercise Goals     Row Name 11/16/22 1418             Exercise Goals   Increase Physical Activity Yes       Intervention Provide advice, education, support and counseling about physical activity/exercise needs.;Develop an individualized exercise prescription for aerobic and resistive training based on initial evaluation findings, risk stratification, comorbidities and participant's personal goals.       Expected Outcomes Short Term: Attend rehab on a regular basis to increase amount of physical activity.;Long Term: Exercising regularly at least 3-5 days a week.;Long Term: Add in home exercise to make exercise part of routine and to increase amount of physical activity.       Increase Strength and Stamina Yes       Intervention Provide advice, education, support and counseling about physical activity/exercise needs.;Develop an individualized exercise prescription for aerobic and resistive training based on initial evaluation findings, risk stratification, comorbidities and participant's personal goals.       Expected Outcomes Short Term: Increase workloads from initial exercise prescription for resistance, speed, and METs.;Short Term: Perform resistance training exercises routinely during rehab and add in resistance training at home;Long Term: Improve cardiorespiratory fitness, muscular endurance and strength as  measured by increased METs and functional capacity ( )       Able to understand and use rate of perceived exertion (RPE) scale Yes       Intervention Provide education and explanation on how to use RPE scale       Expected Outcomes Short Term: Able to use RPE daily in rehab to express subjective intensity level;Long Term:  Able to use RPE to guide intensity level when exercising independently       Knowledge and understanding of Target Heart Rate Range (THRR) Yes       Intervention Provide education and explanation of THRR including how the numbers were predicted and where they are located for reference       Expected Outcomes Short Term: Able to state/look up THRR;Short Term: Able to use daily as guideline  for intensity in rehab;Long Term: Able to use THRR to govern intensity when exercising independently       Able to check pulse independently Yes       Intervention Provide education and demonstration on how to check pulse in carotid and radial arteries.;Review the importance of being able to check your own pulse for safety during independent exercise       Expected Outcomes Short Term: Able to explain why pulse checking is important during independent exercise;Long Term: Able to check pulse independently and accurately       Understanding of Exercise Prescription Yes       Intervention Provide education, explanation, and written materials on patient's individual exercise prescription       Expected Outcomes Short Term: Able to explain program exercise prescription;Long Term: Able to explain home exercise prescription to exercise independently                Exercise Goals Re-Evaluation :  Exercise Goals Re-Evaluation     Row Name 11/22/22 1632             Exercise Goal Re-Evaluation   Exercise Goals Review Increase Physical Activity;Understanding of Exercise Prescription;Increase Strength and Stamina;Knowledge and understanding of Target Heart Rate Range (THRR);Able to understand  and use rate of perceived exertion (RPE) scale       Comments Pt first day in the Pritikin ICR program. Pt tolerated exercise well with an average MET level of 2.25. Pt is off to a good start and is learning his THRR, RPE and ExRx       Expected Outcomes Will continue to monitor pt and progress workloads as tolerated without sign or symptom                Discharge Exercise Prescription (Final Exercise Prescription Changes):  Exercise Prescription Changes - 11/22/22 1629       Response to Exercise   Blood Pressure (Admit) 124/78    Blood Pressure (Exercise) 138/72    Blood Pressure (Exit) 146/86    Heart Rate (Admit) 67 bpm    Heart Rate (Exercise) 83 bpm    Heart Rate (Exit) 75 bpm    Rating of Perceived Exertion (Exercise) 11    Perceived Dyspnea (Exercise) 0    Symptoms 0    Comments Pt first day in the Pritikin ICR program    Duration Progress to 30 minutes of  aerobic without signs/symptoms of physical distress    Intensity THRR unchanged      Progression   Progression Continue to progress workloads to maintain intensity without signs/symptoms of physical distress.    Average METs 2.25      Resistance Training   Training Prescription Yes    Weight 3 lbs    Reps 10-15    Time 10 Minutes      Recumbant Bike   Level 1    RPM 71    Watts 21    Minutes 15    METs 2.2      NuStep   Level 2    SPM 98    Minutes 15    METs 2.3             Nutrition:  Target Goals: Understanding of nutrition guidelines, daily intake of sodium 1500mg , cholesterol 200mg , calories 30% from fat and 7% or less from saturated fats, daily to have 5 or more servings of fruits and vegetables.  Biometrics:  Pre Biometrics - 11/16/22 1300  Pre Biometrics   Waist Circumference 43.5 inches    Hip Circumference 42 inches    Waist to Hip Ratio 1.04 %    Triceps Skinfold 22 mm    % Body Fat 31.4 %    Grip Strength 30 kg    Flexibility 0 in    Single Leg Stand 9.68 seconds               Nutrition Therapy Plan and Nutrition Goals:   Nutrition Assessments:  MEDIFICTS Score Key: ?70 Need to make dietary changes  40-70 Heart Healthy Diet ? 40 Therapeutic Level Cholesterol Diet    Picture Your Plate Scores: <16 Unhealthy dietary pattern with much room for improvement. 41-50 Dietary pattern unlikely to meet recommendations for good health and room for improvement. 51-60 More healthful dietary pattern, with some room for improvement.  >60 Healthy dietary pattern, although there may be some specific behaviors that could be improved.    Nutrition Goals Re-Evaluation:   Nutrition Goals Re-Evaluation:   Nutrition Goals Discharge (Final Nutrition Goals Re-Evaluation):   Psychosocial: Target Goals: Acknowledge presence or absence of significant depression and/or stress, maximize coping skills, provide positive support system. Participant is able to verbalize types and ability to use techniques and skills needed for reducing stress and depression.  Initial Review & Psychosocial Screening:  Initial Psych Review & Screening - 11/16/22 1420       Initial Review   Current issues with Current Depression;Current Stress Concerns    Source of Stress Concerns Chronic Illness    Comments Bryan Thompson admits to being depressed due to his recent hospitalization.      Family Dynamics   Good Support System? Yes   Bryan Thompson has his wife, son and friends for support     Barriers   Psychosocial barriers to participate in program The patient should benefit from training in stress management and relaxation.      Screening Interventions   Interventions Encouraged to exercise;Provide feedback about the scores to participant    Expected Outcomes Long Term Goal: Stressors or current issues are controlled or eliminated.;Short Term goal: Identification and review with participant of any Quality of Life or Depression concerns found by scoring the questionnaire.              Quality of Life Scores:  Quality of Life - 11/16/22 1521       Quality of Life   Select Quality of Life      Quality of Life Scores   Health/Function Pre 22.86 %    Socioeconomic Pre 30 %    Psych/Spiritual Pre 25.07 %    Family Pre 28.8 %    GLOBAL Pre 25.74 %            Scores of 19 and below usually indicate a poorer quality of life in these areas.  A difference of  2-3 points is a clinically meaningful difference.  A difference of 2-3 points in the total score of the Quality of Life Index has been associated with significant improvement in overall quality of life, self-image, physical symptoms, and general health in studies assessing change in quality of life.  PHQ-9: Review Flowsheet       11/16/2022  Depression screen PHQ 2/9  Decreased Interest 0  Down, Depressed, Hopeless 1  PHQ - 2 Score 1  Altered sleeping 0  Tired, decreased energy 1  Change in appetite 0  Feeling bad or failure about yourself  1  Trouble concentrating 0  Moving  slowly or fidgety/restless 1  Suicidal thoughts 0  PHQ-9 Score 4  Difficult doing work/chores Somewhat difficult   Interpretation of Total Score  Total Score Depression Severity:  1-4 = Minimal depression, 5-9 = Mild depression, 10-14 = Moderate depression, 15-19 = Moderately severe depression, 20-27 = Severe depression   Psychosocial Evaluation and Intervention:   Psychosocial Re-Evaluation:   Psychosocial Discharge (Final Psychosocial Re-Evaluation):   Vocational Rehabilitation: Provide vocational rehab assistance to qualifying candidates.   Vocational Rehab Evaluation & Intervention:  Vocational Rehab - 11/16/22 1423       Initial Vocational Rehab Evaluation & Intervention   Assessment shows need for Vocational Rehabilitation No   Bryan Thompson is retired and does not need vocational rehab at this time            Education: Education Goals: Education classes will be provided on a weekly basis, covering required  topics. Participant will state understanding/return demonstration of topics presented.    Education     Row Name 11/22/22 1500     Education   Cardiac Education Topics Pritikin   Licensed conveyancer Nutrition   Nutrition Calorie Density   Instruction Review Code 1- Verbalizes Understanding   Class Start Time 1400   Class Stop Time 1445   Class Time Calculation (min) 45 min            Core Videos: Exercise    Move It!  Clinical staff conducted group or individual video education with verbal and written material and guidebook.  Patient learns the recommended Pritikin exercise program. Exercise with the goal of living a long, healthy life. Some of the health benefits of exercise include controlled diabetes, healthier blood pressure levels, improved cholesterol levels, improved heart and lung capacity, improved sleep, and better body composition. Everyone should speak with their doctor before starting or changing an exercise routine.  Biomechanical Limitations Clinical staff conducted group or individual video education with verbal and written material and guidebook.  Patient learns how biomechanical limitations can impact exercise and how we can mitigate and possibly overcome limitations to have an impactful and balanced exercise routine.  Body Composition Clinical staff conducted group or individual video education with verbal and written material and guidebook.  Patient learns that body composition (ratio of muscle mass to fat mass) is a key component to assessing overall fitness, rather than body weight alone. Increased fat mass, especially visceral belly fat, can put [place] at increased risk for metabolic syndrome, type 2 diabetes, heart disease, and even death. It is recommended to combine diet and exercise (cardiovascular and resistance training) to improve your body composition. Seek guidance from your physician and exercise  physiologist before implementing an exercise routine.  Exercise Action Plan Clinical staff conducted group or individual video education with verbal and written material and guidebook.  Patient learns the recommended strategies to achieve and enjoy long-term exercise adherence, including variety, self-motivation, self-efficacy, and positive decision making. Benefits of exercise include fitness, good health, weight management, more energy, better sleep, less stress, and overall well-being.  Medical   Heart Disease Risk Reduction Clinical staff conducted group or individual video education with verbal and written material and guidebook.  Patient learns our heart is our most vital organ as it circulates oxygen, nutrients, white blood cells, and hormones throughout the entire body, and carries waste away. Data supports a plant-based eating plan like the Pritikin Program for its effectiveness in slowing progression of  and reversing heart disease. The video provides a number of recommendations to address heart disease.   Metabolic Syndrome and Belly Fat  Clinical staff conducted group or individual video education with verbal and written material and guidebook.  Patient learns what metabolic syndrome is, how it leads to heart disease, and how one can reverse it and keep it from coming back. You have metabolic syndrome if you have 3 of the following 5 criteria: abdominal obesity, high blood pressure, high triglycerides, low HDL cholesterol, and high blood sugar.  Hypertension and Heart Disease Clinical staff conducted group or individual video education with verbal and written material and guidebook.  Patient learns that high blood pressure, or hypertension, is very common in the [place]. Hypertension is largely due to excessive salt intake, but other important risk factors include being overweight, physical inactivity, drinking too much alcohol, smoking, and not eating enough potassium from fruits  and vegetables. High blood pressure is a leading risk factor for heart attack, stroke, congestive heart failure, dementia, kidney failure, and premature death. Long-term effects of excessive salt intake include stiffening of the arteries and thickening of heart muscle and organ damage. Recommendations include ways to reduce hypertension and the risk of heart disease.  Diseases of Our Time - Focusing on Diabetes Clinical staff conducted group or individual video education with verbal and written material and guidebook.  Patient learns why the best way to stop diseases of our time is prevention, through food and other lifestyle changes. Medicine (such as prescription pills and surgeries) is often only a Band-Aid on the problem, not a long-term solution. Most common diseases of our time include obesity, type 2 diabetes, hypertension, heart disease, and cancer. The Pritikin Program is recommended and has been proven to help reduce, reverse, and/or prevent the damaging effects of metabolic syndrome.  Nutrition   Overview of the Pritikin Eating Plan  Clinical staff conducted group or individual video education with verbal and written material and guidebook.  Patient learns about the Pritikin Eating Plan for disease risk reduction. The Pritikin Eating Plan emphasizes a wide variety of unrefined, minimally-processed carbohydrates, like fruits, vegetables, whole grains, and legumes. Go, Caution, and Stop food choices are explained. Plant-based and lean animal proteins are emphasized. Rationale provided for low sodium intake for blood pressure control, low added sugars for blood sugar stabilization, and low added fats and oils for coronary artery disease risk reduction and weight management.  Calorie Density  Clinical staff conducted group or individual video education with verbal and written material and guidebook.  Patient learns about calorie density and how it impacts the Pritikin Eating Plan. Knowing the  characteristics of the food you choose will help you decide whether those foods will lead to weight gain or weight loss, and whether you want to consume more or less of them. Weight loss is usually a side effect of the Pritikin Eating Plan because of its focus on low calorie-dense foods.  Label Reading  Clinical staff conducted group or individual video education with verbal and written material and guidebook.  Patient learns about the Pritikin recommended label reading guidelines and corresponding recommendations regarding calorie density, added sugars, sodium content, and whole grains.  Dining Out - Part 1  Clinical staff conducted group or individual video education with verbal and written material and guidebook.  Patient learns that restaurant meals can be sabotaging because they can be so high in calories, fat, sodium, and/or sugar. Patient learns recommended strategies on how to positively address this and  avoid unhealthy pitfalls.  Facts on Fats  Clinical staff conducted group or individual video education with verbal and written material and guidebook.  Patient learns that lifestyle modifications can be just as effective, if not more so, as many medications for lowering your risk of heart disease. A Pritikin lifestyle can help to reduce your risk of inflammation and atherosclerosis (cholesterol build-up, or plaque, in the artery walls). Lifestyle interventions such as dietary choices and physical activity address the cause of atherosclerosis. A review of the types of fats and their impact on blood cholesterol levels, along with dietary recommendations to reduce fat intake is also included.  Nutrition Action Plan  Clinical staff conducted group or individual video education with verbal and written material and guidebook.  Patient learns how to incorporate Pritikin recommendations into their lifestyle. Recommendations include planning and keeping personal health goals in mind as an important  part of their success.  Healthy Mind-Set    Healthy Minds, Bodies, Hearts  Clinical staff conducted group or individual video education with verbal and written material and guidebook.  Patient learns how to identify when they are stressed. Video will discuss the impact of that stress, as well as the many benefits of stress management. Patient will also be introduced to stress management techniques. The way we think, act, and feel has an impact on our hearts.  How Our Thoughts Can Heal Our Hearts  Clinical staff conducted group or individual video education with verbal and written material and guidebook.  Patient learns that negative thoughts can cause depression and anxiety. This can result in negative lifestyle behavior and serious health problems. Cognitive behavioral therapy is an effective method to help control our thoughts in order to change and improve our emotional outlook.  Additional Videos:  Exercise    Improving Performance  Clinical staff conducted group or individual video education with verbal and written material and guidebook.  Patient learns to use a non-linear approach by alternating intensity levels and lengths of time spent exercising to help burn more calories and lose more body fat. Cardiovascular exercise helps improve heart health, metabolism, hormonal balance, blood sugar control, and recovery from fatigue. Resistance training improves strength, endurance, balance, coordination, reaction time, metabolism, and muscle mass. Flexibility exercise improves circulation, posture, and balance. Seek guidance from your physician and exercise physiologist before implementing an exercise routine and learn your capabilities and proper form for all exercise.  Introduction to Yoga  Clinical staff conducted group or individual video education with verbal and written material and guidebook.  Patient learns about yoga, a discipline of the coming together of mind, breath, and body. The  benefits of yoga include improved flexibility, improved range of motion, better posture and core strength, increased lung function, weight loss, and positive self-image. Yoga's heart health benefits include lowered blood pressure, healthier heart rate, decreased cholesterol and triglyceride levels, improved immune function, and reduced stress. Seek guidance from your physician and exercise physiologist before implementing an exercise routine and learn your capabilities and proper form for all exercise.  Medical   Aging: Enhancing Your Quality of Life  Clinical staff conducted group or individual video education with verbal and written material and guidebook.  Patient learns key strategies and recommendations to stay in good physical health and enhance quality of life, such as prevention strategies, having an advocate, securing a Health Care Proxy and Power of Attorney, and keeping a list of medications and system for tracking them. It also discusses how to avoid risk for bone loss.  Biology of Weight Control  Clinical staff conducted group or individual video education with verbal and written material and guidebook.  Patient learns that weight gain occurs because we consume more calories than we burn (eating more, moving less). Even if your body weight is normal, you may have higher ratios of fat compared to muscle mass. Too much body fat puts you at increased risk for cardiovascular disease, heart attack, stroke, type 2 diabetes, and obesity-related cancers. In addition to exercise, following the Pritikin Eating Plan can help reduce your risk.  Decoding Lab Results  Clinical staff conducted group or individual video education with verbal and written material and guidebook.  Patient learns that lab test reflects one measurement whose values change over time and are influenced by many factors, including medication, stress, sleep, exercise, food, hydration, pre-existing medical conditions, and more. It is  recommended to use the knowledge from this video to become more involved with your lab results and evaluate your numbers to speak with your doctor.   Diseases of Our Time - Overview  Clinical staff conducted group or individual video education with verbal and written material and guidebook.  Patient learns that according to the CDC, 50% to 70% of chronic diseases (such as obesity, type 2 diabetes, elevated lipids, hypertension, and heart disease) are avoidable through lifestyle improvements including healthier food choices, listening to satiety cues, and increased physical activity.  Sleep Disorders Clinical staff conducted group or individual video education with verbal and written material and guidebook.  Patient learns how good quality and duration of sleep are important to overall health and well-being. Patient also learns about sleep disorders and how they impact health along with recommendations to address them, including discussing with a physician.  Nutrition  Dining Out - Part 2 Clinical staff conducted group or individual video education with verbal and written material and guidebook.  Patient learns how to plan ahead and communicate in order to maximize their dining experience in a healthy and nutritious manner. Included are recommended food choices based on the type of restaurant the patient is visiting.   Fueling a Banker conducted group or individual video education with verbal and written material and guidebook.  There is a strong connection between our food choices and our health. Diseases like obesity and type 2 diabetes are very prevalent and are in large-part due to lifestyle choices. The Pritikin Eating Plan provides plenty of food and hunger-curbing satisfaction. It is easy to follow, affordable, and helps reduce health risks.  Menu Workshop  Clinical staff conducted group or individual video education with verbal and written material and guidebook.   Patient learns that restaurant meals can sabotage health goals because they are often packed with calories, fat, sodium, and sugar. Recommendations include strategies to plan ahead and to communicate with the manager, chef, or server to help order a healthier meal.  Planning Your Eating Strategy  Clinical staff conducted group or individual video education with verbal and written material and guidebook.  Patient learns about the Pritikin Eating Plan and its benefit of reducing the risk of disease. The Pritikin Eating Plan does not focus on calories. Instead, it emphasizes high-quality, nutrient-rich foods. By knowing the characteristics of the foods, we choose, we can determine their calorie density and make informed decisions.  Targeting Your Nutrition Priorities  Clinical staff conducted group or individual video education with verbal and written material and guidebook.  Patient learns that lifestyle habits have a tremendous impact on disease risk and  progression. This video provides eating and physical activity recommendations based on your personal health goals, such as reducing LDL cholesterol, losing weight, preventing or controlling type 2 diabetes, and reducing high blood pressure.  Vitamins and Minerals  Clinical staff conducted group or individual video education with verbal and written material and guidebook.  Patient learns different ways to obtain key vitamins and minerals, including through a recommended healthy diet. It is important to discuss all supplements you take with your doctor.   Healthy Mind-Set    Smoking Cessation  Clinical staff conducted group or individual video education with verbal and written material and guidebook.  Patient learns that cigarette smoking and tobacco addiction pose a serious health risk which affects millions of people. Stopping smoking will significantly reduce the risk of heart disease, lung disease, and many forms of cancer. Recommended  strategies for quitting are covered, including working with your doctor to develop a successful plan.  Culinary   Becoming a Set designer conducted group or individual video education with verbal and written material and guidebook.  Patient learns that cooking at home can be healthy, cost-effective, quick, and puts them in control. Keys to cooking healthy recipes will include looking at your recipe, assessing your equipment needs, planning ahead, making it simple, choosing cost-effective seasonal ingredients, and limiting the use of added fats, salts, and sugars.  Cooking - Breakfast and Snacks  Clinical staff conducted group or individual video education with verbal and written material and guidebook.  Patient learns how important breakfast is to satiety and nutrition through the entire day. Recommendations include key foods to eat during breakfast to help stabilize blood sugar levels and to prevent overeating at meals later in the day. Planning ahead is also a key component.  Cooking - Educational psychologist conducted group or individual video education with verbal and written material and guidebook.  Patient learns eating strategies to improve overall health, including an approach to cook more at home. Recommendations include thinking of animal protein as a side on your plate rather than center stage and focusing instead on lower calorie dense options like vegetables, fruits, whole grains, and plant-based proteins, such as beans. Making sauces in large quantities to freeze for later and leaving the skin on your vegetables are also recommended to maximize your experience.  Cooking - Healthy Salads and Dressing Clinical staff conducted group or individual video education with verbal and written material and guidebook.  Patient learns that vegetables, fruits, whole grains, and legumes are the foundations of the Pritikin Eating Plan. Recommendations include how to  incorporate each of these in flavorful and healthy salads, and how to create homemade salad dressings. Proper handling of ingredients is also covered. Cooking - Soups and [employer] - Soups and Desserts Clinical staff conducted group or individual video education with verbal and written material and guidebook.  Patient learns that Pritikin soups and desserts make for easy, nutritious, and delicious snacks and meal components that are low in sodium, fat, sugar, and calorie density, while high in vitamins, minerals, and filling fiber. Recommendations include simple and healthy ideas for soups and desserts.   Overview     The Pritikin Solution Program Overview Clinical staff conducted group or individual video education with verbal and written material and guidebook.  Patient learns that the results of the Pritikin Program have been documented in more than 100 articles published in peer-reviewed journals, and the benefits include reducing risk factors for (and, in some  cases, even reversing) high cholesterol, high blood pressure, type 2 diabetes, obesity, and more! An overview of the three key pillars of the Pritikin Program will be covered: eating well, doing regular exercise, and having a healthy mind-set.  WORKSHOPS  Exercise: Exercise Basics: Building Your Action Plan Clinical staff led group instruction and group discussion with PowerPoint presentation and patient guidebook. To enhance the learning environment the use of posters, models and videos may be added. At the conclusion of this workshop, patients will comprehend the difference between physical activity and exercise, as well as the benefits of incorporating both, into their routine. Patients will understand the FITT (Frequency, Intensity, Time, and Type) principle and how to use it to build an exercise action plan. In addition, safety concerns and other considerations for exercise and cardiac rehab will be addressed by the  presenter. The purpose of this lesson is to promote a comprehensive and effective weekly exercise routine in order to improve patients' overall level of fitness.   Managing Heart Disease: Your Path to a Healthier Heart Clinical staff led group instruction and group discussion with PowerPoint presentation and patient guidebook. To enhance the learning environment the use of posters, models and videos may be added.At the conclusion of this workshop, patients will understand the anatomy and physiology of the heart. Additionally, they will understand how Pritikin's three pillars impact the risk factors, the progression, and the management of heart disease.  The purpose of this lesson is to provide a high-level overview of the heart, heart disease, and how the Pritikin lifestyle positively impacts risk factors.  Exercise Biomechanics Clinical staff led group instruction and group discussion with PowerPoint presentation and patient guidebook. To enhance the learning environment the use of posters, models and videos may be added. Patients will learn how the structural parts of their bodies function and how these functions impact their daily activities, movement, and exercise. Patients will learn how to promote a neutral spine, learn how to manage pain, and identify ways to improve their physical movement in order to promote healthy living. The purpose of this lesson is to expose patients to common physical limitations that impact physical activity. Participants will learn practical ways to adapt and manage aches and pains, and to minimize their effect on regular exercise. Patients will learn how to maintain good posture while sitting, walking, and lifting.  Balance Training and Fall Prevention  Clinical staff led group instruction and group discussion with PowerPoint presentation and patient guidebook. To enhance the learning environment the use of posters, models and videos may be added. At the  conclusion of this workshop, patients will understand the importance of their sensorimotor skills (vision, proprioception, and the vestibular system) in maintaining their ability to balance as they age. Patients will apply a variety of balancing exercises that are appropriate for their current level of function. Patients will understand the common causes for poor balance, possible solutions to these problems, and ways to modify their physical environment in order to minimize their fall risk. The purpose of this lesson is to teach patients about the importance of maintaining balance as they age and ways to minimize their risk of falling.  WORKSHOPS   Nutrition:  Fueling a Scientist, research (physical sciences) led group instruction and group discussion with PowerPoint presentation and patient guidebook. To enhance the learning environment the use of posters, models and videos may be added. Patients will review the foundational principles of the [place] and understand what constitutes a serving size in each of  the food groups. Patients will also learn Pritikin-friendly foods that are better choices when away from home and review make-ahead meal and snack options. Calorie density will be reviewed and applied to three nutrition priorities: weight maintenance, weight loss, and weight gain. The purpose of this lesson is to reinforce (in a group setting) the key concepts around what patients are recommended to eat and how to apply these guidelines when away from home by planning and selecting Pritikin-friendly options. Patients will understand how calorie density may be adjusted for different weight management goals.  Mindful Eating  Clinical staff led group instruction and group discussion with PowerPoint presentation and patient guidebook. To enhance the learning environment the use of posters, models and videos may be added. Patients will briefly review the concepts of the [place] and the  importance of low-calorie dense foods. The concept of mindful eating will be introduced as well as the importance of paying attention to internal hunger signals. Triggers for non-hunger eating and techniques for dealing with triggers will be explored. The purpose of this lesson is to provide patients with the opportunity to review the basic principles of the [place], discuss the value of eating mindfully and how to measure internal cues of hunger and fullness using the Hunger Scale. Patients will also discuss reasons for non-hunger eating and learn strategies to use for controlling emotional eating.  Targeting Your Nutrition Priorities Clinical staff led group instruction and group discussion with PowerPoint presentation and patient guidebook. To enhance the learning environment the use of posters, models and videos may be added. Patients will learn how to determine their genetic susceptibility to disease by reviewing their family history. Patients will gain insight into the importance of diet as part of an overall healthy lifestyle in mitigating the impact of genetics and other environmental insults. The purpose of this lesson is to provide patients with the opportunity to assess their personal nutrition priorities by looking at their family history, their own health history and current risk factors. Patients will also be able to discuss ways of prioritizing and modifying the [place] for their highest risk areas  Menu  Clinical staff led group instruction and group discussion with PowerPoint presentation and patient guidebook. To enhance the learning environment the use of posters, models and videos may be added. Using menus brought in from [employer], or printed from [employer], patients will apply the [place] dining out guidelines that were presented in the [employer] video. Patients will also be able to practice these guidelines in a variety of  provided scenarios. The purpose of this lesson is to provide patients with the opportunity to practice hands-on learning of the [employer] with actual menus and practice scenarios.  Label Reading Clinical staff led group instruction and group discussion with PowerPoint presentation and patient guidebook. To enhance the learning environment the use of posters, models and videos may be added. Patients will review and discuss the Pritikin label reading guidelines presented in Pritikin's Label Reading Educational series video. Using fool labels brought in from local grocery stores and markets, patients will apply the label reading guidelines and determine if the packaged food meet the Pritikin guidelines. The purpose of this lesson is to provide patients with the opportunity to review, discuss, and practice hands-on learning of the Pritikin Label Reading guidelines with actual packaged food labels. [place] Workshops are designed to teach patients ways to prepare quick, simple, and affordable  recipes at home. The importance of nutrition's role in chronic disease risk reduction is reflected in its emphasis in the overall Pritikin program. By learning how to prepare essential core Pritikin Eating Plan recipes, patients will increase control over what they eat; be able to customize the flavor of foods without the use of added salt, sugar, or fat; and improve the quality of the food they consume. By learning a set of core recipes which are easily assembled, quickly prepared, and affordable, patients are more likely to prepare more healthy foods at home. These workshops focus on convenient breakfasts, simple entres, side dishes, and desserts which can be prepared with minimal effort and are consistent with nutrition recommendations for cardiovascular risk reduction. Cooking [employer] are taught by a Armed forces logistics/support/administrative officer (RD) who has been trained by the  [employer]. The chef or RD has a clear understanding of the importance of minimizing - if not completely eliminating - added fat, sugar, and sodium in recipes. Throughout the series of Cooking School Workshop sessions, patients will learn about healthy ingredients and efficient methods of cooking to build confidence in their capability to prepare    Cooking School weekly topics:  Adding Flavor- Sodium-Free  Fast and Healthy Breakfasts  Powerhouse Plant-Based Proteins  Satisfying Salads and Dressings  Simple Sides and Sauces  International Cuisine-Spotlight on the [employer] Zones  Delicious Desserts  Savory Soups  [employer] - Meals in a Astronomer Appetizers and Snacks  Comforting Weekend Breakfasts  One-Pot Wonders   Fast Evening Meals  Landscape architect Your Pritikin Plate  WORKSHOPS   Healthy Mindset (Psychosocial):  Focused Goals, Sustainable Changes Clinical staff led group instruction and group discussion with PowerPoint presentation and patient guidebook. To enhance the learning environment the use of posters, models and videos may be added. Patients will be able to apply effective goal setting strategies to establish at least one personal goal, and then take consistent, meaningful action toward that goal. They will learn to identify common barriers to achieving personal goals and develop strategies to overcome them. Patients will also gain an understanding of how our mind-set can impact our ability to achieve goals and the importance of cultivating a positive and growth-oriented mind-set. The purpose of this lesson is to provide patients with a deeper understanding of how to set and achieve personal goals, as well as the tools and strategies needed to overcome common obstacles which may arise along the way.  From Head to Heart: The Power of a Healthy Outlook  Clinical staff led group instruction and group discussion with PowerPoint presentation  and patient guidebook. To enhance the learning environment the use of posters, models and videos may be added. Patients will be able to recognize and describe the impact of emotions and mood on physical health. They will discover the importance of self-care and explore self-care practices which may work for them. Patients will also learn how to utilize the 4 C's to cultivate a healthier outlook and better manage stress and challenges. The purpose of this lesson is to demonstrate to patients how a healthy outlook is an essential part of maintaining good health, especially as they continue their cardiac rehab journey.  Healthy Sleep for a Healthy Heart Clinical staff led group instruction and group discussion with PowerPoint presentation and patient guidebook. To enhance the learning environment the use of posters, models and videos may be added. At the conclusion of this workshop, patients will be able to demonstrate knowledge  of the importance of sleep to overall health, well-being, and quality of life. They will understand the symptoms of, and treatments for, common sleep disorders. Patients will also be able to identify daytime and nighttime behaviors which impact sleep, and they will be able to apply these tools to help manage sleep-related challenges. The purpose of this lesson is to provide patients with a general overview of sleep and outline the importance of quality sleep. Patients will learn about a few of the most common sleep disorders. Patients will also be introduced to the concept of "sleep hygiene," and discover ways to self-manage certain sleeping problems through simple daily behavior changes. Finally, the workshop will motivate patients by clarifying the links between quality sleep and their goals of heart-healthy living.   Recognizing and Reducing Stress Clinical staff led group instruction and group discussion with PowerPoint presentation and patient guidebook. To enhance the learning  environment the use of posters, models and videos may be added. At the conclusion of this workshop, patients will be able to understand the types of stress reactions, differentiate between acute and chronic stress, and recognize the impact that chronic stress has on their health. They will also be able to apply different coping mechanisms, such as reframing negative self-talk. Patients will have the opportunity to practice a variety of stress management techniques, such as deep abdominal breathing, progressive muscle relaxation, and/or guided imagery.  The purpose of this lesson is to educate patients on the role of stress in their lives and to provide healthy techniques for coping with it.  Learning Barriers/Preferences:  Learning Barriers/Preferences - 11/16/22 1432       Learning Barriers/Preferences   Learning Barriers None    Learning Preferences None             Education Topics:  Knowledge Questionnaire Score:  Knowledge Questionnaire Score - 11/16/22 1522       Knowledge Questionnaire Score   Pre Score 19/24             Core Components/Risk Factors/Patient Goals at Admission:  Personal Goals and Risk Factors at Admission - 11/16/22 1437       Core Components/Risk Factors/Patient Goals on Admission   Hypertension Yes    Intervention Provide education on lifestyle modifcations including regular physical activity/exercise, weight management, moderate sodium restriction and increased consumption of fresh fruit, vegetables, and low fat dairy, alcohol moderation, and smoking cessation.;Monitor prescription use compliance.    Expected Outcomes Short Term: Continued assessment and intervention until BP is < 140/42mm HG in hypertensive participants. < 130/31mm HG in hypertensive participants with diabetes, heart failure or chronic kidney disease.;Long Term: Maintenance of blood pressure at goal levels.    Lipids Yes    Intervention Provide education and support for participant  on nutrition & aerobic/resistive exercise along with prescribed medications to achieve LDL 70mg , HDL >40mg .    Expected Outcomes Short Term: Participant states understanding of desired cholesterol values and is compliant with medications prescribed. Participant is following exercise prescription and nutrition guidelines.;Long Term: Cholesterol controlled with medications as prescribed, with individualized exercise RX and with personalized nutrition plan. Value goals: LDL < 70mg , HDL > 40 mg.             Core Components/Risk Factors/Patient Goals Review:    Core Components/Risk Factors/Patient Goals at Discharge (Final Review):    ITP Comments:  ITP Comments     Row Name 11/16/22 1300           ITP Comments Medical Director- Dr.  Armanda Magic, MD. Introduction to [facility]/ Intensive Cardiac Rehab. Reviewed orientation packet.                Comments: Pt started cardiac rehab today.  Pt tolerated light exercise without difficulty. VSS, telemetry-Sinus Rhythm, asymptomatic.  Medication list reconciled. Pt denies barriers to medicaiton compliance.  PSYCHOSOCIAL ASSESSMENT:  PHQ-4. Pt exhibits positive coping skills, hopeful outlook with supportive family. No psychosocial needs identified at this time, no psychosocial interventions necessary.   Pt oriented to exercise equipment and routine.    Understanding verbalized. Thayer Headings RN BSN

## 2022-11-24 ENCOUNTER — Encounter (HOSPITAL_COMMUNITY)
Admission: RE | Admit: 2022-11-24 | Discharge: 2022-11-24 | Disposition: A | Discharge status: Home / Self Care | Payer: Medicare HMO | Source: Ambulatory Visit | Attending: Interventional Cardiology | Admitting: Interventional Cardiology

## 2022-11-24 ENCOUNTER — Other Ambulatory Visit (HOSPITAL_COMMUNITY): Payer: Self-pay

## 2022-11-24 DIAGNOSIS — I214 Non-ST elevation (NSTEMI) myocardial infarction: Secondary | ICD-10-CM

## 2022-11-24 DIAGNOSIS — I252 Old myocardial infarction: Secondary | ICD-10-CM | POA: Diagnosis not present

## 2022-11-24 DIAGNOSIS — Z955 Presence of coronary angioplasty implant and graft: Secondary | ICD-10-CM

## 2022-11-24 DIAGNOSIS — Z48812 Encounter for surgical aftercare following surgery on the circulatory system: Secondary | ICD-10-CM | POA: Diagnosis not present

## 2022-11-25 ENCOUNTER — Other Ambulatory Visit: Payer: Self-pay | Admitting: Physician Assistant

## 2022-11-26 ENCOUNTER — Encounter (HOSPITAL_COMMUNITY): Payer: Medicare HMO

## 2022-11-26 NOTE — Telephone Encounter (Signed)
If pt is not supposed to still be taking it, can you please remove it from medication list? Please address

## 2022-11-26 NOTE — Telephone Encounter (Signed)
Pt's pharmacy is requesting a refill on colchicine 0.6 mg tablets. Would Dr. Eldridge Dace like to refill this medication? Please address

## 2022-11-26 NOTE — Telephone Encounter (Signed)
Patient needs to continue colchicine until 8/11.  It can be removed when he completes course on 8/11

## 2022-11-26 NOTE — Telephone Encounter (Signed)
Per 6/11 phone note patient to continue colchicine for 2 months and then stop.  A prescription for 60 tablets was sent on 6/11.  Colchicine should not be refilled.

## 2022-11-29 ENCOUNTER — Encounter (HOSPITAL_COMMUNITY)
Admission: RE | Admit: 2022-11-29 | Discharge: 2022-11-29 | Disposition: A | Discharge status: Home / Self Care | Payer: Medicare HMO | Source: Ambulatory Visit | Attending: Interventional Cardiology | Admitting: Interventional Cardiology

## 2022-11-29 DIAGNOSIS — Z955 Presence of coronary angioplasty implant and graft: Secondary | ICD-10-CM | POA: Diagnosis not present

## 2022-11-29 DIAGNOSIS — I214 Non-ST elevation (NSTEMI) myocardial infarction: Secondary | ICD-10-CM | POA: Diagnosis not present

## 2022-11-29 DIAGNOSIS — I252 Old myocardial infarction: Secondary | ICD-10-CM | POA: Diagnosis not present

## 2022-11-29 DIAGNOSIS — Z48812 Encounter for surgical aftercare following surgery on the circulatory system: Secondary | ICD-10-CM | POA: Diagnosis not present

## 2022-12-01 ENCOUNTER — Encounter (HOSPITAL_COMMUNITY)
Admission: RE | Admit: 2022-12-01 | Discharge: 2022-12-01 | Disposition: A | Discharge status: Home / Self Care | Payer: Medicare HMO | Source: Ambulatory Visit | Attending: Interventional Cardiology | Admitting: Interventional Cardiology

## 2022-12-01 DIAGNOSIS — I214 Non-ST elevation (NSTEMI) myocardial infarction: Secondary | ICD-10-CM

## 2022-12-01 DIAGNOSIS — N1831 Chronic kidney disease, stage 3a: Secondary | ICD-10-CM | POA: Diagnosis not present

## 2022-12-01 DIAGNOSIS — I252 Old myocardial infarction: Secondary | ICD-10-CM | POA: Diagnosis not present

## 2022-12-01 DIAGNOSIS — E039 Hypothyroidism, unspecified: Secondary | ICD-10-CM | POA: Diagnosis not present

## 2022-12-01 DIAGNOSIS — Z955 Presence of coronary angioplasty implant and graft: Secondary | ICD-10-CM | POA: Diagnosis not present

## 2022-12-01 DIAGNOSIS — E785 Hyperlipidemia, unspecified: Secondary | ICD-10-CM | POA: Diagnosis not present

## 2022-12-01 DIAGNOSIS — Z48812 Encounter for surgical aftercare following surgery on the circulatory system: Secondary | ICD-10-CM | POA: Diagnosis not present

## 2022-12-01 DIAGNOSIS — I129 Hypertensive chronic kidney disease with stage 1 through stage 4 chronic kidney disease, or unspecified chronic kidney disease: Secondary | ICD-10-CM | POA: Diagnosis not present

## 2022-12-01 DIAGNOSIS — D649 Anemia, unspecified: Secondary | ICD-10-CM | POA: Diagnosis not present

## 2022-12-01 DIAGNOSIS — Z7689 Persons encountering health services in other specified circumstances: Secondary | ICD-10-CM | POA: Diagnosis not present

## 2022-12-02 DIAGNOSIS — E039 Hypothyroidism, unspecified: Secondary | ICD-10-CM | POA: Diagnosis not present

## 2022-12-02 DIAGNOSIS — I129 Hypertensive chronic kidney disease with stage 1 through stage 4 chronic kidney disease, or unspecified chronic kidney disease: Secondary | ICD-10-CM | POA: Diagnosis not present

## 2022-12-02 DIAGNOSIS — E785 Hyperlipidemia, unspecified: Secondary | ICD-10-CM | POA: Diagnosis not present

## 2022-12-03 ENCOUNTER — Encounter (HOSPITAL_COMMUNITY): Payer: Medicare HMO

## 2022-12-06 ENCOUNTER — Encounter (HOSPITAL_COMMUNITY)
Admission: RE | Admit: 2022-12-06 | Discharge: 2022-12-06 | Disposition: A | Discharge status: Home / Self Care | Payer: Medicare HMO | Source: Ambulatory Visit | Attending: Interventional Cardiology | Admitting: Interventional Cardiology

## 2022-12-06 DIAGNOSIS — Z48812 Encounter for surgical aftercare following surgery on the circulatory system: Secondary | ICD-10-CM | POA: Diagnosis not present

## 2022-12-06 DIAGNOSIS — I214 Non-ST elevation (NSTEMI) myocardial infarction: Secondary | ICD-10-CM | POA: Diagnosis not present

## 2022-12-06 DIAGNOSIS — I252 Old myocardial infarction: Secondary | ICD-10-CM | POA: Diagnosis not present

## 2022-12-06 DIAGNOSIS — Z955 Presence of coronary angioplasty implant and graft: Secondary | ICD-10-CM | POA: Diagnosis not present

## 2022-12-06 NOTE — Progress Notes (Signed)
CARDIAC REHAB PHASE 2  Reviewed home exercise with pt today. Pt is tolerating exercise well. Pt is meeting ACSM standards and will continue to exercise on his own by walking 2 miles for 30-45 minutes per session 4-5 days a week in addition to the 3 days in CRP2. Advised pt on THRR, RPE scale, hydration and temperature/humidity precautions. Reinforced NTG use, S/S to stop exercise and when to call MD vs 911. Encouraged warm up cool down and stretches with exercise sessions. Pt verbalized understanding, all questions were answered and pt was given a copy to take home.    Harrie Jeans ACSM-CEP 12/06/2022 5:06 PM

## 2022-12-08 ENCOUNTER — Encounter (HOSPITAL_COMMUNITY): Payer: Medicare HMO

## 2022-12-08 DIAGNOSIS — I214 Non-ST elevation (NSTEMI) myocardial infarction: Secondary | ICD-10-CM | POA: Diagnosis not present

## 2022-12-08 DIAGNOSIS — E039 Hypothyroidism, unspecified: Secondary | ICD-10-CM | POA: Diagnosis not present

## 2022-12-08 DIAGNOSIS — E785 Hyperlipidemia, unspecified: Secondary | ICD-10-CM | POA: Diagnosis not present

## 2022-12-08 DIAGNOSIS — Z48812 Encounter for surgical aftercare following surgery on the circulatory system: Secondary | ICD-10-CM | POA: Diagnosis not present

## 2022-12-08 DIAGNOSIS — N1831 Chronic kidney disease, stage 3a: Secondary | ICD-10-CM | POA: Diagnosis not present

## 2022-12-08 DIAGNOSIS — R7301 Impaired fasting glucose: Secondary | ICD-10-CM | POA: Diagnosis not present

## 2022-12-08 DIAGNOSIS — D472 Monoclonal gammopathy: Secondary | ICD-10-CM | POA: Diagnosis not present

## 2022-12-08 DIAGNOSIS — F329 Major depressive disorder, single episode, unspecified: Secondary | ICD-10-CM | POA: Diagnosis not present

## 2022-12-08 DIAGNOSIS — Z955 Presence of coronary angioplasty implant and graft: Secondary | ICD-10-CM

## 2022-12-08 DIAGNOSIS — D649 Anemia, unspecified: Secondary | ICD-10-CM | POA: Diagnosis not present

## 2022-12-08 DIAGNOSIS — M1990 Unspecified osteoarthritis, unspecified site: Secondary | ICD-10-CM | POA: Diagnosis not present

## 2022-12-08 DIAGNOSIS — L299 Pruritus, unspecified: Secondary | ICD-10-CM | POA: Diagnosis not present

## 2022-12-08 DIAGNOSIS — D126 Benign neoplasm of colon, unspecified: Secondary | ICD-10-CM | POA: Diagnosis not present

## 2022-12-08 DIAGNOSIS — I129 Hypertensive chronic kidney disease with stage 1 through stage 4 chronic kidney disease, or unspecified chronic kidney disease: Secondary | ICD-10-CM | POA: Diagnosis not present

## 2022-12-08 DIAGNOSIS — I252 Old myocardial infarction: Secondary | ICD-10-CM | POA: Diagnosis not present

## 2022-12-10 ENCOUNTER — Encounter (HOSPITAL_COMMUNITY): Payer: Medicare HMO

## 2022-12-13 ENCOUNTER — Encounter (HOSPITAL_COMMUNITY)
Admission: RE | Admit: 2022-12-13 | Discharge: 2022-12-13 | Disposition: A | Discharge status: Home / Self Care | Payer: Medicare HMO | Source: Ambulatory Visit | Attending: Interventional Cardiology | Admitting: Interventional Cardiology

## 2022-12-13 DIAGNOSIS — I214 Non-ST elevation (NSTEMI) myocardial infarction: Secondary | ICD-10-CM | POA: Diagnosis not present

## 2022-12-13 DIAGNOSIS — Z955 Presence of coronary angioplasty implant and graft: Secondary | ICD-10-CM | POA: Diagnosis not present

## 2022-12-13 DIAGNOSIS — Z48812 Encounter for surgical aftercare following surgery on the circulatory system: Secondary | ICD-10-CM | POA: Diagnosis not present

## 2022-12-13 DIAGNOSIS — I252 Old myocardial infarction: Secondary | ICD-10-CM | POA: Diagnosis not present

## 2022-12-15 ENCOUNTER — Encounter (HOSPITAL_COMMUNITY): Admission: RE | Admit: 2022-12-15 | Payer: Medicare HMO | Source: Ambulatory Visit

## 2022-12-15 ENCOUNTER — Encounter: Payer: Self-pay | Admitting: Interventional Cardiology

## 2022-12-15 DIAGNOSIS — I214 Non-ST elevation (NSTEMI) myocardial infarction: Secondary | ICD-10-CM

## 2022-12-15 DIAGNOSIS — Z48812 Encounter for surgical aftercare following surgery on the circulatory system: Secondary | ICD-10-CM | POA: Diagnosis not present

## 2022-12-15 DIAGNOSIS — I252 Old myocardial infarction: Secondary | ICD-10-CM | POA: Diagnosis not present

## 2022-12-15 DIAGNOSIS — Z955 Presence of coronary angioplasty implant and graft: Secondary | ICD-10-CM | POA: Diagnosis not present

## 2022-12-16 ENCOUNTER — Ambulatory Visit: Payer: Medicare HMO | Attending: Interventional Cardiology

## 2022-12-16 DIAGNOSIS — E785 Hyperlipidemia, unspecified: Secondary | ICD-10-CM | POA: Diagnosis not present

## 2022-12-17 ENCOUNTER — Telehealth: Payer: Self-pay | Admitting: Interventional Cardiology

## 2022-12-17 ENCOUNTER — Encounter (HOSPITAL_COMMUNITY): Payer: Medicare HMO

## 2022-12-17 NOTE — Telephone Encounter (Signed)
Discussed lab results with patient and his wife Lupita Leash.  Patient states he has not yet started prescription for Repatha. He held off due to concern of possible side effects. He reports about 2 weeks ago he discussed his concerns with his PCP and their Pharm D and was encouraged to start Repatha.  Patient also states he wanted to see what labs showed after making diet and lifestyle changes. He reports weight loss of 22 lbs thus far. Congratulated patient and advised him to keep up the positive changes.  Patient states he plans to start Repatha on August 1st for ease of administration scheduling. He states he plans to follow-up on labs with PCP in October at his annual physical.  Patient has an appt with Dr. Eldridge Dace on 12/27/22.   Lupita Leash, patient's wife, asked if Effient causes cholesterol to increase. She has read about it doing this, and would like to know if that is common/possible. Will forward to Dr. Eldridge Dace and Pharm D to review and address this questions regarding Effient and elevated cholesterol levels.

## 2022-12-17 NOTE — Telephone Encounter (Signed)
Sent MyChart message with PharmD's response to patient.

## 2022-12-17 NOTE — Telephone Encounter (Signed)
-----   Message from [place] sent at 12/17/2022  8:16 AM EDT ----- LDL above target.  Lipid clinic patient

## 2022-12-17 NOTE — Telephone Encounter (Signed)
In the clinical trial that got effient approved hyperlididemia was reported in 7% of people in the effient arm and 7.4% in the clopidogrel arm. The significance of the increase in cholesterol is not know nor if it really was due to medication. Since patient had a NSTEMI with DES placed, it is imperative that he be on dual antiplatelet therapy, therefore the befit outweight the risk in this case.

## 2022-12-20 ENCOUNTER — Encounter (HOSPITAL_COMMUNITY): Admission: RE | Admit: 2022-12-20 | Payer: Medicare HMO | Source: Ambulatory Visit

## 2022-12-20 DIAGNOSIS — Z48812 Encounter for surgical aftercare following surgery on the circulatory system: Secondary | ICD-10-CM | POA: Diagnosis not present

## 2022-12-20 DIAGNOSIS — I214 Non-ST elevation (NSTEMI) myocardial infarction: Secondary | ICD-10-CM | POA: Diagnosis not present

## 2022-12-20 DIAGNOSIS — Z955 Presence of coronary angioplasty implant and graft: Secondary | ICD-10-CM

## 2022-12-20 DIAGNOSIS — I252 Old myocardial infarction: Secondary | ICD-10-CM | POA: Diagnosis not present

## 2022-12-22 ENCOUNTER — Encounter (HOSPITAL_COMMUNITY)
Admission: RE | Admit: 2022-12-22 | Discharge: 2022-12-22 | Disposition: A | Discharge status: Home / Self Care | Payer: Medicare HMO | Source: Ambulatory Visit | Attending: Interventional Cardiology | Admitting: Interventional Cardiology

## 2022-12-22 DIAGNOSIS — Z48812 Encounter for surgical aftercare following surgery on the circulatory system: Secondary | ICD-10-CM | POA: Diagnosis not present

## 2022-12-22 DIAGNOSIS — I214 Non-ST elevation (NSTEMI) myocardial infarction: Secondary | ICD-10-CM | POA: Diagnosis not present

## 2022-12-22 DIAGNOSIS — Z955 Presence of coronary angioplasty implant and graft: Secondary | ICD-10-CM

## 2022-12-22 DIAGNOSIS — I252 Old myocardial infarction: Secondary | ICD-10-CM | POA: Diagnosis not present

## 2022-12-23 ENCOUNTER — Telehealth: Payer: Self-pay | Admitting: Pharmacist

## 2022-12-23 NOTE — Telephone Encounter (Signed)
Spoke to patient and his wife.  He will take his 1st injection today. Reports his annual physical is due mid Oct so will get follow up lab then.

## 2022-12-24 ENCOUNTER — Encounter (HOSPITAL_COMMUNITY): Payer: Medicare HMO

## 2022-12-26 NOTE — Progress Notes (Unsigned)
Cardiology Office Note   Date:  12/27/2022   ID:  Bryan Thompson, DOB 11-03-1950, MRN [id number]  PCP:  Chilton Greathouse, MD    No chief complaint on file.  CAD  Wt Readings from Last 3 Encounters:  12/27/22 205 lb 3.2 oz (93.1 kg)  11/16/22 208 lb 5.4 oz (94.5 kg)  10/01/22 221 lb (100.2 kg)       History of Present Illness: Bryan Thompson is a 72 y.o. male   with a hx of HTN, HLD with prior statin intolerance, MGUS followed at cancer center, depression, probable CKD stage 2, hypothyroidism      Patient admitted with NSTEMI 09/29/22 treated with DES Cfx with significant thrombus burden with residual nonobstructive CAD. Couldn't afford brilinta so changed to effient. He also had SVT improved with BB and pericarditis treated with colchicine for 3 months.   In May 2024 with Herma Carson: "Patient comes in with his wife. He doesn't feel well. Feels like he has the flu. Dull headache, diarrhea 2-3 times/day. Weak, washed out. Denies chest pain, palpitations, edema. Occasional DOE. Labs at PCP yest Plt high 665. Agreed to try lipitor and see if he tolerates-hasn't tolerated simvastatin in past due to body aches. Many questions answered."  Today, he reports fatigue but this is improving with cardiac rehab.  He thinkks his energy is improving.  Took repatha on Aug 1.  Did not tolerate statins.  Lost 24 lbs.    Denies : Chest pain.  Leg edema. Nitroglycerin use. Orthopnea. Palpitations. Paroxysmal nocturnal dyspnea. Shortness of breath. Syncope.    Rare dizziness when he stands quickly.  Past Medical History:  Diagnosis Date   COVID-19    Depression    Hernia, umbilical    Hyperlipidemia    Hypertension    Thyroid disease     Past Surgical History:  Procedure Laterality Date   CATARACT EXTRACTION     CORONARY STENT INTERVENTION N/A 09/29/2022   Procedure: CORONARY STENT INTERVENTION;  Surgeon: Lennette Bihari, MD;  Location: [facility] INVASIVE CV LAB;  Service: Cardiovascular;   Laterality: N/A;   CORONARY THROMBECTOMY N/A 09/29/2022   Procedure: Coronary Thrombectomy;  Surgeon: Lennette Bihari, MD;  Location[facility] INVASIVE CV LAB;  Service: Cardiovascular;  Laterality: N/A;   LEFT HEART CATH AND CORONARY ANGIOGRAPHY N/A 09/29/2022   Procedure: LEFT HEART CATH AND CORONARY ANGIOGRAPHY;  Surgeon: Lennette Bihari, MD;  Location: [facility] INVASIVE CV LAB;  Service: Cardiovascular;  Laterality: N/A;   TONSILLECTOMY       Current Outpatient Medications  Medication Sig Dispense Refill   acetaminophen (TYLENOL) 500 MG tablet Take 1,000 mg by mouth every 8 (eight) hours as needed for moderate pain, fever or headache.     aspirin EC 81 MG tablet Take 1 tablet (81 mg total) by mouth daily. Swallow whole. 30 tablet 12   Evolocumab (REPATHA SURECLICK) 140 MG/ML SOAJ Inject 140 mg into the skin every 14 (fourteen) days. 6 mL 3   levothyroxine (SYNTHROID) 137 MCG tablet Take 137 mcg by mouth daily before breakfast.     metoprolol succinate (TOPROL-XL) 25 MG 24 hr tablet Take 0.5 tablets (12.5 mg total) by mouth daily. 30 tablet 2   Multiple Vitamins-Minerals (CENTRUM SILVER PO) Take 1 tablet by mouth daily in the afternoon.     nitroGLYCERIN (NITROSTAT) 0.4 MG SL tablet Place 1 tablet (0.4 mg total) under the tongue every 5 (five) minutes as needed for chest pain. 25 tablet 3  prasugrel (EFFIENT) 10 MG TABS tablet Take 1 tablet (10 mg total) by mouth daily. 30 tablet 2   sertraline (ZOLOFT) 100 MG tablet Take 100 mg by mouth daily in the afternoon.     vitamin B-12 (CYANOCOBALAMIN) 1000 MCG tablet Take 1,000 mcg by mouth daily in the afternoon.     No current facility-administered medications for this visit.    Allergies:   Fish allergy and Prednisone    Social History:  The patient  reports that he has never smoked. He has never used smokeless tobacco. He reports that he does not currently use alcohol. He reports that he does not use drugs.   Family History:  The patient's family  history includes Cancer in his mother; Colon polyps in his father; Heart disease in his mother; Hypertension in his father.    ROS:  Please see the history of present illness.   Otherwise, review of systems are positive for bruising.   All other systems are reviewed and negative.    PHYSICAL EXAM: VS:  BP 116/62   Pulse 61   Ht 5\' 10"  (1.778 m)   Wt 205 lb 3.2 oz (93.1 kg)   SpO2 97%   BMI 29.44 kg/m  , BMI Body mass index is 29.44 kg/m. GEN: Well nourished, well developed, in no acute distress HEENT: normal Neck: no JVD, carotid bruits, or masses Cardiac: RRR; no murmurs, rubs, or gallops,no edema  Respiratory:  clear to auscultation bilaterally, normal work of breathing GI: soft, nontender, nondistended, + BS MS: no deformity or atrophy Skin: warm and dry, no rash Neuro:  Strength and sensation are intact Psych: euthymic mood, full affect   EKG:   The ekg ordered today demonstrates normal sinus rhythm, nonspecific ST-T wave changes in the lateral leads   Recent Labs: 09/29/2022: ALT 28; TSH 1.187 09/30/2022: BUN 13; Creatinine, Ser 1.27; Hemoglobin 10.4; Platelets 280; Potassium 4.0; Sodium 132 10/01/2022: Magnesium 1.8   Lipid Panel    Component Value Date/Time   CHOL 181 12/16/2022 0931   TRIG 173 (H) 12/16/2022 0931   HDL 29 (L) 12/16/2022 0931   CHOLHDL 6.2 (H) 12/16/2022 0931   CHOLHDL 4.6 09/29/2022 1251   VLDL 24 09/29/2022 1251   LDLCALC 121 (H) 12/16/2022 0931     Other studies Reviewed: Additional studies/ records that were reviewed today with results demonstrating: LDL 121.   ASSESSMENT AND PLAN:  CAD/Old MI: Status post circumflex stent.  Continue dual antiplatelet therapy.  No internal bleeding.  EF 50-55%.  SVT: Treated with beta-blocker.  If he had refractory symptoms, would send for EP evaluation.  No recent palpitations in low dose Metoprolol. Pericarditis: was treated with colchicine for several months.  Finished 2 month course. No further chest  pain.  Did not fill the third month. Hyperlipidemia: started repatha a few days ago and tolerated first dose well- $141/3 months cost to patient.  Has been seen in the lipid clinic.  Explained that the LDL at the time of the MI in May 2024 was falsely low.  More recent LDL increased to 121.  Wife, who is a retired Engineer, civil (consulting), was concerned that the prasugrel had raised his LDL.   Current medicines are reviewed at length with the patient today.  The patient concerns regarding his medicines were addressed.  The following changes have been made:  No change  Labs/ tests ordered today include:   Orders Placed This Encounter  Procedures   EKG 12-Lead    Recommend 150 minutes/week  of aerobic exercise Low fat, low carb, high fiber diet recommended  Disposition:   FU in May 2025- Dr. Tresa Endo   Signed, Lance Muss, MD  12/27/2022 8:55 AM    [facility] [address] [place], [place], [place]  [address] Phone: [phone]; Fax: [phone]

## 2022-12-27 ENCOUNTER — Encounter (HOSPITAL_COMMUNITY)
Admission: RE | Admit: 2022-12-27 | Discharge: 2022-12-27 | Disposition: A | Discharge status: Home / Self Care | Payer: Medicare HMO | Source: Ambulatory Visit | Attending: Interventional Cardiology | Admitting: Interventional Cardiology

## 2022-12-27 ENCOUNTER — Ambulatory Visit: Payer: Medicare HMO | Admitting: Interventional Cardiology

## 2022-12-27 ENCOUNTER — Ambulatory Visit: Payer: Medicare HMO | Attending: Interventional Cardiology | Admitting: Interventional Cardiology

## 2022-12-27 ENCOUNTER — Encounter: Payer: Self-pay | Admitting: Interventional Cardiology

## 2022-12-27 VITALS — BP 116/62 | HR 61 | Ht 70.0 in | Wt 205.2 lb

## 2022-12-27 DIAGNOSIS — I214 Non-ST elevation (NSTEMI) myocardial infarction: Secondary | ICD-10-CM

## 2022-12-27 DIAGNOSIS — I2510 Atherosclerotic heart disease of native coronary artery without angina pectoris: Secondary | ICD-10-CM

## 2022-12-27 DIAGNOSIS — I252 Old myocardial infarction: Secondary | ICD-10-CM | POA: Diagnosis not present

## 2022-12-27 DIAGNOSIS — Z48812 Encounter for surgical aftercare following surgery on the circulatory system: Secondary | ICD-10-CM | POA: Insufficient documentation

## 2022-12-27 DIAGNOSIS — Z955 Presence of coronary angioplasty implant and graft: Secondary | ICD-10-CM | POA: Insufficient documentation

## 2022-12-27 DIAGNOSIS — I471 Supraventricular tachycardia, unspecified: Secondary | ICD-10-CM | POA: Diagnosis not present

## 2022-12-27 DIAGNOSIS — E7849 Other hyperlipidemia: Secondary | ICD-10-CM

## 2022-12-27 MED ORDER — METOPROLOL SUCCINATE ER 25 MG PO TB24
12.5000 mg | ORAL_TABLET | Freq: Every day | ORAL | 3 refills | Status: DC
  Filled 2023-01-19: qty 30, 60d supply, fill #0
  Filled 2023-03-18: qty 30, 60d supply, fill #1
  Filled 2023-05-15: qty 30, 60d supply, fill #2
  Filled 2023-07-15: qty 30, 60d supply, fill #3

## 2022-12-27 MED ORDER — PRASUGREL HCL 10 MG PO TABS
10.0000 mg | ORAL_TABLET | Freq: Every day | ORAL | 3 refills | Status: DC
  Filled 2022-12-29: qty 30, 30d supply, fill #0
  Filled 2023-01-19: qty 30, 30d supply, fill #1
  Filled 2023-02-18: qty 30, 30d supply, fill #2
  Filled 2023-03-18: qty 30, 30d supply, fill #3

## 2022-12-27 NOTE — Patient Instructions (Signed)
Medication Instructions:  Your physician recommends that you continue on your current medications as directed. Please refer to the Current Medication list given to you today. REFILLED: Prasugrel and Metoprolol   *If you need a refill on your cardiac medications before your next appointment, please call your pharmacy*   Lab Work: NONE If you have labs (blood work) drawn today and your tests are completely normal, you will receive your results only by: MyChart Message (if you have MyChart) OR A paper copy in the mail If you have any lab test that is abnormal or we need to change your treatment, we will call you to review the results.   Testing/Procedures: NONE   Follow-Up: At [facility], you and your health needs are our priority.  As part of our continuing mission to provide you with exceptional heart care, we have created designated Provider Care Teams.  These Care Teams include your primary Cardiologist (physician) and Advanced Practice Providers (APPs -  Physician Assistants and Nurse Practitioners) who all work together to provide you with the care you need, when you need it.    Your next appointment:   9 month(s)  Provider:   Dr. Tresa Endo

## 2022-12-29 ENCOUNTER — Encounter (HOSPITAL_COMMUNITY)
Admission: RE | Admit: 2022-12-29 | Discharge: 2022-12-29 | Disposition: A | Discharge status: Home / Self Care | Payer: Medicare HMO | Source: Ambulatory Visit | Attending: Interventional Cardiology | Admitting: Interventional Cardiology

## 2022-12-29 ENCOUNTER — Other Ambulatory Visit (HOSPITAL_BASED_OUTPATIENT_CLINIC_OR_DEPARTMENT_OTHER): Payer: Self-pay

## 2022-12-29 ENCOUNTER — Other Ambulatory Visit: Payer: Self-pay

## 2022-12-29 DIAGNOSIS — I214 Non-ST elevation (NSTEMI) myocardial infarction: Secondary | ICD-10-CM | POA: Diagnosis not present

## 2022-12-29 DIAGNOSIS — Z955 Presence of coronary angioplasty implant and graft: Secondary | ICD-10-CM

## 2022-12-29 DIAGNOSIS — I252 Old myocardial infarction: Secondary | ICD-10-CM | POA: Diagnosis not present

## 2022-12-29 DIAGNOSIS — Z48812 Encounter for surgical aftercare following surgery on the circulatory system: Secondary | ICD-10-CM | POA: Diagnosis not present

## 2022-12-31 ENCOUNTER — Encounter (HOSPITAL_COMMUNITY): Payer: Medicare HMO

## 2022-12-31 ENCOUNTER — Other Ambulatory Visit (HOSPITAL_BASED_OUTPATIENT_CLINIC_OR_DEPARTMENT_OTHER): Payer: Self-pay

## 2023-01-03 ENCOUNTER — Encounter (HOSPITAL_COMMUNITY)
Admission: RE | Admit: 2023-01-03 | Discharge: 2023-01-03 | Disposition: A | Discharge status: Home / Self Care | Payer: Medicare HMO | Source: Ambulatory Visit | Attending: Interventional Cardiology | Admitting: Interventional Cardiology

## 2023-01-03 DIAGNOSIS — I214 Non-ST elevation (NSTEMI) myocardial infarction: Secondary | ICD-10-CM

## 2023-01-03 DIAGNOSIS — Z955 Presence of coronary angioplasty implant and graft: Secondary | ICD-10-CM | POA: Diagnosis not present

## 2023-01-03 DIAGNOSIS — Z48812 Encounter for surgical aftercare following surgery on the circulatory system: Secondary | ICD-10-CM | POA: Diagnosis not present

## 2023-01-03 DIAGNOSIS — I252 Old myocardial infarction: Secondary | ICD-10-CM | POA: Diagnosis not present

## 2023-01-05 ENCOUNTER — Encounter (HOSPITAL_COMMUNITY)
Admission: RE | Admit: 2023-01-05 | Discharge: 2023-01-05 | Disposition: A | Discharge status: Home / Self Care | Payer: Medicare HMO | Source: Ambulatory Visit | Attending: Interventional Cardiology | Admitting: Interventional Cardiology

## 2023-01-05 DIAGNOSIS — I252 Old myocardial infarction: Secondary | ICD-10-CM | POA: Diagnosis not present

## 2023-01-05 DIAGNOSIS — Z955 Presence of coronary angioplasty implant and graft: Secondary | ICD-10-CM | POA: Diagnosis not present

## 2023-01-05 DIAGNOSIS — I214 Non-ST elevation (NSTEMI) myocardial infarction: Secondary | ICD-10-CM

## 2023-01-05 DIAGNOSIS — Z48812 Encounter for surgical aftercare following surgery on the circulatory system: Secondary | ICD-10-CM | POA: Diagnosis not present

## 2023-01-07 ENCOUNTER — Encounter (HOSPITAL_COMMUNITY): Payer: Medicare HMO

## 2023-01-10 ENCOUNTER — Encounter (HOSPITAL_COMMUNITY): Admission: RE | Admit: 2023-01-10 | Payer: Medicare HMO | Source: Ambulatory Visit

## 2023-01-10 DIAGNOSIS — Z955 Presence of coronary angioplasty implant and graft: Secondary | ICD-10-CM

## 2023-01-10 DIAGNOSIS — Z48812 Encounter for surgical aftercare following surgery on the circulatory system: Secondary | ICD-10-CM | POA: Diagnosis not present

## 2023-01-10 DIAGNOSIS — I214 Non-ST elevation (NSTEMI) myocardial infarction: Secondary | ICD-10-CM | POA: Diagnosis not present

## 2023-01-10 DIAGNOSIS — I252 Old myocardial infarction: Secondary | ICD-10-CM | POA: Diagnosis not present

## 2023-01-12 ENCOUNTER — Encounter (HOSPITAL_COMMUNITY)
Admission: RE | Admit: 2023-01-12 | Discharge: 2023-01-12 | Disposition: A | Discharge status: Home / Self Care | Payer: Medicare HMO | Source: Ambulatory Visit | Attending: Interventional Cardiology | Admitting: Interventional Cardiology

## 2023-01-12 DIAGNOSIS — I252 Old myocardial infarction: Secondary | ICD-10-CM | POA: Diagnosis not present

## 2023-01-12 DIAGNOSIS — Z955 Presence of coronary angioplasty implant and graft: Secondary | ICD-10-CM

## 2023-01-12 DIAGNOSIS — I214 Non-ST elevation (NSTEMI) myocardial infarction: Secondary | ICD-10-CM

## 2023-01-12 DIAGNOSIS — Z48812 Encounter for surgical aftercare following surgery on the circulatory system: Secondary | ICD-10-CM | POA: Diagnosis not present

## 2023-01-14 ENCOUNTER — Encounter (HOSPITAL_COMMUNITY): Payer: Medicare HMO

## 2023-01-17 ENCOUNTER — Encounter (HOSPITAL_COMMUNITY)
Admission: RE | Admit: 2023-01-17 | Discharge: 2023-01-17 | Disposition: A | Discharge status: Home / Self Care | Payer: Medicare HMO | Source: Ambulatory Visit | Attending: Interventional Cardiology | Admitting: Interventional Cardiology

## 2023-01-17 DIAGNOSIS — Z955 Presence of coronary angioplasty implant and graft: Secondary | ICD-10-CM

## 2023-01-17 DIAGNOSIS — I252 Old myocardial infarction: Secondary | ICD-10-CM | POA: Diagnosis not present

## 2023-01-17 DIAGNOSIS — Z48812 Encounter for surgical aftercare following surgery on the circulatory system: Secondary | ICD-10-CM | POA: Diagnosis not present

## 2023-01-17 DIAGNOSIS — I214 Non-ST elevation (NSTEMI) myocardial infarction: Secondary | ICD-10-CM | POA: Diagnosis not present

## 2023-01-18 NOTE — Progress Notes (Signed)
Cardiac Individual Treatment Plan  Patient Details  Name: Bryan Thompson MRN: [id number] Date of Birth: 1950-06-18 Referring Provider:   Flowsheet Row INTENSIVE CARDIAC REHAB ORIENT from 11/16/2022 in [facility]  Referring Provider Corky Crafts, MD       Initial Encounter Date:  Flowsheet Row INTENSIVE CARDIAC REHAB ORIENT from 11/16/2022 in [facility]  Date 11/16/22       Visit Diagnosis: 09/29/22 NSTEMI (non-ST elevated myocardial infarction) (HCC)  09/29/22 DES Cfx  Patient's Home Medications on Admission:  Current Outpatient Medications:    acetaminophen (TYLENOL) 500 MG tablet, Take 1,000 mg by mouth every 8 (eight) hours as needed for moderate pain, fever or headache., Disp: , Rfl:    aspirin EC 81 MG tablet, Take 1 tablet (81 mg total) by mouth daily. Swallow whole., Disp: 30 tablet, Rfl: 12   Evolocumab (REPATHA SURECLICK) 140 MG/ML SOAJ, Inject 140 mg into the skin every 14 (fourteen) days., Disp: 6 mL, Rfl: 3   levothyroxine (SYNTHROID) 137 MCG tablet, Take 137 mcg by mouth daily before breakfast., Disp: , Rfl:    metoprolol succinate (TOPROL-XL) 25 MG 24 hr tablet, Take 0.5 tablets (12.5 mg total) by mouth daily., Disp: 30 tablet, Rfl: 3   Multiple Vitamins-Minerals (CENTRUM SILVER PO), Take 1 tablet by mouth daily in the afternoon., Disp: , Rfl:    nitroGLYCERIN (NITROSTAT) 0.4 MG SL tablet, Place 1 tablet (0.4 mg total) under the tongue every 5 (five) minutes as needed for chest pain., Disp: 25 tablet, Rfl: 3   prasugrel (EFFIENT) 10 MG TABS tablet, Take 1 tablet (10 mg total) by mouth daily., Disp: 30 tablet, Rfl: 3   sertraline (ZOLOFT) 100 MG tablet, Take 100 mg by mouth daily in the afternoon., Disp: , Rfl:    vitamin B-12 (CYANOCOBALAMIN) 1000 MCG tablet, Take 1,000 mcg by mouth daily in the afternoon., Disp: , Rfl:   Past Medical  History: Past Medical History:  Diagnosis Date   COVID-19    Depression    Hernia, umbilical    Hyperlipidemia    Hypertension    Thyroid disease     Tobacco Use: Social History   Tobacco Use  Smoking Status Never  Smokeless Tobacco Never    Labs: Review Flowsheet       Latest Ref Rng & Units 09/29/2022 12/16/2022  Labs for ITP Cardiac and Pulmonary Rehab  Cholestrol 100 - 199 mg/dL 782  956   LDL (calc) 0 - 99 mg/dL 80  213   HDL-C >08 mg/dL 29  29   Trlycerides 0 - 149 mg/dL 657  846   Hemoglobin N6E 4.8 - 5.6 % 6.3  -    Details            Capillary Blood Glucose: No results found for: "GLUCAP"   Exercise Target Goals: Exercise Program Goal: Individual exercise prescription set using results from initial 6 min walk test and THRR while considering  patient's activity barriers and safety.   Exercise Prescription Goal: Initial exercise prescription builds to 30-45 minutes a day of aerobic activity, 2-3 days per week.  Home exercise guidelines will be given to patient during program as part of exercise prescription that the participant will acknowledge.  Activity Barriers & Risk Stratification:  Activity Barriers & Cardiac Risk Stratification - 11/16/22 1340       Activity Barriers & Cardiac Risk Stratification  Activity Barriers None    Cardiac Risk Stratification Moderate             6 Minute Walk:  6 Minute Walk     Row Name 11/16/22 1352         6 Minute Walk   Phase Initial     Distance 1238 feet     Walk Time 6 minutes     # of Rest Breaks 0     MPH 2.34     METS 2.28     RPE 11     Perceived Dyspnea  0     VO2 Peak 7.99     Symptoms No     Resting HR 68 bpm     Resting BP 106/72     Resting Oxygen Saturation  97 %     Exercise Oxygen Saturation  during 6 min walk 98 %     Max Ex. HR 80 bpm     Max Ex. BP 120/72     2 Minute Post BP 120/70              Oxygen Initial Assessment:   Oxygen Re-Evaluation:   Oxygen  Discharge (Final Oxygen Re-Evaluation):   Initial Exercise Prescription:  Initial Exercise Prescription - 11/16/22 1500       Date of Initial Exercise RX and Referring Provider   Date 11/16/22    Referring Provider Corky Crafts, MD    Expected Discharge Date 01/28/23      Recumbant Bike   Level 1    Minutes 15    METs 2.3      NuStep   Level 2    SPM 85    Minutes 15    METs 2.3      Prescription Details   Frequency (times per week) 3    Duration Progress to 30 minutes of continuous aerobic without signs/symptoms of physical distress      Intensity   THRR 40-80% of Max Heartrate 59-118    Ratings of Perceived Exertion 11-13    Perceived Dyspnea 0-4      Progression   Progression Continue to progress workloads to maintain intensity without signs/symptoms of physical distress.      Resistance Training   Training Prescription Yes    Weight 3 lbs    Reps 10-15             Perform Capillary Blood Glucose checks as needed.  Exercise Prescription Changes:   Exercise Prescription Changes     Row Name 11/22/22 1629 12/06/22 1646 12/27/22 1643 01/10/23 1636       Response to Exercise   Blood Pressure (Admit) 124/78 112/60 136/72 108/58    Blood Pressure (Exercise) 138/72 128/68 118/60 128/60    Blood Pressure (Exit) 146/86 122/70 112/78 106/64    Heart Rate (Admit) 67 bpm 72 bpm 64 bpm 69 bpm    Heart Rate (Exercise) 83 bpm 99 bpm 102 bpm 101 bpm    Heart Rate (Exit) 75 bpm 80 bpm 73 bpm 77 bpm    Rating of Perceived Exertion (Exercise) 11 12.5 12 11.5    Perceived Dyspnea (Exercise) 0 0 0 0    Symptoms 0 0 0 0    Comments Pt first day in the [employer] program Reviewed MET's, goals and home ExRx REVD MET's and goals REVD MET's    Duration Progress to 30 minutes of  aerobic without signs/symptoms of physical distress Progress to 30 minutes of  aerobic without signs/symptoms of physical distress Progress to 30 minutes of  aerobic without signs/symptoms  of physical distress Progress to 30 minutes of  aerobic without signs/symptoms of physical distress    Intensity THRR unchanged THRR unchanged THRR unchanged THRR unchanged      Progression   Progression Continue to progress workloads to maintain intensity without signs/symptoms of physical distress. Continue to progress workloads to maintain intensity without signs/symptoms of physical distress. Continue to progress workloads to maintain intensity without signs/symptoms of physical distress. Continue to progress workloads to maintain intensity without signs/symptoms of physical distress.    Average METs 2.25 3.4 3.4 3.8      Resistance Training   Training Prescription Yes Yes Yes Yes    Weight 3 lbs 3 lbs 3 lbs 3 lbs    Reps 10-15 10-15 10-15 10-15    Time 10 Minutes 10 Minutes 10 Minutes 10 Minutes      Recumbant Bike   Level 1 3 3 4     RPM 71 89 83 58    Watts 21 52 50 93    Minutes 15 15 15 15     METs 2.2 4 3.5 3.8      NuStep   Level 2 3 3 3     SPM 98 115 125 132    Minutes 15 15 15 15     METs 2.3 2.8 3.3 3.8      Home Exercise Plan   Plans to continue exercise at -- Home (comment) Home (comment) Home (comment)    Frequency -- Add 3 additional days to program exercise sessions. Add 3 additional days to program exercise sessions. Add 3 additional days to program exercise sessions.    Initial Home Exercises Provided -- 12/06/22 12/06/22 12/06/22             Exercise Comments:   Exercise Comments     Row Name 11/22/22 1634 12/06/22 1654 12/27/22 1647 01/10/23 1638     Exercise Comments Pt first day in the [facility] program. Pt tolerated exercise well with an average MET level of 2.25. Pt is off to a good start and is learning his THRR, RPE and ExRx Reviewed MET's, goals and home ExRx. Pt tolerated exercise well with an average MET level of 3.4. Pt feels good about his goal of gaining stamina, he is already meeting ACSM guidelines by walking 2 miles (30 mins) on the  days he doesnt come to [employer]. Reviewed MET's and goals. Pt tolerated exercise well with an average MET level of 3.4. Pt has been consistent with his walking and he feels an increase in his stamina. He had a very good cardiology appt today and is excited about decreasing some of his meds. Reviewed MET's. Pt tolerated exercise well with an average MET level of 3.8. Pt feels good about the progress he's making             Exercise Goals and Review:   Exercise Goals     Row Name 11/16/22 1418             Exercise Goals   Increase Physical Activity Yes       Intervention Provide advice, education, support and counseling about physical activity/exercise needs.;Develop an individualized exercise prescription for aerobic and resistive training based on initial evaluation findings, risk stratification, comorbidities and participant's personal goals.       Expected Outcomes Short Term: Attend rehab on a regular basis to increase amount of physical activity.;Long Term: Exercising regularly at  least 3-5 days a week.;Long Term: Add in home exercise to make exercise part of routine and to increase amount of physical activity.       Increase Strength and Stamina Yes       Intervention Provide advice, education, support and counseling about physical activity/exercise needs.;Develop an individualized exercise prescription for aerobic and resistive training based on initial evaluation findings, risk stratification, comorbidities and participant's personal goals.       Expected Outcomes Short Term: Increase workloads from initial exercise prescription for resistance, speed, and METs.;Short Term: Perform resistance training exercises routinely during rehab and add in resistance training at home;Long Term: Improve cardiorespiratory fitness, muscular endurance and strength as measured by increased METs and functional capacity ( )       Able to understand and use rate of perceived exertion (RPE) scale Yes        Intervention Provide education and explanation on how to use RPE scale       Expected Outcomes Short Term: Able to use RPE daily in rehab to express subjective intensity level;Long Term:  Able to use RPE to guide intensity level when exercising independently       Knowledge and understanding of Target Heart Rate Range (THRR) Yes       Intervention Provide education and explanation of THRR including how the numbers were predicted and where they are located for reference       Expected Outcomes Short Term: Able to state/look up THRR;Short Term: Able to use daily as guideline for intensity in rehab;Long Term: Able to use THRR to govern intensity when exercising independently       Able to check pulse independently Yes       Intervention Provide education and demonstration on how to check pulse in carotid and radial arteries.;Review the importance of being able to check your own pulse for safety during independent exercise       Expected Outcomes Short Term: Able to explain why pulse checking is important during independent exercise;Long Term: Able to check pulse independently and accurately       Understanding of Exercise Prescription Yes       Intervention Provide education, explanation, and written materials on patient's individual exercise prescription       Expected Outcomes Short Term: Able to explain program exercise prescription;Long Term: Able to explain home exercise prescription to exercise independently                Exercise Goals Re-Evaluation :  Exercise Goals Re-Evaluation     Row Name 11/22/22 1632 12/06/22 1650 12/27/22 1646         Exercise Goal Re-Evaluation   Exercise Goals Review Increase Physical Activity;Understanding of Exercise Prescription;Increase Strength and Stamina;Knowledge and understanding of Target Heart Rate Range (THRR);Able to understand and use rate of perceived exertion (RPE) scale Increase Physical Activity;Understanding of Exercise  Prescription;Increase Strength and Stamina;Knowledge and understanding of Target Heart Rate Range (THRR);Able to understand and use rate of perceived exertion (RPE) scale Increase Physical Activity;Understanding of Exercise Prescription;Increase Strength and Stamina;Knowledge and understanding of Target Heart Rate Range (THRR);Able to understand and use rate of perceived exertion (RPE) scale     Comments Pt first day in the [facility] program. Pt tolerated exercise well with an average MET level of 2.25. Pt is off to a good start and is learning his THRR, RPE and ExRx Reviewed MEt's, goals and home ExRx. Pt tolerated exercise well with an average MET level of 3.4. Pt feels good about  his goal of gaining stamina, he is already meeting ACSM guidelines by walking 2 miles (30 mins) on the days he doesnt come to [employer]. Reviewed MET's and goals. Pt tolerated exercise well with an average MET level of 3.4. Pt has been consistent with his walking and he feels an increase in his stamina. He had a very good cardiology appt today and is excited about decreasing some of his meds.     Expected Outcomes Will continue to monitor pt and progress workloads as tolerated without sign or symptom Will continue to monitor pt and progress workloads as tolerated without sign or symptom Will continue to monitor pt and progress workloads as tolerated without sign or symptom              Discharge Exercise Prescription (Final Exercise Prescription Changes):  Exercise Prescription Changes - 01/10/23 1636       Response to Exercise   Blood Pressure (Admit) 108/58    Blood Pressure (Exercise) 128/60    Blood Pressure (Exit) 106/64    Heart Rate (Admit) 69 bpm    Heart Rate (Exercise) 101 bpm    Heart Rate (Exit) 77 bpm    Rating of Perceived Exertion (Exercise) 11.5    Perceived Dyspnea (Exercise) 0    Symptoms 0    Comments REVD MET's    Duration Progress to 30 minutes of  aerobic without signs/symptoms of physical  distress    Intensity THRR unchanged      Progression   Progression Continue to progress workloads to maintain intensity without signs/symptoms of physical distress.    Average METs 3.8      Resistance Training   Training Prescription Yes    Weight 3 lbs    Reps 10-15    Time 10 Minutes      Recumbant Bike   Level 4    RPM 58    Watts 93    Minutes 15    METs 3.8      NuStep   Level 3    SPM 132    Minutes 15    METs 3.8      Home Exercise Plan   Plans to continue exercise at Home (comment)    Frequency Add 3 additional days to program exercise sessions.    Initial Home Exercises Provided 12/06/22             Nutrition:  Target Goals: Understanding of nutrition guidelines, daily intake of sodium 1500mg , cholesterol 200mg , calories 30% from fat and 7% or less from saturated fats, daily to have 5 or more servings of fruits and vegetables.  Biometrics:  Pre Biometrics - 11/16/22 1300       Pre Biometrics   Waist Circumference 43.5 inches    Hip Circumference 42 inches    Waist to Hip Ratio 1.04 %    Triceps Skinfold 22 mm    % Body Fat 31.4 %    Grip Strength 30 kg    Flexibility 0 in    Single Leg Stand 9.68 seconds              Nutrition Therapy Plan and Nutrition Goals:  Nutrition Therapy & Goals - 12/24/22 1622       Nutrition Therapy   Diet Heart Healthy Diet      Personal Nutrition Goals   Nutrition Goal Patient to identify strategies for reducing cardiovascular risk by attending the [facility] education and nutrition series weekly.    Personal Goal #2 Patient to  improve diet quality by using the plate method as a guide for meal planning to include lean protein/plant protein, fruits, vegetables, whole grains, nonfat dairy as part of a well-balanced diet.    Personal Goal #3 Patient to reduce sodium to 1500mg  per day    Comments Goals in progress. Karl continues to attend the [employer] and nutrition series two days per week. He and  his wife have started making some changes including increased vegetables, decreased eating out, decreased saturated fat intake. They do continue to be more liberal in sodium intake per recommendations of PCP related to history of low sodium. He has declined statin therapy but did start Repatha on 8/1 as LDL cholesterol remains >100. Patient will benefit from participation in intensive cardiac rehab for nutrition, exercise, and lifestyle modification.      Intervention Plan   Intervention Prescribe, educate and counsel regarding individualized specific dietary modifications aiming towards targeted core components such as weight, hypertension, lipid management, diabetes, heart failure and other comorbidities.;Nutrition handout(s) given to patient.    Expected Outcomes Short Term Goal: Understand basic principles of dietary content, such as calories, fat, sodium, cholesterol and nutrients.;Long Term Goal: Adherence to prescribed nutrition plan.             Nutrition Assessments:  Nutrition Assessments - 12/06/22 1112       Rate Your Plate Scores   Pre Score 53            MEDIFICTS Score Key: ?70 Need to make dietary changes  40-70 Heart Healthy Diet ? 40 Therapeutic Level Cholesterol Diet   Flowsheet Row INTENSIVE CARDIAC REHAB from 12/01/2022 in [facility]  Picture Your Plate Total Score on Admission 53      Picture Your Plate Scores: <96 Unhealthy dietary pattern with much room for improvement. 41-50 Dietary pattern unlikely to meet recommendations for good health and room for improvement. 51-60 More healthful dietary pattern, with some room for improvement.  >60 Healthy dietary pattern, although there may be some specific behaviors that could be improved.    Nutrition Goals Re-Evaluation:  Nutrition Goals Re-Evaluation     Row Name 11/23/22 1455 12/24/22 1622           Goals   Current Weight 208 lb 15.9 oz  (94.8 kg) 209 lb 7 oz (95 kg)      Comment A1c 6.3, LDL 80, HDL 29, LipoproteinA WNL, triglycerides 173, LDL 121, HDL 29; other most recent labs A1c 6.3, lipoprotein A WNL      Expected Outcome Patient will benefit from participation in intensive cardiac rehab for nutrition, exercise, and lifestyle modification. Goals in progress. Lynnox continues to attend the [employer] and nutrition series two days per week. He and his wife have started making some changes including increased vegetables, decreased eating out, decreased saturated fat intake. They do continue to be more liberal in sodium intake per recommendations of PCP related to history of low sodium. He has declined statin therapy but did start Repatha on 8/1 as LDL cholesterol remains >100. Patient will benefit from participation in intensive cardiac rehab for nutrition, exercise, and lifestyle modification.               Nutrition Goals Re-Evaluation:  Nutrition Goals Re-Evaluation     Row Name 11/23/22 1455 12/24/22 1622           Goals   Current Weight 208 lb 15.9 oz (94.8 kg) 209 lb  7 oz (95 kg)      Comment A1c 6.3, LDL 80, HDL 29, LipoproteinA WNL, triglycerides 173, LDL 121, HDL 29; other most recent labs A1c 6.3, lipoprotein A WNL      Expected Outcome Patient will benefit from participation in intensive cardiac rehab for nutrition, exercise, and lifestyle modification. Goals in progress. Dora continues to attend the [employer] and nutrition series two days per week. He and his wife have started making some changes including increased vegetables, decreased eating out, decreased saturated fat intake. They do continue to be more liberal in sodium intake per recommendations of PCP related to history of low sodium. He has declined statin therapy but did start Repatha on 8/1 as LDL cholesterol remains >100. Patient will benefit from participation in intensive cardiac rehab for nutrition, exercise, and lifestyle  modification.               Nutrition Goals Discharge (Final Nutrition Goals Re-Evaluation):  Nutrition Goals Re-Evaluation - 12/24/22 1622       Goals   Current Weight 209 lb 7 oz (95 kg)    Comment triglycerides 173, LDL 121, HDL 29; other most recent labs A1c 6.3, lipoprotein A WNL    Expected Outcome Goals in progress. Derral continues to attend the [employer] and nutrition series two days per week. He and his wife have started making some changes including increased vegetables, decreased eating out, decreased saturated fat intake. They do continue to be more liberal in sodium intake per recommendations of PCP related to history of low sodium. He has declined statin therapy but did start Repatha on 8/1 as LDL cholesterol remains >100. Patient will benefit from participation in intensive cardiac rehab for nutrition, exercise, and lifestyle modification.             Psychosocial: Target Goals: Acknowledge presence or absence of significant depression and/or stress, maximize coping skills, provide positive support system. Participant is able to verbalize types and ability to use techniques and skills needed for reducing stress and depression.  Initial Review & Psychosocial Screening:  Initial Psych Review & Screening - 11/16/22 1420       Initial Review   Current issues with Current Depression;Current Stress Concerns    Source of Stress Concerns Chronic Illness    Comments Alexio admits to being depressed due to his recent hospitalization.      Family Dynamics   Good Support System? Yes   Jaylen has his wife, son and friends for support     Barriers   Psychosocial barriers to participate in program The patient should benefit from training in stress management and relaxation.      Screening Interventions   Interventions Encouraged to exercise;Provide feedback about the scores to participant    Expected Outcomes Long Term Goal: Stressors or current issues are controlled or  eliminated.;Short Term goal: Identification and review with participant of any Quality of Life or Depression concerns found by scoring the questionnaire.             Quality of Life Scores:  Quality of Life - 11/16/22 1521       Quality of Life   Select Quality of Life      Quality of Life Scores   Health/Function Pre 22.86 %    Socioeconomic Pre 30 %    Psych/Spiritual Pre 25.07 %    Family Pre 28.8 %    GLOBAL Pre 25.74 %  Scores of 19 and below usually indicate a poorer quality of life in these areas.  A difference of  2-3 points is a clinically meaningful difference.  A difference of 2-3 points in the total score of the Quality of Life Index has been associated with significant improvement in overall quality of life, self-image, physical symptoms, and general health in studies assessing change in quality of life.  PHQ-9: Review Flowsheet       11/16/2022  Depression screen PHQ 2/9  Decreased Interest 0  Down, Depressed, Hopeless 1  PHQ - 2 Score 1  Altered sleeping 0  Tired, decreased energy 1  Change in appetite 0  Feeling bad or failure about yourself  1  Trouble concentrating 0  Moving slowly or fidgety/restless 1  Suicidal thoughts 0  PHQ-9 Score 4  Difficult doing work/chores Somewhat difficult    Details           Interpretation of Total Score  Total Score Depression Severity:  1-4 = Minimal depression, 5-9 = Mild depression, 10-14 = Moderate depression, 15-19 = Moderately severe depression, 20-27 = Severe depression   Psychosocial Evaluation and Intervention:   Psychosocial Re-Evaluation:  Psychosocial Re-Evaluation     Row Name 12/22/22 1503 01/18/23 0743           Psychosocial Re-Evaluation   Current issues with Current Stress Concerns;History of Depression Current Stress Concerns;History of Depression      Comments Jeramih has not voiced any increased concerns or stressors during exercise at cardiac rehab Reda has not voiced  any increased concerns or stressors during exercise at cardiac rehab. Hasib will complete cardiac rehab on 01/26/23      Expected Outcomes Rosaire will have decreased stress and depression upon completion of cardiac rehab Daimen states he has had ongoing depression for years. Avraham says his depression is controlled at this time. Oryon continues to take his antidepressant.      Interventions Stress management education;Encouraged to attend Cardiac Rehabilitation for the exercise;Relaxation education Stress management education;Encouraged to attend Cardiac Rehabilitation for the exercise;Relaxation education        Initial Review   Source of Stress Concerns Chronic Illness;Unable to perform yard/household activities Chronic Illness;Unable to perform yard/household activities      Comments Will continue to monitor and offer support as neded Will continue to monitor and offer support as neded               Psychosocial Discharge (Final Psychosocial Re-Evaluation):  Psychosocial Re-Evaluation - 01/18/23 0743       Psychosocial Re-Evaluation   Current issues with Current Stress Concerns;History of Depression    Comments Lemond has not voiced any increased concerns or stressors during exercise at cardiac rehab. Si will complete cardiac rehab on 01/26/23    Expected Outcomes Omarion states he has had ongoing depression for years. Rykar says his depression is controlled at this time. Victorino continues to take his antidepressant.    Interventions Stress management education;Encouraged to attend Cardiac Rehabilitation for the exercise;Relaxation education      Initial Review   Source of Stress Concerns Chronic Illness;Unable to perform yard/household activities    Comments Will continue to monitor and offer support as neded             Vocational Rehabilitation: Provide vocational rehab assistance to qualifying candidates.   Vocational Rehab Evaluation & Intervention:  Vocational Rehab - 11/16/22 1423        Initial Vocational Rehab Evaluation & Intervention   Assessment shows  need for Vocational Rehabilitation No   Shyon is retired and does not need vocational rehab at this time            Education: Education Goals: Education classes will be provided on a weekly basis, covering required topics. Participant will state understanding/return demonstration of topics presented.    Education     Row Name 11/22/22 1500     Education   Cardiac Education Topics Pritikin   Licensed conveyancer Nutrition   Nutrition Calorie Density   Instruction Review Code 1- Verbalizes Understanding   Class Start Time 1400   Class Stop Time 1445   Class Time Calculation (min) 45 min    Row Name 11/24/22 1600     Education   Cardiac Education Topics Pritikin   Customer service manager   Weekly Topic Efficiency Cooking - Meals in a Snap   Instruction Review Code 1- Verbalizes Understanding   Class Start Time 1400   Class Stop Time 1445   Class Time Calculation (min) 45 min    Row Name 11/29/22 1500     Education   Cardiac Education Topics Pritikin   Immunologist Exercise Physiologist   [facility] Psychosocial   Psychosocial Workshop Focused Goals, Sustainable Changes   Instruction Review Code 1- Verbalizes Understanding   Class Start Time 1355   Class Stop Time 1437   Class Time Calculation (min) 42 min    Row Name 12/01/22 1600     Education   Cardiac Education Topics Pritikin   Medical laboratory scientific officer   Weekly Topic One-Pot Wonders   Instruction Review Code 1- Verbalizes Understanding   Class Start Time 1400   Class Stop Time 1440   Class Time Calculation (min) 40 min    Row Name 12/06/22 1600     Education   Cardiac Education Topics Pritikin   Psychologist, forensic  Exercise Education   Exercise Education Biomechanial Limitations   Instruction Review Code 1- Verbalizes Understanding   Class Start Time 1406   Class Stop Time 1449   Class Time Calculation (min) 43 min    Row Name 12/08/22 1600     Education   Cardiac Education Topics Pritikin   Customer service manager   Weekly Topic Comforting Weekend Breakfasts   Instruction Review Code 1- Verbalizes Understanding   Class Start Time 1400   Class Stop Time 1445   Class Time Calculation (min) 45 min    Row Name 12/13/22 1500     Education   Cardiac Education Topics Pritikin   Licensed conveyancer Nutrition   Nutrition Facts on Fat   Instruction Review Code 1- Verbalizes Understanding   Class Start Time 1400   Class Stop Time 1440   Class Time Calculation (min) 40 min    Row Name 12/15/22 1500     Education   Cardiac Education Topics Pritikin   Customer service manager   Weekly Topic Fast Evening Meals   Instruction Review Code 1- [employer] Understanding  Class Start Time 1400   Class Stop Time 1440   Class Time Calculation (min) 40 min    Row Name 12/20/22 1600     Education   Cardiac Education Topics Pritikin   Geographical information systems officer Psychosocial   Psychosocial Workshop Healthy Sleep for a Healthy Heart   Instruction Review Code 1- Verbalizes Understanding   Class Start Time 1400   Class Stop Time 1455   Class Time Calculation (min) 55 min    Row Name 12/22/22 1600     Education   Cardiac Education Topics Pritikin   Customer service manager   Weekly Topic International Cuisine- Spotlight on the [employer] Zones   Instruction Review Code 1- Verbalizes Understanding   Class Start Time 1400   Class Stop Time 1448   Class Time Calculation (min) 48 min    Row  Name 12/27/22 1500     Education   Cardiac Education Topics Pritikin   Glass blower/designer Nutrition   Nutrition Workshop Fueling a Forensic psychologist   Instruction Review Code 1- Tax inspector   Class Start Time 1400   Class Stop Time 1452   Class Time Calculation (min) 52 min    Row Name 12/29/22 1500     Education   Cardiac Education Topics Pritikin   Customer service manager   Weekly Topic Simple Sides and Sauces   Instruction Review Code 1- Verbalizes Understanding   Class Start Time 1400   Class Stop Time 1445   Class Time Calculation (min) 45 min    Row Name 01/03/23 1500     Education   Cardiac Education Topics Pritikin   [employer] Workshops     Workshops   Educator Exercise Physiologist   Select Exercise   Exercise Workshop Managing Heart Disease: Your Path to a Healthier Heart   Instruction Review Code 1- Verbalizes Understanding   Class Start Time 1415   Class Stop Time 1510   Class Time Calculation (min) 55 min    Row Name 01/05/23 1500     Education   Cardiac Education Topics Pritikin   Customer service manager   Weekly Topic Powerhouse Plant-Based Proteins   Instruction Review Code 1- Verbalizes Understanding   Class Start Time 1400   Class Stop Time 1440   Class Time Calculation (min) 40 min    Row Name 01/10/23 1500     Education   Cardiac Education Topics Pritikin   Geographical information systems officer Psychosocial   Psychosocial Workshop From Head to Heart: The Power of a Healthy Outlook   Instruction Review Code 1- Verbalizes Understanding   Class Start Time 1400   Class Stop Time 1446   Class Time Calculation (min) 46 min    Row Name 01/12/23 1500     Education   Cardiac Education Topics Pritikin   Customer service manager    Weekly Topic Adding Flavor - Sodium-Free   Instruction Review Code 1- Verbalizes Understanding   Class Start Time 1400   Class Stop Time 1437   Class Time Calculation (min) 37 min  Row Name 01/17/23 1600     Education   Cardiac Education Topics Pritikin   Licensed conveyancer Nutrition   Nutrition Overview of the Pritikin Eating Plan   Instruction Review Code 1- Verbalizes Understanding   Class Start Time 1400   Class Stop Time 1452   Class Time Calculation (min) 52 min            Core Videos: Exercise    Move It!  Clinical staff conducted group or individual video education with verbal and written material and guidebook.  Patient learns the recommended Pritikin exercise program. Exercise with the goal of living a long, healthy life. Some of the health benefits of exercise include controlled diabetes, healthier blood pressure levels, improved cholesterol levels, improved heart and lung capacity, improved sleep, and better body composition. Everyone should speak with their doctor before starting or changing an exercise routine.  Biomechanical Limitations Clinical staff conducted group or individual video education with verbal and written material and guidebook.  Patient learns how biomechanical limitations can impact exercise and how we can mitigate and possibly overcome limitations to have an impactful and balanced exercise routine.  Body Composition Clinical staff conducted group or individual video education with verbal and written material and guidebook.  Patient learns that body composition (ratio of muscle mass to fat mass) is a key component to assessing overall fitness, rather than body weight alone. Increased fat mass, especially visceral belly fat, can put [place] at increased risk for metabolic syndrome, type 2 diabetes, heart disease, and even death. It is recommended to combine diet and exercise (cardiovascular and resistance  training) to improve your body composition. Seek guidance from your physician and exercise physiologist before implementing an exercise routine.  Exercise Action Plan Clinical staff conducted group or individual video education with verbal and written material and guidebook.  Patient learns the recommended strategies to achieve and enjoy long-term exercise adherence, including variety, self-motivation, self-efficacy, and positive decision making. Benefits of exercise include fitness, good health, weight management, more energy, better sleep, less stress, and overall well-being.  Medical   Heart Disease Risk Reduction Clinical staff conducted group or individual video education with verbal and written material and guidebook.  Patient learns our heart is our most vital organ as it circulates oxygen, nutrients, white blood cells, and hormones throughout the entire body, and carries waste away. Data supports a plant-based eating plan like the Pritikin Program for its effectiveness in slowing progression of and reversing heart disease. The video provides a number of recommendations to address heart disease.   Metabolic Syndrome and Belly Fat  Clinical staff conducted group or individual video education with verbal and written material and guidebook.  Patient learns what metabolic syndrome is, how it leads to heart disease, and how one can reverse it and keep it from coming back. You have metabolic syndrome if you have 3 of the following 5 criteria: abdominal obesity, high blood pressure, high triglycerides, low HDL cholesterol, and high blood sugar.  Hypertension and Heart Disease Clinical staff conducted group or individual video education with verbal and written material and guidebook.  Patient learns that high blood pressure, or hypertension, is very common in the [place]. Hypertension is largely due to excessive salt intake, but other important risk factors include being overweight, physical  inactivity, drinking too much alcohol, smoking, and not eating enough potassium from fruits and vegetables. High blood pressure is a leading risk  factor for heart attack, stroke, congestive heart failure, dementia, kidney failure, and premature death. Long-term effects of excessive salt intake include stiffening of the arteries and thickening of heart muscle and organ damage. Recommendations include ways to reduce hypertension and the risk of heart disease.  Diseases of Our Time - Focusing on Diabetes Clinical staff conducted group or individual video education with verbal and written material and guidebook.  Patient learns why the best way to stop diseases of our time is prevention, through food and other lifestyle changes. Medicine (such as prescription pills and surgeries) is often only a Band-Aid on the problem, not a long-term solution. Most common diseases of our time include obesity, type 2 diabetes, hypertension, heart disease, and cancer. The Pritikin Program is recommended and has been proven to help reduce, reverse, and/or prevent the damaging effects of metabolic syndrome.  Nutrition   Overview of the Pritikin Eating Plan  Clinical staff conducted group or individual video education with verbal and written material and guidebook.  Patient learns about the Pritikin Eating Plan for disease risk reduction. The Pritikin Eating Plan emphasizes a wide variety of unrefined, minimally-processed carbohydrates, like fruits, vegetables, whole grains, and legumes. Go, Caution, and Stop food choices are explained. Plant-based and lean animal proteins are emphasized. Rationale provided for low sodium intake for blood pressure control, low added sugars for blood sugar stabilization, and low added fats and oils for coronary artery disease risk reduction and weight management.  Calorie Density  Clinical staff conducted group or individual video education with verbal and written material and guidebook.   Patient learns about calorie density and how it impacts the Pritikin Eating Plan. Knowing the characteristics of the food you choose will help you decide whether those foods will lead to weight gain or weight loss, and whether you want to consume more or less of them. Weight loss is usually a side effect of the Pritikin Eating Plan because of its focus on low calorie-dense foods.  Label Reading  Clinical staff conducted group or individual video education with verbal and written material and guidebook.  Patient learns about the Pritikin recommended label reading guidelines and corresponding recommendations regarding calorie density, added sugars, sodium content, and whole grains.  Dining Out - Part 1  Clinical staff conducted group or individual video education with verbal and written material and guidebook.  Patient learns that restaurant meals can be sabotaging because they can be so high in calories, fat, sodium, and/or sugar. Patient learns recommended strategies on how to positively address this and avoid unhealthy pitfalls.  Facts on Fats  Clinical staff conducted group or individual video education with verbal and written material and guidebook.  Patient learns that lifestyle modifications can be just as effective, if not more so, as many medications for lowering your risk of heart disease. A Pritikin lifestyle can help to reduce your risk of inflammation and atherosclerosis (cholesterol build-up, or plaque, in the artery walls). Lifestyle interventions such as dietary choices and physical activity address the cause of atherosclerosis. A review of the types of fats and their impact on blood cholesterol levels, along with dietary recommendations to reduce fat intake is also included.  Nutrition Action Plan  Clinical staff conducted group or individual video education with verbal and written material and guidebook.  Patient learns how to incorporate Pritikin recommendations into their  lifestyle. Recommendations include planning and keeping personal health goals in mind as an important part of their success.  Healthy Mind-Set    Healthy Minds, Bodies,  Hearts  Clinical staff conducted group or individual video education with verbal and written material and guidebook.  Patient learns how to identify when they are stressed. Video will discuss the impact of that stress, as well as the many benefits of stress management. Patient will also be introduced to stress management techniques. The way we think, act, and feel has an impact on our hearts.  How Our Thoughts Can Heal Our Hearts  Clinical staff conducted group or individual video education with verbal and written material and guidebook.  Patient learns that negative thoughts can cause depression and anxiety. This can result in negative lifestyle behavior and serious health problems. Cognitive behavioral therapy is an effective method to help control our thoughts in order to change and improve our emotional outlook.  Additional Videos:  Exercise    Improving Performance  Clinical staff conducted group or individual video education with verbal and written material and guidebook.  Patient learns to use a non-linear approach by alternating intensity levels and lengths of time spent exercising to help burn more calories and lose more body fat. Cardiovascular exercise helps improve heart health, metabolism, hormonal balance, blood sugar control, and recovery from fatigue. Resistance training improves strength, endurance, balance, coordination, reaction time, metabolism, and muscle mass. Flexibility exercise improves circulation, posture, and balance. Seek guidance from your physician and exercise physiologist before implementing an exercise routine and learn your capabilities and proper form for all exercise.  Introduction to Yoga  Clinical staff conducted group or individual video education with verbal and written material and  guidebook.  Patient learns about yoga, a discipline of the coming together of mind, breath, and body. The benefits of yoga include improved flexibility, improved range of motion, better posture and core strength, increased lung function, weight loss, and positive self-image. Yoga's heart health benefits include lowered blood pressure, healthier heart rate, decreased cholesterol and triglyceride levels, improved immune function, and reduced stress. Seek guidance from your physician and exercise physiologist before implementing an exercise routine and learn your capabilities and proper form for all exercise.  Medical   Aging: Enhancing Your Quality of Life  Clinical staff conducted group or individual video education with verbal and written material and guidebook.  Patient learns key strategies and recommendations to stay in good physical health and enhance quality of life, such as prevention strategies, having an advocate, securing a Health Care Proxy and Power of Attorney, and keeping a list of medications and system for tracking them. It also discusses how to avoid risk for bone loss.  Biology of Weight Control  Clinical staff conducted group or individual video education with verbal and written material and guidebook.  Patient learns that weight gain occurs because we consume more calories than we burn (eating more, moving less). Even if your body weight is normal, you may have higher ratios of fat compared to muscle mass. Too much body fat puts you at increased risk for cardiovascular disease, heart attack, stroke, type 2 diabetes, and obesity-related cancers. In addition to exercise, following the Pritikin Eating Plan can help reduce your risk.  Decoding Lab Results  Clinical staff conducted group or individual video education with verbal and written material and guidebook.  Patient learns that lab test reflects one measurement whose values change over time and are influenced by many factors,  including medication, stress, sleep, exercise, food, hydration, pre-existing medical conditions, and more. It is recommended to use the knowledge from this video to become more involved with your lab results and evaluate your  numbers to speak with your doctor.   Diseases of Our Time - Overview  Clinical staff conducted group or individual video education with verbal and written material and guidebook.  Patient learns that according to the CDC, 50% to 70% of chronic diseases (such as obesity, type 2 diabetes, elevated lipids, hypertension, and heart disease) are avoidable through lifestyle improvements including healthier food choices, listening to satiety cues, and increased physical activity.  Sleep Disorders Clinical staff conducted group or individual video education with verbal and written material and guidebook.  Patient learns how good quality and duration of sleep are important to overall health and well-being. Patient also learns about sleep disorders and how they impact health along with recommendations to address them, including discussing with a physician.  Nutrition  Dining Out - Part 2 Clinical staff conducted group or individual video education with verbal and written material and guidebook.  Patient learns how to plan ahead and communicate in order to maximize their dining experience in a healthy and nutritious manner. Included are recommended food choices based on the type of restaurant the patient is visiting.   Fueling a Banker conducted group or individual video education with verbal and written material and guidebook.  There is a strong connection between our food choices and our health. Diseases like obesity and type 2 diabetes are very prevalent and are in large-part due to lifestyle choices. The Pritikin Eating Plan provides plenty of food and hunger-curbing satisfaction. It is easy to follow, affordable, and helps reduce health risks.  Menu Workshop   Clinical staff conducted group or individual video education with verbal and written material and guidebook.  Patient learns that restaurant meals can sabotage health goals because they are often packed with calories, fat, sodium, and sugar. Recommendations include strategies to plan ahead and to communicate with the manager, chef, or server to help order a healthier meal.  Planning Your Eating Strategy  Clinical staff conducted group or individual video education with verbal and written material and guidebook.  Patient learns about the Pritikin Eating Plan and its benefit of reducing the risk of disease. The Pritikin Eating Plan does not focus on calories. Instead, it emphasizes high-quality, nutrient-rich foods. By knowing the characteristics of the foods, we choose, we can determine their calorie density and make informed decisions.  Targeting Your Nutrition Priorities  Clinical staff conducted group or individual video education with verbal and written material and guidebook.  Patient learns that lifestyle habits have a tremendous impact on disease risk and progression. This video provides eating and physical activity recommendations based on your personal health goals, such as reducing LDL cholesterol, losing weight, preventing or controlling type 2 diabetes, and reducing high blood pressure.  Vitamins and Minerals  Clinical staff conducted group or individual video education with verbal and written material and guidebook.  Patient learns different ways to obtain key vitamins and minerals, including through a recommended healthy diet. It is important to discuss all supplements you take with your doctor.   Healthy Mind-Set    Smoking Cessation  Clinical staff conducted group or individual video education with verbal and written material and guidebook.  Patient learns that cigarette smoking and tobacco addiction pose a serious health risk which affects millions of people. Stopping smoking  will significantly reduce the risk of heart disease, lung disease, and many forms of cancer. Recommended strategies for quitting are covered, including working with your doctor to develop a successful plan.  Culinary   Becoming a  Pritikin Chef  Clinical staff conducted group or individual video education with verbal and written material and guidebook.  Patient learns that cooking at home can be healthy, cost-effective, quick, and puts them in control. Keys to cooking healthy recipes will include looking at your recipe, assessing your equipment needs, planning ahead, making it simple, choosing cost-effective seasonal ingredients, and limiting the use of added fats, salts, and sugars.  Cooking - Breakfast and Snacks  Clinical staff conducted group or individual video education with verbal and written material and guidebook.  Patient learns how important breakfast is to satiety and nutrition through the entire day. Recommendations include key foods to eat during breakfast to help stabilize blood sugar levels and to prevent overeating at meals later in the day. Planning ahead is also a key component.  Cooking - Educational psychologist conducted group or individual video education with verbal and written material and guidebook.  Patient learns eating strategies to improve overall health, including an approach to cook more at home. Recommendations include thinking of animal protein as a side on your plate rather than center stage and focusing instead on lower calorie dense options like vegetables, fruits, whole grains, and plant-based proteins, such as beans. Making sauces in large quantities to freeze for later and leaving the skin on your vegetables are also recommended to maximize your experience.  Cooking - Healthy Salads and Dressing Clinical staff conducted group or individual video education with verbal and written material and guidebook.  Patient learns that vegetables, fruits, whole  grains, and legumes are the foundations of the Pritikin Eating Plan. Recommendations include how to incorporate each of these in flavorful and healthy salads, and how to create homemade salad dressings. Proper handling of ingredients is also covered. Cooking - Soups and [employer] - Soups and Desserts Clinical staff conducted group or individual video education with verbal and written material and guidebook.  Patient learns that Pritikin soups and desserts make for easy, nutritious, and delicious snacks and meal components that are low in sodium, fat, sugar, and calorie density, while high in vitamins, minerals, and filling fiber. Recommendations include simple and healthy ideas for soups and desserts.   Overview     The Pritikin Solution Program Overview Clinical staff conducted group or individual video education with verbal and written material and guidebook.  Patient learns that the results of the Pritikin Program have been documented in more than 100 articles published in peer-reviewed journals, and the benefits include reducing risk factors for (and, in some cases, even reversing) high cholesterol, high blood pressure, type 2 diabetes, obesity, and more! An overview of the three key pillars of the Pritikin Program will be covered: eating well, doing regular exercise, and having a healthy mind-set.  WORKSHOPS  Exercise: Exercise Basics: Building Your Action Plan Clinical staff led group instruction and group discussion with PowerPoint presentation and patient guidebook. To enhance the learning environment the use of posters, models and videos may be added. At the conclusion of this workshop, patients will comprehend the difference between physical activity and exercise, as well as the benefits of incorporating both, into their routine. Patients will understand the FITT (Frequency, Intensity, Time, and Type) principle and how to use it to build an exercise action plan. In addition,  safety concerns and other considerations for exercise and cardiac rehab will be addressed by the presenter. The purpose of this lesson is to promote a comprehensive and effective weekly exercise routine in order to improve patients' overall  level of fitness.   Managing Heart Disease: Your Path to a Healthier Heart Clinical staff led group instruction and group discussion with PowerPoint presentation and patient guidebook. To enhance the learning environment the use of posters, models and videos may be added.At the conclusion of this workshop, patients will understand the anatomy and physiology of the heart. Additionally, they will understand how Pritikin's three pillars impact the risk factors, the progression, and the management of heart disease.  The purpose of this lesson is to provide a high-level overview of the heart, heart disease, and how the Pritikin lifestyle positively impacts risk factors.  Exercise Biomechanics Clinical staff led group instruction and group discussion with PowerPoint presentation and patient guidebook. To enhance the learning environment the use of posters, models and videos may be added. Patients will learn how the structural parts of their bodies function and how these functions impact their daily activities, movement, and exercise. Patients will learn how to promote a neutral spine, learn how to manage pain, and identify ways to improve their physical movement in order to promote healthy living. The purpose of this lesson is to expose patients to common physical limitations that impact physical activity. Participants will learn practical ways to adapt and manage aches and pains, and to minimize their effect on regular exercise. Patients will learn how to maintain good posture while sitting, walking, and lifting.  Balance Training and Fall Prevention  Clinical staff led group instruction and group discussion with PowerPoint presentation and patient guidebook. To  enhance the learning environment the use of posters, models and videos may be added. At the conclusion of this workshop, patients will understand the importance of their sensorimotor skills (vision, proprioception, and the vestibular system) in maintaining their ability to balance as they age. Patients will apply a variety of balancing exercises that are appropriate for their current level of function. Patients will understand the common causes for poor balance, possible solutions to these problems, and ways to modify their physical environment in order to minimize their fall risk. The purpose of this lesson is to teach patients about the importance of maintaining balance as they age and ways to minimize their risk of falling.  WORKSHOPS   Nutrition:  Fueling a Ship broker led group instruction and group discussion with PowerPoint presentation and patient guidebook. To enhance the learning environment the use of posters, models and videos may be added. Patients will review the foundational principles of the Pritikin Eating Plan and understand what constitutes a serving size in each of the food groups. Patients will also learn Pritikin-friendly foods that are better choices when away from home and review make-ahead meal and snack options. Calorie density will be reviewed and applied to three nutrition priorities: weight maintenance, weight loss, and weight gain. The purpose of this lesson is to reinforce (in a group setting) the key concepts around what patients are recommended to eat and how to apply these guidelines when away from home by planning and selecting Pritikin-friendly options. Patients will understand how calorie density may be adjusted for different weight management goals.  Mindful Eating  Clinical staff led group instruction and group discussion with PowerPoint presentation and patient guidebook. To enhance the learning environment the use of posters, models and videos may  be added. Patients will briefly review the concepts of the Pritikin Eating Plan and the importance of low-calorie dense foods. The concept of mindful eating will be introduced as well as the importance of paying attention to internal hunger signals.  Triggers for non-hunger eating and techniques for dealing with triggers will be explored. The purpose of this lesson is to provide patients with the opportunity to review the basic principles of the Pritikin Eating Plan, discuss the value of eating mindfully and how to measure internal cues of hunger and fullness using the Hunger Scale. Patients will also discuss reasons for non-hunger eating and learn strategies to use for controlling emotional eating.  Targeting Your Nutrition Priorities Clinical staff led group instruction and group discussion with PowerPoint presentation and patient guidebook. To enhance the learning environment the use of posters, models and videos may be added. Patients will learn how to determine their genetic susceptibility to disease by reviewing their family history. Patients will gain insight into the importance of diet as part of an overall healthy lifestyle in mitigating the impact of genetics and other environmental insults. The purpose of this lesson is to provide patients with the opportunity to assess their personal nutrition priorities by looking at their family history, their own health history and current risk factors. Patients will also be able to discuss ways of prioritizing and modifying the Pritikin Eating Plan for their highest risk areas  Menu  Clinical staff led group instruction and group discussion with PowerPoint presentation and patient guidebook. To enhance the learning environment the use of posters, models and videos may be added. Using menus brought in from [employer], or printed from [employer], patients will apply the Pritikin dining out guidelines that were presented in the [employer] video. Patients will also be able to practice these guidelines in a variety of provided scenarios. The purpose of this lesson is to provide patients with the opportunity to practice hands-on learning of the Pritikin Dining Out guidelines with actual menus and practice scenarios.  Label Reading Clinical staff led group instruction and group discussion with PowerPoint presentation and patient guidebook. To enhance the learning environment the use of posters, models and videos may be added. Patients will review and discuss the Pritikin label reading guidelines presented in Pritikin's Label Reading Educational series video. Using fool labels brought in from local grocery stores and markets, patients will apply the label reading guidelines and determine if the packaged food meet the Pritikin guidelines. The purpose of this lesson is to provide patients with the opportunity to review, discuss, and practice hands-on learning of the Pritikin Label Reading guidelines with actual packaged food labels. Cooking School  Pritikin's [employer] are designed to teach patients ways to prepare quick, simple, and affordable recipes at home. The importance of nutrition's role in chronic disease risk reduction is reflected in its emphasis in the overall Pritikin program. By learning how to prepare essential core Pritikin Eating Plan recipes, patients will increase control over what they eat; be able to customize the flavor of foods without the use of added salt, sugar, or fat; and improve the quality of the food they consume. By learning a set of core recipes which are easily assembled, quickly prepared, and affordable, patients are more likely to prepare more healthy foods at home. These workshops focus on convenient breakfasts, simple entres, side dishes, and desserts which can be prepared with minimal effort and are consistent with nutrition recommendations for cardiovascular risk reduction. Cooking  [employer] are taught by a Armed forces logistics/support/administrative officer (RD) who has been trained by the [employer]. The chef or RD has a clear understanding of the importance of minimizing - if not completely eliminating - added  fat, sugar, and sodium in recipes. Throughout the series of Cooking School Workshop sessions, patients will learn about healthy ingredients and efficient methods of cooking to build confidence in their capability to prepare    Cooking School weekly topics:  Adding Flavor- Sodium-Free  Fast and Healthy Breakfasts  Powerhouse Plant-Based Proteins  Satisfying Salads and Dressings  Simple Sides and Sauces  International Cuisine-Spotlight on the [employer] Zones  Delicious Desserts  Savory Soups  [employer] - Meals in a Astronomer Appetizers and Snacks  Comforting Weekend Breakfasts  One-Pot Wonders   Fast Evening Meals  Landscape architect Your Pritikin Plate  WORKSHOPS   Healthy Mindset (Psychosocial):  Focused Goals, Sustainable Changes Clinical staff led group instruction and group discussion with PowerPoint presentation and patient guidebook. To enhance the learning environment the use of posters, models and videos may be added. Patients will be able to apply effective goal setting strategies to establish at least one personal goal, and then take consistent, meaningful action toward that goal. They will learn to identify common barriers to achieving personal goals and develop strategies to overcome them. Patients will also gain an understanding of how our mind-set can impact our ability to achieve goals and the importance of cultivating a positive and growth-oriented mind-set. The purpose of this lesson is to provide patients with a deeper understanding of how to set and achieve personal goals, as well as the tools and strategies needed to overcome common obstacles which may arise along the way.  From Head to Heart: The Power of a Healthy  Outlook  Clinical staff led group instruction and group discussion with PowerPoint presentation and patient guidebook. To enhance the learning environment the use of posters, models and videos may be added. Patients will be able to recognize and describe the impact of emotions and mood on physical health. They will discover the importance of self-care and explore self-care practices which may work for them. Patients will also learn how to utilize the 4 C's to cultivate a healthier outlook and better manage stress and challenges. The purpose of this lesson is to demonstrate to patients how a healthy outlook is an essential part of maintaining good health, especially as they continue their cardiac rehab journey.  Healthy Sleep for a Healthy Heart Clinical staff led group instruction and group discussion with PowerPoint presentation and patient guidebook. To enhance the learning environment the use of posters, models and videos may be added. At the conclusion of this workshop, patients will be able to demonstrate knowledge of the importance of sleep to overall health, well-being, and quality of life. They will understand the symptoms of, and treatments for, common sleep disorders. Patients will also be able to identify daytime and nighttime behaviors which impact sleep, and they will be able to apply these tools to help manage sleep-related challenges. The purpose of this lesson is to provide patients with a general overview of sleep and outline the importance of quality sleep. Patients will learn about a few of the most common sleep disorders. Patients will also be introduced to the concept of "sleep hygiene," and discover ways to self-manage certain sleeping problems through simple daily behavior changes. Finally, the workshop will motivate patients by clarifying the links between quality sleep and their goals of heart-healthy living.   Recognizing and Reducing Stress Clinical staff led group instruction and  group discussion with PowerPoint presentation and patient guidebook. To enhance the learning environment the use of posters, models and videos may be added.  At the conclusion of this workshop, patients will be able to understand the types of stress reactions, differentiate between acute and chronic stress, and recognize the impact that chronic stress has on their health. They will also be able to apply different coping mechanisms, such as reframing negative self-talk. Patients will have the opportunity to practice a variety of stress management techniques, such as deep abdominal breathing, progressive muscle relaxation, and/or guided imagery.  The purpose of this lesson is to educate patients on the role of stress in their lives and to provide healthy techniques for coping with it.  Learning Barriers/Preferences:  Learning Barriers/Preferences - 11/16/22 1432       Learning Barriers/Preferences   Learning Barriers None    Learning Preferences None             Education Topics:  Knowledge Questionnaire Score:  Knowledge Questionnaire Score - 11/16/22 1522       Knowledge Questionnaire Score   Pre Score 19/24             Core Components/Risk Factors/Patient Goals at Admission:  Personal Goals and Risk Factors at Admission - 11/16/22 1437       Core Components/Risk Factors/Patient Goals on Admission   Hypertension Yes    Intervention Provide education on lifestyle modifcations including regular physical activity/exercise, weight management, moderate sodium restriction and increased consumption of fresh fruit, vegetables, and low fat dairy, alcohol moderation, and smoking cessation.;Monitor prescription use compliance.    Expected Outcomes Short Term: Continued assessment and intervention until BP is < 140/46mm HG in hypertensive participants. < 130/80mm HG in hypertensive participants with diabetes, heart failure or chronic kidney disease.;Long Term: Maintenance of blood pressure  at goal levels.    Lipids Yes    Intervention Provide education and support for participant on nutrition & aerobic/resistive exercise along with prescribed medications to achieve LDL 70mg , HDL >40mg .    Expected Outcomes Short Term: Participant states understanding of desired cholesterol values and is compliant with medications prescribed. Participant is following exercise prescription and nutrition guidelines.;Long Term: Cholesterol controlled with medications as prescribed, with individualized exercise RX and with personalized nutrition plan. Value goals: LDL < 70mg , HDL > 40 mg.             Core Components/Risk Factors/Patient Goals Review:   Goals and Risk Factor Review     Row Name 12/22/22 1507             Core Components/Risk Factors/Patient Goals Review   Personal Goals Review Weight Management/Obesity;Hypertension;Lipids       Review Jahki has been doing well with exercise at cardiac rehab. Vital signs have been stable.       Expected Outcomes Herschell will continue to participate in cardiac rehab for exercise, nutrition and lifestyle modifications                Core Components/Risk Factors/Patient Goals at Discharge (Final Review):   Goals and Risk Factor Review - 12/22/22 1507       Core Components/Risk Factors/Patient Goals Review   Personal Goals Review Weight Management/Obesity;Hypertension;Lipids    Review Merric has been doing well with exercise at cardiac rehab. Vital signs have been stable.    Expected Outcomes Revell will continue to participate in cardiac rehab for exercise, nutrition and lifestyle modifications             ITP Comments:  ITP Comments     Row Name 11/16/22 1300 12/22/22 1502 01/18/23 0742       ITP  Comments Medical Director- Dr. Armanda Magic, MD. Introduction to Pritikin Education Program/ Intensive Cardiac Rehab. Reviewed orientation packet. 30 Day ITP Review. Ikey has good attendance and participation in cardiac rehab. 30 Day ITP  Review. Genaro has good attendance and participation in cardiac rehab. Kenith will complete cardiac rehab on 01/26/23              Comments: See ITP comments.Thayer Headings RN BSN

## 2023-01-19 ENCOUNTER — Encounter (HOSPITAL_COMMUNITY)
Admission: RE | Admit: 2023-01-19 | Discharge: 2023-01-19 | Disposition: A | Discharge status: Home / Self Care | Payer: Medicare HMO | Source: Ambulatory Visit | Attending: Interventional Cardiology | Admitting: Interventional Cardiology

## 2023-01-19 DIAGNOSIS — I252 Old myocardial infarction: Secondary | ICD-10-CM | POA: Diagnosis not present

## 2023-01-19 DIAGNOSIS — I214 Non-ST elevation (NSTEMI) myocardial infarction: Secondary | ICD-10-CM | POA: Diagnosis not present

## 2023-01-19 DIAGNOSIS — Z955 Presence of coronary angioplasty implant and graft: Secondary | ICD-10-CM | POA: Diagnosis not present

## 2023-01-19 DIAGNOSIS — Z48812 Encounter for surgical aftercare following surgery on the circulatory system: Secondary | ICD-10-CM | POA: Diagnosis not present

## 2023-01-20 ENCOUNTER — Other Ambulatory Visit (HOSPITAL_BASED_OUTPATIENT_CLINIC_OR_DEPARTMENT_OTHER): Payer: Self-pay

## 2023-01-21 ENCOUNTER — Encounter (HOSPITAL_COMMUNITY): Payer: Medicare HMO

## 2023-01-25 ENCOUNTER — Encounter (HOSPITAL_BASED_OUTPATIENT_CLINIC_OR_DEPARTMENT_OTHER): Payer: Self-pay | Admitting: Emergency Medicine

## 2023-01-25 ENCOUNTER — Emergency Department (HOSPITAL_BASED_OUTPATIENT_CLINIC_OR_DEPARTMENT_OTHER)
Admission: EM | Admit: 2023-01-25 | Discharge: 2023-01-25 | Disposition: A | Discharge status: Home / Self Care | Payer: Medicare HMO | Attending: Emergency Medicine | Admitting: Emergency Medicine

## 2023-01-25 ENCOUNTER — Emergency Department (HOSPITAL_BASED_OUTPATIENT_CLINIC_OR_DEPARTMENT_OTHER): Payer: Medicare HMO

## 2023-01-25 ENCOUNTER — Other Ambulatory Visit: Payer: Self-pay

## 2023-01-25 DIAGNOSIS — W06XXXA Fall from bed, initial encounter: Secondary | ICD-10-CM | POA: Diagnosis not present

## 2023-01-25 DIAGNOSIS — S0101XA Laceration without foreign body of scalp, initial encounter: Secondary | ICD-10-CM | POA: Insufficient documentation

## 2023-01-25 DIAGNOSIS — Z043 Encounter for examination and observation following other accident: Secondary | ICD-10-CM | POA: Diagnosis not present

## 2023-01-25 DIAGNOSIS — W19XXXA Unspecified fall, initial encounter: Secondary | ICD-10-CM

## 2023-01-25 DIAGNOSIS — Z7982 Long term (current) use of aspirin: Secondary | ICD-10-CM | POA: Insufficient documentation

## 2023-01-25 DIAGNOSIS — R9089 Other abnormal findings on diagnostic imaging of central nervous system: Secondary | ICD-10-CM | POA: Diagnosis not present

## 2023-01-25 HISTORY — DX: Atherosclerotic heart disease of native coronary artery without angina pectoris: I25.10

## 2023-01-25 MED ORDER — LIDOCAINE-EPINEPHRINE (PF) 2 %-1:200000 IJ SOLN
10.0000 mL | Freq: Once | INTRAMUSCULAR | Status: AC
  Administered 2023-01-25: 10 mL via INTRADERMAL
  Filled 2023-01-25: qty 20

## 2023-01-25 MED ORDER — CEPHALEXIN 500 MG PO CAPS: ORAL_CAPSULE | ORAL | 0 refills | Status: DC

## 2023-01-25 NOTE — ED Triage Notes (Signed)
Patient arrived via POV c/o fall with head injury on blood thinners. Patient is AO x 4, VS w/ elevated BP, normal gait.

## 2023-01-25 NOTE — Discharge Instructions (Signed)
Return for redness drainage or if you get a fever.  The staples were placed typically removed on day 7.  This can be done here at urgent care or at your family doctor's office.  The area can get wet but not fully immersed underwater.  No scrubbing.  If you really want to clean it you can apply a half-and-half hydrogen peroxide solution with water on a Q-tip.  You can apply an ointment a couple times a day this could be as simple as Vaseline but could also be an antibiotic ointment if you wish.  Once it is healed please try to avoid prolonged sun exposure use sunscreen.  Gells that have silicone antigens have been shown to reduce scarring in some research.

## 2023-01-25 NOTE — ED Provider Notes (Signed)
[facility] Provider Note   CSN: [id number] Arrival date & time: 01/25/23  0310     History  Chief Complaint  Patient presents with   Bryan Thompson is a 72 y.o. male.  72 yo M with a cc of a fall.  The patient was having a very vivid dream and she ended up falling out of his bed and he struck his head on a knob along one of his dressers.  Has a laceration to the top of his scalp.  Denies other injury.  On blood thinners.   Fall       Home Medications Prior to Admission medications   Medication Sig Start Date End Date Taking? Authorizing Provider  acetaminophen (TYLENOL) 500 MG tablet Take 1,000 mg by mouth every 8 (eight) hours as needed for moderate pain, fever or headache.    [provider]  aspirin EC 81 MG tablet Take 1 tablet (81 mg total) by mouth daily. Swallow whole. 10/02/22   Osvaldo Shipper, MD  Evolocumab (REPATHA SURECLICK) 140 MG/ML SOAJ Inject 140 mg into the skin every 14 (fourteen) days. 10/26/22   Corky Crafts, MD  levothyroxine (SYNTHROID) 137 MCG tablet Take 137 mcg by mouth daily before breakfast.    [provider]  metoprolol succinate (TOPROL-XL) 25 MG 24 hr tablet Take 0.5 tablets (12.5 mg total) by mouth daily. 12/27/22   Corky Crafts, MD  Multiple Vitamins-Minerals (CENTRUM SILVER PO) Take 1 tablet by mouth daily in the afternoon.    [provider]  nitroGLYCERIN (NITROSTAT) 0.4 MG SL tablet Place 1 tablet (0.4 mg total) under the tongue every 5 (five) minutes as needed for chest pain. 10/12/22 01/10/23  Dyann Kief, PA-C  prasugrel (EFFIENT) 10 MG TABS tablet Take 1 tablet (10 mg total) by mouth daily. 12/27/22   Corky Crafts, MD  sertraline (ZOLOFT) 100 MG tablet Take 100 mg by mouth daily in the afternoon. 05/18/21   [provider]  vitamin B-12 (CYANOCOBALAMIN) 1000 MCG tablet Take 1,000 mcg by mouth daily in the afternoon.    [provider]      Allergies    Fish allergy and Prednisone    Review of Systems   Review of Systems  Physical Exam Updated Vital Signs BP (!) 176/75 (BP Location: Right Arm)   Pulse (!) 57   Temp (!) 97.4 F (36.3 C) (Oral)   Resp 14   Ht 5\' 10"  (1.778 m)   Wt 90.7 kg   SpO2 98%   BMI 28.70 kg/m  Physical Exam Vitals and nursing note reviewed.  Constitutional:      Appearance: He is well-developed.  HENT:     Head: Normocephalic.     Comments: Laceration to the vertex of the head Eyes:     Pupils: Pupils are equal, round, and reactive to light.  Neck:     Vascular: No JVD.  Cardiovascular:     Rate and Rhythm: Normal rate and regular rhythm.     Heart sounds: No murmur heard.    No friction rub. No gallop.  Pulmonary:     Effort: No respiratory distress.     Breath sounds: No wheezing.  Abdominal:     General: There is no distension.     Tenderness: There is no abdominal tenderness. There is no guarding or rebound.  Musculoskeletal:        General: Normal range of  motion.     Cervical back: Normal range of motion and neck supple.  Skin:    Coloration: Skin is not pale.     Findings: No rash.  Neurological:     Mental Status: He is alert and oriented to person, place, and time.  Psychiatric:        Behavior: Behavior normal.     ED Results / Procedures / Treatments   Labs (all labs ordered are listed, but only abnormal results are displayed) Labs Reviewed - No data to display  EKG None  Radiology CT Head Wo Contrast  Result Date: 01/25/2023 CLINICAL DATA:  Fall on blood thinner EXAM: CT HEAD WITHOUT CONTRAST TECHNIQUE: Contiguous axial images were obtained from the base of the skull through the vertex without intravenous contrast. RADIATION DOSE REDUCTION: This exam was performed according to the departmental dose-optimization program which includes automated exposure control, adjustment of the mA and/or kV according to patient size and/or use of  iterative reconstruction technique. COMPARISON:  None Available. FINDINGS: Brain: No evidence of acute infarction, hemorrhage, hydrocephalus, extra-axial collection or mass lesion/mass effect. Mild generalized cerebral volume loss for age. Vascular: No hyperdense vessel or unexpected calcification. Skull: Normal. Negative for fracture or focal lesion. Sinuses/Orbits: No acute finding. IMPRESSION: No evidence of intracranial injury. Electronically Signed   By: Tiburcio Pea M.D.   On: 01/25/2023 04:35    Procedures .Marland KitchenLaceration Repair  Date/Time: 01/25/2023 4:49 AM  Performed by: Melene Plan, DO Authorized by: Melene Plan, DO   Consent:    Consent obtained:  Verbal   Consent given by:  Patient   Risks, benefits, and alternatives were discussed: yes     Risks discussed:  Infection, pain, poor cosmetic result and poor wound healing   Alternatives discussed:  No treatment Universal protocol:    Procedure explained and questions answered to patient or proxy's satisfaction: yes     Imaging studies available: yes     Immediately prior to procedure, a time out was called: yes     Patient identity confirmed:  Verbally with patient Laceration details:    Location:  Scalp   Scalp location:  Crown   Length (cm):  8 Pre-procedure details:    Preparation:  Patient was prepped and draped in usual sterile fashion Exploration:    Hemostasis achieved with:  Epinephrine and direct pressure   Imaging obtained comment:  CT   Imaging outcome: foreign body not noted     Wound exploration: entire depth of wound visualized     Wound extent: no underlying fracture   Treatment:    Area cleansed with:  Chlorhexidine   Amount of cleaning:  Standard   Irrigation solution:  Sterile saline   Irrigation volume:  150   Irrigation method:  Pressure wash   Debridement:  None   Undermining:  None   Scar revision: no   Skin repair:    Repair method:  Staples   Number of staples:  8 Approximation:     Approximation:  Close Repair type:    Repair type:  Simple Post-procedure details:    Dressing:  Antibiotic ointment and adhesive bandage   Procedure completion:  Tolerated well, no immediate complications     Medications Ordered in ED Medications  lidocaine-EPINEPHrine (XYLOCAINE W/EPI) 2 %-1:200000 (PF) injection 10 mL (has no administration in time range)    ED Course/ Medical Decision Making/ A&P  Medical Decision Making Amount and/or Complexity of Data Reviewed Radiology: ordered.  Risk Prescription drug management.   72 yo M with a chief complaints of a fall.  Nonsyncopal by history.  Complaining of striking the top of his head.  Has a laceration there.  Will repair at bedside.  CT of the head independently interpreted by me without any cranial hemorrhage.  Discharge home.  PCP follow-up.  4:50 AM:  I have discussed the diagnosis/risks/treatment options with the patient and family.  Evaluation and diagnostic testing in the emergency department does not suggest an emergent condition requiring admission or immediate intervention beyond what has been performed at this time.  They will follow up with PCP. We also discussed returning to the ED immediately if new or worsening sx occur. We discussed the sx which are most concerning (e.g., sudden worsening pain, fever, inability to tolerate by mouth, headache, confusion, vomiting) that necessitate immediate return. Medications administered to the patient during their visit and any new prescriptions provided to the patient are listed below.  Medications given during this visit Medications  lidocaine-EPINEPHrine (XYLOCAINE W/EPI) 2 %-1:200000 (PF) injection 10 mL (has no administration in time range)     The patient appears reasonably screen and/or stabilized for discharge and I doubt any other medical condition or other [facility] requiring further screening, evaluation, or treatment in the ED at this time  prior to discharge.          Final Clinical Impression(s) / ED Diagnoses Final diagnoses:  Fall, initial encounter  Laceration of scalp, initial encounter    Rx / DC Orders ED Discharge Orders     None         Melene Plan, DO 01/25/23 [phone]

## 2023-01-26 ENCOUNTER — Encounter (HOSPITAL_COMMUNITY)
Admission: RE | Admit: 2023-01-26 | Discharge: 2023-01-26 | Disposition: A | Discharge status: Home / Self Care | Payer: Medicare HMO | Source: Ambulatory Visit | Attending: Interventional Cardiology | Admitting: Interventional Cardiology

## 2023-01-26 DIAGNOSIS — I252 Old myocardial infarction: Secondary | ICD-10-CM | POA: Insufficient documentation

## 2023-01-26 DIAGNOSIS — Z48812 Encounter for surgical aftercare following surgery on the circulatory system: Secondary | ICD-10-CM | POA: Insufficient documentation

## 2023-01-26 DIAGNOSIS — Z955 Presence of coronary angioplasty implant and graft: Secondary | ICD-10-CM | POA: Insufficient documentation

## 2023-01-26 NOTE — Progress Notes (Signed)
Incomplete Session Note  Patient Details  Name: Bryan Thompson MRN: [id number] Date of Birth: 1951/03/17 Referring Provider:   Flowsheet Row INTENSIVE CARDIAC REHAB ORIENT from 11/16/2022 in [facility]  Referring Provider Corky Crafts, MD       Bryan Thompson did not complete his rehab session.  Bryan Thompson went to the ED due to a fall

## 2023-01-28 ENCOUNTER — Encounter (HOSPITAL_COMMUNITY): Payer: Medicare HMO

## 2023-02-01 DIAGNOSIS — W06XXXA Fall from bed, initial encounter: Secondary | ICD-10-CM | POA: Diagnosis not present

## 2023-02-01 DIAGNOSIS — R0683 Snoring: Secondary | ICD-10-CM | POA: Diagnosis not present

## 2023-02-01 DIAGNOSIS — F329 Major depressive disorder, single episode, unspecified: Secondary | ICD-10-CM | POA: Diagnosis not present

## 2023-02-01 DIAGNOSIS — S0101XA Laceration without foreign body of scalp, initial encounter: Secondary | ICD-10-CM | POA: Diagnosis not present

## 2023-02-01 DIAGNOSIS — Z4802 Encounter for removal of sutures: Secondary | ICD-10-CM | POA: Diagnosis not present

## 2023-02-01 DIAGNOSIS — I214 Non-ST elevation (NSTEMI) myocardial infarction: Secondary | ICD-10-CM | POA: Diagnosis not present

## 2023-02-01 DIAGNOSIS — I129 Hypertensive chronic kidney disease with stage 1 through stage 4 chronic kidney disease, or unspecified chronic kidney disease: Secondary | ICD-10-CM | POA: Diagnosis not present

## 2023-02-02 ENCOUNTER — Encounter (HOSPITAL_COMMUNITY)
Admission: RE | Admit: 2023-02-02 | Discharge: 2023-02-02 | Disposition: A | Discharge status: Home / Self Care | Payer: Medicare HMO | Source: Ambulatory Visit | Attending: Interventional Cardiology | Admitting: Interventional Cardiology

## 2023-02-02 ENCOUNTER — Telehealth (HOSPITAL_COMMUNITY): Payer: Self-pay | Admitting: *Deleted

## 2023-02-02 VITALS — Ht 70.25 in | Wt 207.2 lb

## 2023-02-02 DIAGNOSIS — I214 Non-ST elevation (NSTEMI) myocardial infarction: Secondary | ICD-10-CM | POA: Diagnosis present

## 2023-02-02 DIAGNOSIS — I252 Old myocardial infarction: Secondary | ICD-10-CM | POA: Diagnosis not present

## 2023-02-02 DIAGNOSIS — Z48812 Encounter for surgical aftercare following surgery on the circulatory system: Secondary | ICD-10-CM | POA: Diagnosis not present

## 2023-02-02 DIAGNOSIS — Z955 Presence of coronary angioplasty implant and graft: Secondary | ICD-10-CM

## 2023-02-02 NOTE — Progress Notes (Signed)
Discharge Progress Report  Patient Details  Name: Bryan Thompson MRN: [id number] Date of Birth: December 25, 1950 Referring Provider:   Flowsheet Row INTENSIVE CARDIAC REHAB ORIENT from 11/16/2022 in [facility]  Referring Provider Corky Crafts, MD        Number of Visits: 40  Reason for Discharge:  Patient reached a stable level of exercise. Patient independent in their exercise. Patient has met program and personal goals.  Smoking History:  Social History   Tobacco Use  Smoking Status Never  Smokeless Tobacco Never    Diagnosis:  09/29/22 NSTEMI (non-ST elevated myocardial infarction) (HCC)  09/29/22 DES Cfx  ADL UCSD:   Initial Exercise Prescription:  Initial Exercise Prescription - 11/16/22 1500       Date of Initial Exercise RX and Referring Provider   Date 11/16/22    Referring Provider Corky Crafts, MD    Expected Discharge Date 01/28/23      Recumbant Bike   Level 1    Minutes 15    METs 2.3      NuStep   Level 2    SPM 85    Minutes 15    METs 2.3      Prescription Details   Frequency (times per week) 3    Duration Progress to 30 minutes of continuous aerobic without signs/symptoms of physical distress      Intensity   THRR 40-80% of Max Heartrate 59-118    Ratings of Perceived Exertion 11-13    Perceived Dyspnea 0-4      Progression   Progression Continue to progress workloads to maintain intensity without signs/symptoms of physical distress.      Resistance Training   Training Prescription Yes    Weight 3 lbs    Reps 10-15             Discharge Exercise Prescription (Final Exercise Prescription Changes):  Exercise Prescription Changes - 02/02/23 1623       Response to Exercise   Blood Pressure (Admit) 130/72    Blood Pressure (Exercise) 148/66    Blood Pressure (Exit) 128/80    Heart Rate (Admit) 64 bpm    Heart Rate (Exercise) 101 bpm    Heart Rate  (Exit) 73 bpm    Rating of Perceived Exertion (Exercise) 12    Perceived Dyspnea (Exercise) 0    Symptoms 0    Comments Pt graduated the CRP2 program    Duration Progress to 30 minutes of  aerobic without signs/symptoms of physical distress    Intensity THRR unchanged      Progression   Progression Continue to progress workloads to maintain intensity without signs/symptoms of physical distress.    Average METs 3.99      Resistance Training   Training Prescription No    Weight 3 lbs    Reps 10-15    Time 10 Minutes      NuStep   Level 3    SPM 136    Minutes 15    METs 4.2      Track   Laps --   Post 6 mwt 1920 ft   Minutes 6    METs 3.78      Home Exercise Plan   Plans to continue exercise at Home (comment)    Frequency Add 3 additional days to program exercise sessions.    Initial Home Exercises Provided 12/06/22  Functional Capacity:  6 Minute Walk     Row Name 11/16/22 1352 02/02/23 1633       6 Minute Walk   Phase Initial Discharge    Distance 1238 feet 1920 feet    Distance % Change -- 55.09 %    Distance Feet Change -- 682 ft    Walk Time 6 minutes 6 minutes    # of Rest Breaks 0 0    MPH 2.34 3.64    METS 2.28 3.8    RPE 11 12    Perceived Dyspnea  0 0    VO2 Peak 7.99 13.31    Symptoms No No    Resting HR 68 bpm 64 bpm    Resting BP 106/72 130/72    Resting Oxygen Saturation  97 % --    Exercise Oxygen Saturation  during 6 min walk 98 % --    Max Ex. HR 80 bpm 94 bpm    Max Ex. BP 120/72 148/66    2 Minute Post BP 120/70 --             Psychological, QOL, Others - Outcomes: PHQ 2/9:    02/02/2023    1:36 PM 11/16/2022    2:13 PM  Depression screen PHQ 2/9  Decreased Interest 0 0  Down, Depressed, Hopeless 0 1  PHQ - 2 Score 0 1  Altered sleeping 0 0  Tired, decreased energy 1 1  Change in appetite 0 0  Feeling bad or failure about yourself  0 1  Trouble concentrating 0 0  Moving slowly or fidgety/restless 0 1   Suicidal thoughts 0 0  PHQ-9 Score 1 4  Difficult doing work/chores Not difficult at all Somewhat difficult    Quality of Life:  Quality of Life - 02/02/23 1634       Quality of Life   Select Quality of Life      Quality of Life Scores   Health/Function Post 26.6 %    Socioeconomic Post 30 %    Psych/Spiritual Post 24.21 %    Family Post 29.5 %    GLOBAL Post 27.31 %             Personal Goals: Goals established at orientation with interventions provided to work toward goal.  Personal Goals and Risk Factors at Admission - 11/16/22 1437       Core Components/Risk Factors/Patient Goals on Admission   Hypertension Yes    Intervention Provide education on lifestyle modifcations including regular physical activity/exercise, weight management, moderate sodium restriction and increased consumption of fresh fruit, vegetables, and low fat dairy, alcohol moderation, and smoking cessation.;Monitor prescription use compliance.    Expected Outcomes Short Term: Continued assessment and intervention until BP is < 140/56mm HG in hypertensive participants. < 130/63mm HG in hypertensive participants with diabetes, heart failure or chronic kidney disease.;Long Term: Maintenance of blood pressure at goal levels.    Lipids Yes    Intervention Provide education and support for participant on nutrition & aerobic/resistive exercise along with prescribed medications to achieve LDL 70mg , HDL >40mg .    Expected Outcomes Short Term: Participant states understanding of desired cholesterol values and is compliant with medications prescribed. Participant is following exercise prescription and nutrition Thompson.;Long Term: Cholesterol controlled with medications as prescribed, with individualized exercise RX and with personalized nutrition plan. Value goals: LDL < 70mg , HDL > 40 mg.              Personal Goals Discharge:  Goals  and Risk Factor Review     Row Name 12/22/22 1507 02/10/23 1357            Core Components/Risk Factors/Patient Goals Review   Personal Goals Review Weight Management/Obesity;Hypertension;Lipids Weight Management/Obesity;Hypertension;Lipids      Review Bryan Thompson has been doing well with exercise at cardiac rehab. Vital signs have been stable. Bryan Thompson completed cardiac rehab on 02/02/23 . Vital signs were  stable.      Expected Outcomes Bryan Thompson will continue to participate in cardiac rehab for exercise, nutrition and lifestyle modifications Bryan Thompson will continue to exercise, follow  nutrition and lifestyle modifications upon completion of cardiac rehab.               Exercise Goals and Review:  Exercise Goals     Row Name 11/16/22 1418             Exercise Goals   Increase Physical Activity Yes       Intervention Provide advice, education, support and counseling about physical activity/exercise needs.;Develop an individualized exercise prescription for aerobic and resistive training based on initial evaluation findings, risk stratification, comorbidities and participant's personal goals.       Expected Outcomes Short Term: Attend rehab on a regular basis to increase amount of physical activity.;Long Term: Exercising regularly at least 3-5 days a week.;Long Term: Add in home exercise to make exercise part of routine and to increase amount of physical activity.       Increase Strength and Stamina Yes       Intervention Provide advice, education, support and counseling about physical activity/exercise needs.;Develop an individualized exercise prescription for aerobic and resistive training based on initial evaluation findings, risk stratification, comorbidities and participant's personal goals.       Expected Outcomes Short Term: Increase workloads from initial exercise prescription for resistance, speed, and METs.;Short Term: Perform resistance training exercises routinely during rehab and add in resistance training at home;Long Term: Improve cardiorespiratory fitness,  muscular endurance and strength as measured by increased METs and functional capacity ( )       Able to understand and use rate of perceived exertion (RPE) scale Yes       Intervention Provide education and explanation on how to use RPE scale       Expected Outcomes Short Term: Able to use RPE daily in rehab to express subjective intensity level;Long Term:  Able to use RPE to guide intensity level when exercising independently       Knowledge and understanding of Target Heart Rate Range (THRR) Yes       Intervention Provide education and explanation of THRR including how the numbers were predicted and where they are located for reference       Expected Outcomes Short Term: Able to state/look up THRR;Short Term: Able to use daily as guideline for intensity in rehab;Long Term: Able to use THRR to govern intensity when exercising independently       Able to check pulse independently Yes       Intervention Provide education and demonstration on how to check pulse in carotid and radial arteries.;Review the importance of being able to check your own pulse for safety during independent exercise       Expected Outcomes Short Term: Able to explain why pulse checking is important during independent exercise;Long Term: Able to check pulse independently and accurately       Understanding of Exercise Prescription Yes       Intervention Provide education, explanation, and written materials on patient's  individual exercise prescription       Expected Outcomes Short Term: Able to explain program exercise prescription;Long Term: Able to explain home exercise prescription to exercise independently                Exercise Goals Re-Evaluation:  Exercise Goals Re-Evaluation     Row Name 11/22/22 1632 12/06/22 1650 12/27/22 1646 02/02/23 1628       Exercise Goal Re-Evaluation   Exercise Goals Review Increase Physical Activity;Understanding of Exercise Prescription;Increase Strength and Stamina;Knowledge and  understanding of Target Heart Rate Range (THRR);Able to understand and use rate of perceived exertion (RPE) scale Increase Physical Activity;Understanding of Exercise Prescription;Increase Strength and Stamina;Knowledge and understanding of Target Heart Rate Range (THRR);Able to understand and use rate of perceived exertion (RPE) scale Increase Physical Activity;Understanding of Exercise Prescription;Increase Strength and Stamina;Knowledge and understanding of Target Heart Rate Range (THRR);Able to understand and use rate of perceived exertion (RPE) scale Increase Physical Activity;Understanding of Exercise Prescription;Increase Strength and Stamina;Knowledge and understanding of Target Heart Rate Range (THRR);Able to understand and use rate of perceived exertion (RPE) scale    Comments Pt first day in the Bryan Thompson ICR program. Pt tolerated exercise well with an average MET level of 2.25. Pt is off to a good start and is learning his THRR, RPE and ExRx Reviewed Bryan Thompson, goals and home ExRx. Pt tolerated exercise well with an average MET level of 3.4. Pt feels good about his goal of gaining stamina, he is already meeting Bryan Thompson by walking 2 miles (30 mins) on the days he doesnt come to [employer]. Reviewed Bryan Thompson and goals. Pt tolerated exercise well with an average MET level of 3.4. Pt has been consistent with his walking and he feels an increase in his stamina. He had a very good cardiology appt today and is excited about decreasing some of his meds. Pt graduated the [employer]. Pt tolerated exercise well with an average MET level of 3.99. Pt did very well in the ICR program and increased on his walk test by 682 more feet (total 1920 feet), this is a 55.09 % increase! Pt also increased his grip strength from 30-42. Pt feels good about his progress and will continue to exercise 5-7 days for 40-45 mins per session    Expected Outcomes Will continue to monitor pt and progress workloads as tolerated without  sign or symptom Will continue to monitor pt and progress workloads as tolerated without sign or symptom Will continue to monitor pt and progress workloads as tolerated without sign or symptom Will continue to monitor pt and progress workloads as tolerated without sign or symptom             Nutrition & Weight - Outcomes:  Pre Biometrics - 02/02/23 1632       Pre Biometrics   Height 5' 10.25" (1.784 m)    Weight 94 kg    Waist Circumference 43 inches    Hip Circumference 43 inches    Waist to Hip Ratio 1 %    BMI (Calculated) 29.54    Triceps Skinfold 22 mm    % Body Fat 31.2 %    Grip Strength 42 kg    Flexibility 0 in    Single Leg Stand 13.5 seconds              Nutrition:  Nutrition Therapy & Goals - 01/27/23 0943       Nutrition Therapy   Diet Heart Healthy Diet  Personal Nutrition Goals   Nutrition Goal Patient to identify strategies for reducing cardiovascular risk by attending the Bryan Thompson education and nutrition series weekly.   goal met.   Personal Goal #2 Patient to improve diet quality by using the plate method as a guide for meal planning to include lean protein/plant protein, fruits, vegetables, whole grains, nonfat dairy as part of a well-balanced diet.   goal in action.   Personal Goal #3 Patient to reduce sodium to 1500mg  per day   goal not met.   Comments Goals in progress. Bryan Thompson continues to attend the [employer] and nutrition series two days per week. He and his wife have started making some changes including increased vegetables, decreased eating out, decreased saturated fat intake. They do continue to be more liberal in sodium intake per recommendations of PCP related to history of low sodium. He has declined statin therapy but did start Repatha on 8/1 as LDL cholesterol remains >100. He has maintained his weight since starting with our program. Patient will benefit from participation in intensive cardiac rehab for nutrition, exercise, and  lifestyle modification.      Intervention Plan   Intervention Prescribe, educate and counsel regarding individualized specific dietary modifications aiming towards targeted core components such as weight, hypertension, lipid management, diabetes, heart failure and other comorbidities.;Nutrition handout(s) given to patient.    Expected Outcomes Short Term Goal: Understand basic principles of dietary content, such as calories, fat, sodium, cholesterol and nutrients.;Long Term Goal: Adherence to prescribed nutrition plan.             Nutrition Discharge:  Nutrition Assessments - 02/02/23 1502       Rate Your Plate Scores   Pre Score 53    Post Score 56             Education Questionnaire Score:  Knowledge Questionnaire Score - 02/02/23 1635       Knowledge Questionnaire Score   Post Score 21/24             Bryan Thompson was  okay to return to exercise per [facility] on his last day of exercise. . Goals reviewed with patient; copy given to patient.Pt graduates from  Intensive/Traditional cardiac rehab program on 02/02/23 with completion of  40 exercise and education sessions. Pt maintained good attendance and progressed nicely during their participation in rehab as evidenced by increased MET level. Bryan Thompson increased his distance on his post exercise walk test by 682 feet.   Medication list reconciled. Repeat  PHQ score- 1 .  Pt has made significant lifestyle changes and should be commended for his success. Bryan Thompson achieved their goals during cardiac rehab.   Pt plans to continue exercise walking daily for 45 minutes. We are proud of Bryan Thompson's progress.Thayer Headings RN BSN

## 2023-02-02 NOTE — Telephone Encounter (Signed)
Spoke with Mr Yehuda Mao he has had his staples removed and is doing fine. Argie has clearance to return to exercise. Emon plans to complete cardiac rehab today.Thayer Headings RN BSN

## 2023-03-04 DIAGNOSIS — R82998 Other abnormal findings in urine: Secondary | ICD-10-CM | POA: Diagnosis not present

## 2023-03-04 DIAGNOSIS — N1831 Chronic kidney disease, stage 3a: Secondary | ICD-10-CM | POA: Diagnosis not present

## 2023-03-04 DIAGNOSIS — Z0000 Encounter for general adult medical examination without abnormal findings: Secondary | ICD-10-CM | POA: Diagnosis not present

## 2023-03-04 DIAGNOSIS — I25119 Atherosclerotic heart disease of native coronary artery with unspecified angina pectoris: Secondary | ICD-10-CM | POA: Diagnosis not present

## 2023-03-04 DIAGNOSIS — Z1331 Encounter for screening for depression: Secondary | ICD-10-CM | POA: Diagnosis not present

## 2023-03-04 DIAGNOSIS — R0683 Snoring: Secondary | ICD-10-CM | POA: Diagnosis not present

## 2023-03-04 DIAGNOSIS — E039 Hypothyroidism, unspecified: Secondary | ICD-10-CM | POA: Diagnosis not present

## 2023-03-04 DIAGNOSIS — Z1339 Encounter for screening examination for other mental health and behavioral disorders: Secondary | ICD-10-CM | POA: Diagnosis not present

## 2023-03-04 DIAGNOSIS — I129 Hypertensive chronic kidney disease with stage 1 through stage 4 chronic kidney disease, or unspecified chronic kidney disease: Secondary | ICD-10-CM | POA: Diagnosis not present

## 2023-03-04 DIAGNOSIS — E785 Hyperlipidemia, unspecified: Secondary | ICD-10-CM | POA: Diagnosis not present

## 2023-03-04 DIAGNOSIS — F331 Major depressive disorder, recurrent, moderate: Secondary | ICD-10-CM | POA: Diagnosis not present

## 2023-03-04 DIAGNOSIS — R7301 Impaired fasting glucose: Secondary | ICD-10-CM | POA: Diagnosis not present

## 2023-03-04 DIAGNOSIS — M109 Gout, unspecified: Secondary | ICD-10-CM | POA: Diagnosis not present

## 2023-03-11 NOTE — Telephone Encounter (Signed)
Call to see if patient has lipid lab done at mid Oct annual physical. N/A LVM

## 2023-03-11 NOTE — Telephone Encounter (Signed)
LDLc : 35 ,VLDL : 27 HDL : 38 , TG: 162, TC:100. Patient called back with lab result. LDLc at goal advised to continue with Repatha and TG slightly above goal;suggest to lower carb and fat intake and start doing some resistance exercises that will help to improve A1c and TG

## 2023-03-21 ENCOUNTER — Other Ambulatory Visit: Payer: Self-pay | Admitting: Interventional Cardiology

## 2023-03-21 ENCOUNTER — Other Ambulatory Visit (HOSPITAL_BASED_OUTPATIENT_CLINIC_OR_DEPARTMENT_OTHER): Payer: Self-pay

## 2023-03-23 ENCOUNTER — Other Ambulatory Visit (HOSPITAL_BASED_OUTPATIENT_CLINIC_OR_DEPARTMENT_OTHER): Payer: Self-pay

## 2023-03-23 MED ORDER — PRASUGREL HCL 10 MG PO TABS
10.0000 mg | ORAL_TABLET | Freq: Every day | ORAL | 2 refills | Status: DC
  Filled 2023-03-23 – 2023-04-16 (×2): qty 90, 90d supply, fill #0
  Filled 2023-07-17: qty 90, 90d supply, fill #1
  Filled 2023-10-12: qty 90, 90d supply, fill #2

## 2023-04-17 ENCOUNTER — Other Ambulatory Visit: Payer: Self-pay

## 2023-04-18 ENCOUNTER — Other Ambulatory Visit (HOSPITAL_BASED_OUTPATIENT_CLINIC_OR_DEPARTMENT_OTHER): Payer: Self-pay

## 2023-04-25 DIAGNOSIS — R69 Illness, unspecified: Secondary | ICD-10-CM | POA: Diagnosis not present

## 2023-04-26 DIAGNOSIS — Z23 Encounter for immunization: Secondary | ICD-10-CM | POA: Diagnosis not present

## 2023-04-29 DIAGNOSIS — R69 Illness, unspecified: Secondary | ICD-10-CM | POA: Diagnosis not present

## 2023-07-11 ENCOUNTER — Encounter: Payer: Self-pay | Admitting: Neurology

## 2023-07-11 ENCOUNTER — Ambulatory Visit: Payer: Medicare HMO | Admitting: Neurology

## 2023-07-11 VITALS — BP 154/83 | HR 88 | Ht 70.0 in | Wt 221.0 lb

## 2023-07-11 DIAGNOSIS — I2510 Atherosclerotic heart disease of native coronary artery without angina pectoris: Secondary | ICD-10-CM

## 2023-07-11 DIAGNOSIS — Z9189 Other specified personal risk factors, not elsewhere classified: Secondary | ICD-10-CM | POA: Diagnosis not present

## 2023-07-11 DIAGNOSIS — R0683 Snoring: Secondary | ICD-10-CM | POA: Diagnosis not present

## 2023-07-11 DIAGNOSIS — G4752 REM sleep behavior disorder: Secondary | ICD-10-CM

## 2023-07-11 DIAGNOSIS — R351 Nocturia: Secondary | ICD-10-CM | POA: Diagnosis not present

## 2023-07-11 DIAGNOSIS — I471 Supraventricular tachycardia, unspecified: Secondary | ICD-10-CM | POA: Diagnosis not present

## 2023-07-11 NOTE — Patient Instructions (Signed)

## 2023-07-11 NOTE — Progress Notes (Signed)
 Subjective:    Patient ID: Bryan Thompson is a 73 y.o. male.  HPI    [name], MD, PhD [facility] [address], [address] [address] [place], [place] [address]  Dear Dr. Felipa Eth,  I saw the patient, Bryan Thompson, upon your kind request in my sleep clinic today for initial consultation of his sleep disorder, in particular, concern for parasomnias.  The patient is accompanied by his wife today.  As you know, [name] is a 73 year old male with an underlying medical history of hypothyroidism, hyperlipidemia, childhood asthma, chronic kidney disease, coronary artery disease with history of non-STEMI and status post stent placement, history of SVT, depression, gout versus pseudogout, and borderline obesity, who reports active dreams and residual depression.  His sleep-related history is primarily provided by his wife who reports that patient has had dream enactment behavior for the past 5 or 6 years, started in 2018 as per her recollection.  Symptoms were infrequent at the time but have become worse in frequency over time and also in intensity.  He has recently rolled off the couch and in September 2024 he fell off the bed and hit his head.  He frequently talks in his sleep and moves or twitches and punches.  She no longer sleeps in the same room with him typically, for the past 2 years.  He has nocturia about twice per average night, denies recurrent nocturnal morning headaches.  His Epworth sleepiness score is 4 out of 24, fatigue severity score is 44 out of 63.  I reviewed your office note from 03/04/2023. Of note, he has been on sertraline for years.  It was recently reduced to 50 mg once daily but he was up to 100 mg once daily in the recent past.  His wife believes that since increasing the sertraline to 100 mg daily his dream and acting behavior became worse.  She also feels that his memory is not as good any longer.  He does not have a family history of dementia or  Parkinson's disease, but there is a family history of depression. He had a fall in September and went to the emergency room at [facility], I reviewed the emergency room records.  Bryan Thompson out of bed and hit his head against the dresser.  He had a head CT without contrast on 01/25/2023 and I reviewed the results: Impression: No evidence of intracranial injury.  He has not fallen otherwise.  He used to be very active, was a Copywriter, advertising for [employer] and also had his own lawn care business. He is not on smoker.  He does not currently drink any alcohol.  He drinks quite a bit of caffeine in the form of soda, about 3 cans/day and coffee, about 3 cups/day on average.  He mostly drinks coffee when they go out to eat which is nearly daily.  Bedtime is generally between 10 and 10:30 PM and rise time between 4 and 6 AM.  They have 3 grown children.  He does have residual depression and is currently on sertraline 50 mg once daily.  He was started on BuSpar, currently at 50 mg twice daily.  His Past Medical History Is Significant For: Past Medical History:  Diagnosis Date   Coronary artery disease    COVID-19    Depression    Hernia, umbilical    Hyperlipidemia    Hypertension    Thyroid disease     His Past Surgical History Is Significant For:  Past Surgical History:  Procedure Laterality Date   CATARACT EXTRACTION     CORONARY STENT INTERVENTION N/A 09/29/2022   Procedure: CORONARY STENT INTERVENTION;  Surgeon: Lennette Bihari, MD;  Location: [facility] INVASIVE CV LAB;  Service: Cardiovascular;  Laterality: N/A;   CORONARY THROMBECTOMY N/A 09/29/2022   Procedure: Coronary Thrombectomy;  Surgeon: Lennette Bihari, MD;  [facility] INVASIVE CV LAB;  Service: Cardiovascular;  Laterality: N/A;   LEFT HEART CATH AND CORONARY ANGIOGRAPHY N/A 09/29/2022   Procedure: LEFT HEART CATH AND CORONARY ANGIOGRAPHY;  Surgeon: Lennette Bihari, MD;  Location: [facility] INVASIVE CV LAB;  Service: Cardiovascular;  Laterality: N/A;    TONSILLECTOMY      His Family History Is Significant For: Family History  Problem Relation Age of Onset   Cancer Mother        breast CA   Heart disease Mother        mitral valve surgery   Colon polyps Father    Hypertension Father    Colon cancer Neg Hx    Esophageal cancer Neg Hx    Rectal cancer Neg Hx    Stomach cancer Neg Hx     His Social History Is Significant For: Social History   Socioeconomic History   Marital status: Married    Spouse name: Not on file   Number of children: Not on file   Years of education: Not on file   Highest education level: Not on file  Occupational History   Occupation: retired  Tobacco Use   Smoking status: Never   Smokeless tobacco: Never  Vaping Use   Vaping status: Never Used  Substance and Sexual Activity   Alcohol use: Not Currently   Drug use: Never   Sexual activity: Not on file  Other Topics Concern   Not on file  Social History Narrative   Not on file   Social Drivers of Health   Financial Resource Strain: Not on file  Food Insecurity: No Food Insecurity (09/29/2022)   Hunger Vital Sign    Worried About Running Out of Food in the Last Year: Never true    Ran Out of Food in the Last Year: Never true  Transportation Needs: No Transportation Needs (09/29/2022)   PRAPARE - Administrator, Civil Service (Medical): No    Lack of Transportation (Non-Medical): No  Physical Activity: Not on file  Stress: Not on file  Social Connections: Not on file    His Allergies Are:  Allergies  Allergen Reactions   Fish Allergy Swelling    Only fried fish   Prednisone Anxiety  :   His Current Medications Are:  Outpatient Encounter Medications as of 07/11/2023  Medication Sig   acetaminophen (TYLENOL) 500 MG tablet Take 1,000 mg by mouth every 8 (eight) hours as needed for moderate pain, fever or headache.   aspirin EC 81 MG tablet Take 1 tablet (81 mg total) by mouth daily. Swallow whole.   busPIRone (BUSPAR) 5 MG  tablet Take 5 mg by mouth 2 (two) times daily.   Evolocumab (REPATHA SURECLICK) 140 MG/ML SOAJ Inject 140 mg into the skin every 14 (fourteen) days.   levothyroxine (SYNTHROID) 137 MCG tablet Take 137 mcg by mouth daily before breakfast.   metoprolol succinate (TOPROL-XL) 25 MG 24 hr tablet Take 0.5 tablets (12.5 mg total) by mouth daily.   Multiple Vitamins-Minerals (CENTRUM SILVER PO) Take 1 tablet by mouth daily in the afternoon.   nitroGLYCERIN (NITROSTAT) 0.4 MG SL tablet  Place 1 tablet (0.4 mg total) under the tongue every 5 (five) minutes as needed for chest pain.   prasugrel (EFFIENT) 10 MG TABS tablet Take 1 tablet (10 mg total) by mouth daily.   sertraline (ZOLOFT) 100 MG tablet Take 50 mg by mouth daily in the afternoon. Decrease to 75 mg daily and then 50 mg a day   vitamin B-12 (CYANOCOBALAMIN) 1000 MCG tablet Take 1,000 mcg by mouth daily in the afternoon.   No facility-administered encounter medications on file as of 07/11/2023.  :   Review of Systems:  Out of a complete 14 point review of systems, all are reviewed and negative with the exception of these symptoms as listed below:  Review of Systems  Neurological:        Patient in room #9 with his wife. Patient wife states that the patient acts out while sleeping and falling out the couch while sleeping. Patient isn't aware of these events.    Objective:  Neurological Exam  Physical Exam Physical Examination:   Vitals:   07/11/23 1344  BP: (!) 154/83  Pulse: 88    General Examination: The patient is a very pleasant 73 y.o. male in no acute distress. He appears well-developed and well-nourished and well groomed.   HEENT: Normocephalic, atraumatic, pupils are equal, round and reactive to light, extraocular tracking is good without limitation to gaze excursion or nystagmus noted. Hearing is grossly intact. Face is symmetric with normal facial animation. Speech is clear with no dysarthria noted. There is no hypophonia.  There is no lip, neck/head, jaw or voice tremor. Neck is supple with full range of passive and active motion. There are no carotid bruits on auscultation. Oropharynx exam reveals: mild mouth dryness, adequate dental hygiene and mild airway crowding, due to small airway entry, tonsils absent, redundant soft palate noted.  Mallampati class III, wider uvula.  Tongue protrudes centrally and palate elevates symmetrically, tonsils absent, neck circumference 16 three-quarter inches.  Minimal overbite noted.  Chest: Clear to auscultation without wheezing, rhonchi or crackles noted.  Heart: S1+S2+0, regular and normal without murmurs, rubs or gallops noted.   Abdomen: Soft, non-tender and non-distended.  Extremities: There is no pitting edema in the distal lower extremities bilaterally.   Skin: Warm and dry without trophic changes noted.   Musculoskeletal: exam reveals no obvious joint deformities.   Neurologically:  Mental status: The patient is awake, alert and oriented in all 4 spheres. His immediate and remote memory, attention, language skills and fund of knowledge are appropriate. There is no evidence of aphasia, agnosia, apraxia or anomia. Speech is clear with normal prosody and enunciation. Thought process is linear. Mood is normal and affect is normal.  Cranial nerves II - XII are as described above under HEENT exam.  Motor exam: Normal bulk, strength and tone is noted. There is no obvious action or resting tremor.  Fine motor skills and coordination: grossly intact.  Cerebellar testing: No dysmetria or intention tremor. There is no truncal or gait ataxia.  Sensory exam: intact to light touch in the upper and lower extremities.  Gait, station and balance: He stands easily. No veering to one side is noted. No leaning to one side is noted. Posture is age-appropriate.  He walks slightly slowly with slight decrease in arm swing noted on the right side.    Assessment and Plan:  In summary, TOMOTHY EDDINS is a very pleasant 73 y.o.-year old male with an underlying medical history of hypothyroidism, hyperlipidemia, childhood  asthma, chronic kidney disease, coronary artery disease with history of non-STEMI and status post stent placement, history of SVT, depression, gout versus pseudogout, and borderline obesity, who presents for evaluation of his sleep disturbance of several years duration, particularly dream enactment behavior that has been noticed since 2018 per wife's observation.  We talked about parasomnias and in particular, REM behavior disorder at length today.  Sometimes medications can increase the risk for REM behavior disorder, particularly SSRI medications.  He is advised to talk to your office about a further follow-up for the possibility of tapering off sertraline gradually and maybe trying a different antidepressant.  Sometimes changing the antidepressant even though most SSRI and SNRI medications can cause RBD can be helpful especially if he has residual depression on the current regimen.  If he has any suicidal ideations or is at risk of harming himself or his spouse, they are strongly advised to call 911 right away.  He may be at risk for obstructive sleep apnea.  I would recommend we proceed with a laboratory attended sleep study to further delineate his sleep disorder.    I had a long chat with the patient and his wife about my findings and the diagnosis of sleep apnea, particularly OSA, its prognosis and treatment options. We talked about medical/conservative treatments, surgical interventions and non-pharmacological approaches for symptom control. I explained, in particular, the risks and ramifications of untreated moderate to severe OSA, especially with respect to developing cardiovascular disease down the road, including congestive heart failure (CHF), difficult to treat hypertension, cardiac arrhythmias (particularly A-fib), neurovascular complications including TIA, stroke and  dementia. Even type 2 diabetes has, in part, been linked to untreated OSA. Symptoms of untreated OSA may include (but may not be limited to) daytime sleepiness, nocturia (i.e. frequent nighttime urination), memory problems, mood irritability and suboptimally controlled or worsening mood disorder such as depression and/or anxiety, lack of energy, lack of motivation, physical discomfort, as well as recurrent headaches, especially morning or nocturnal headaches. We talked about the importance of maintaining a healthy lifestyle and striving for healthy weight.  We may consider melatonin for treatment of RBD in the near future.  We will continue to monitor him for any obvious signs of a neurodegenerative condition such as dementia or Parkinson's disease.  In addition, we talked about the importance of striving for and maintaining good sleep hygiene. I recommended a sleep study at this time. I outlined the differences between a laboratory attended sleep study which is considered more comprehensive and accurate over the option of a home sleep test (HST); the latter may lead to underestimation of sleep disordered breathing in some instances and does not help with diagnosing upper airway resistance syndrome and is not accurate enough to diagnose primary central sleep apnea typically. I outlined possible surgical and non-surgical treatment options of OSA, including the use of a positive airway pressure (PAP) device (i.e. CPAP, AutoPAP/APAP or BiPAP in certain circumstances), a custom-made dental device (aka oral appliance, which would require a referral to a specialist dentist or orthodontist typically, and is generally speaking not considered for patients with full dentures or edentulous state), upper airway surgical options, such as traditional UPPP (which is not considered a first-line treatment) or the Inspire device (hypoglossal nerve stimulator, which would involve a referral for consultation with an ENT surgeon,  after careful selection, following inclusion criteria - also not first-line treatment). I explained the PAP treatment option to the patient in detail, as this is generally considered first-line treatment.  The patient indicated that he would be willing to try PAP therapy, if the need arises. I explained the importance of being compliant with PAP treatment, not only for insurance purposes but primarily to improve patient's symptoms symptoms, and for the patient's long term health benefit, including to reduce His cardiovascular risks longer-term.    We will pick up our discussion about the next steps and treatment options after testing.  We will keep him posted as to the test results by phone call and/or MyChart messaging where possible.  We will plan to follow-up in sleep clinic accordingly as well.  I answered all their questions today and the patient and and his wife were in agreement.   I encouraged him to call with any interim questions, concerns, problems or updates or email [place] through MyChart.  Generally speaking, sleep test authorizations may take up to 2 weeks, sometimes less, sometimes longer, the patient is encouraged to get in touch with [place] if they do not hear back from the sleep lab staff directly within the next 2 weeks. This was an extended visit of over 60 minutes with copious record review including notes that the patient's wife had taken, ER record review and paper chart review as well as due to addressing multiple problems.  Thank you very much for allowing me to participate in the care of this nice patient. If I can be of any further assistance to you please do not hesitate to call me at [phone].  Sincerely,   [name], MD, PhD

## 2023-07-18 DIAGNOSIS — H52223 Regular astigmatism, bilateral: Secondary | ICD-10-CM | POA: Diagnosis not present

## 2023-07-18 DIAGNOSIS — H524 Presbyopia: Secondary | ICD-10-CM | POA: Diagnosis not present

## 2023-07-18 DIAGNOSIS — Z961 Presence of intraocular lens: Secondary | ICD-10-CM | POA: Diagnosis not present

## 2023-07-18 DIAGNOSIS — Z0100 Encounter for examination of eyes and vision without abnormal findings: Secondary | ICD-10-CM | POA: Diagnosis not present

## 2023-07-18 DIAGNOSIS — H0288A Meibomian gland dysfunction right eye, upper and lower eyelids: Secondary | ICD-10-CM | POA: Diagnosis not present

## 2023-07-18 DIAGNOSIS — H0288B Meibomian gland dysfunction left eye, upper and lower eyelids: Secondary | ICD-10-CM | POA: Diagnosis not present

## 2023-07-18 DIAGNOSIS — H5203 Hypermetropia, bilateral: Secondary | ICD-10-CM | POA: Diagnosis not present

## 2023-07-19 ENCOUNTER — Other Ambulatory Visit (HOSPITAL_BASED_OUTPATIENT_CLINIC_OR_DEPARTMENT_OTHER): Payer: Self-pay

## 2023-07-29 DIAGNOSIS — Z961 Presence of intraocular lens: Secondary | ICD-10-CM | POA: Diagnosis not present

## 2023-07-29 DIAGNOSIS — H16223 Keratoconjunctivitis sicca, not specified as Sjogren's, bilateral: Secondary | ICD-10-CM | POA: Diagnosis not present

## 2023-08-01 DIAGNOSIS — M1990 Unspecified osteoarthritis, unspecified site: Secondary | ICD-10-CM | POA: Diagnosis not present

## 2023-08-01 DIAGNOSIS — I129 Hypertensive chronic kidney disease with stage 1 through stage 4 chronic kidney disease, or unspecified chronic kidney disease: Secondary | ICD-10-CM | POA: Diagnosis not present

## 2023-08-01 DIAGNOSIS — N183 Chronic kidney disease, stage 3 unspecified: Secondary | ICD-10-CM | POA: Diagnosis not present

## 2023-08-01 DIAGNOSIS — D472 Monoclonal gammopathy: Secondary | ICD-10-CM | POA: Diagnosis not present

## 2023-08-01 DIAGNOSIS — E039 Hypothyroidism, unspecified: Secondary | ICD-10-CM | POA: Diagnosis not present

## 2023-08-01 DIAGNOSIS — I25119 Atherosclerotic heart disease of native coronary artery with unspecified angina pectoris: Secondary | ICD-10-CM | POA: Diagnosis not present

## 2023-08-01 DIAGNOSIS — F331 Major depressive disorder, recurrent, moderate: Secondary | ICD-10-CM | POA: Diagnosis not present

## 2023-08-01 DIAGNOSIS — R0683 Snoring: Secondary | ICD-10-CM | POA: Diagnosis not present

## 2023-08-01 DIAGNOSIS — E785 Hyperlipidemia, unspecified: Secondary | ICD-10-CM | POA: Diagnosis not present

## 2023-08-01 DIAGNOSIS — R7301 Impaired fasting glucose: Secondary | ICD-10-CM | POA: Diagnosis not present

## 2023-08-01 DIAGNOSIS — L299 Pruritus, unspecified: Secondary | ICD-10-CM | POA: Diagnosis not present

## 2023-08-01 DIAGNOSIS — N1831 Chronic kidney disease, stage 3a: Secondary | ICD-10-CM | POA: Diagnosis not present

## 2023-08-05 ENCOUNTER — Other Ambulatory Visit: Payer: Self-pay

## 2023-08-05 DIAGNOSIS — D472 Monoclonal gammopathy: Secondary | ICD-10-CM

## 2023-08-08 ENCOUNTER — Inpatient Hospital Stay: Payer: Medicare HMO

## 2023-08-08 ENCOUNTER — Telehealth: Payer: Self-pay | Admitting: Physician Assistant

## 2023-08-12 ENCOUNTER — Inpatient Hospital Stay: Attending: Physician Assistant

## 2023-08-12 DIAGNOSIS — D472 Monoclonal gammopathy: Secondary | ICD-10-CM | POA: Insufficient documentation

## 2023-08-12 DIAGNOSIS — Z79899 Other long term (current) drug therapy: Secondary | ICD-10-CM | POA: Diagnosis not present

## 2023-08-12 LAB — CMP (CANCER CENTER ONLY)
ALT: 14 U/L (ref 0–44)
AST: 18 U/L (ref 15–41)
Albumin: 4.1 g/dL (ref 3.5–5.0)
Alkaline Phosphatase: 58 U/L (ref 38–126)
Anion gap: 9 (ref 5–15)
BUN: 17 mg/dL (ref 8–23)
CO2: 28 mmol/L (ref 22–32)
Calcium: 9.1 mg/dL (ref 8.9–10.3)
Chloride: 101 mmol/L (ref 98–111)
Creatinine: 1.25 mg/dL — ABNORMAL HIGH (ref 0.61–1.24)
GFR, Estimated: >60 mL/min (ref >60)
Glucose, Bld: 105 mg/dL — ABNORMAL HIGH (ref 70–99)
Potassium: 3.9 mmol/L (ref 3.5–5.1)
Sodium: 138 mmol/L (ref 135–145)
Total Bilirubin: 0.4 mg/dL (ref 0.0–1.2)
Total Protein: 7.7 g/dL (ref 6.5–8.1)

## 2023-08-12 LAB — CBC WITH DIFFERENTIAL (CANCER CENTER ONLY)
Abs Immature Granulocytes: 0.02 10*3/uL (ref 0.00–0.07)
Basophils Absolute: 0 10*3/uL (ref 0.0–0.1)
Basophils Relative: 1 %
Eosinophils Absolute: 0.3 10*3/uL (ref 0.0–0.5)
Eosinophils Relative: 4 %
HCT: 40 % (ref 39.0–52.0)
Hemoglobin: 13.9 g/dL (ref 13.0–17.0)
Immature Granulocytes: 0 %
Lymphocytes Relative: 32 %
Lymphs Abs: 2.7 10*3/uL (ref 0.7–4.0)
MCH: 31.5 pg (ref 26.0–34.0)
MCHC: 34.8 g/dL (ref 30.0–36.0)
MCV: 90.7 fL (ref 80.0–100.0)
Monocytes Absolute: 0.8 10*3/uL (ref 0.1–1.0)
Monocytes Relative: 9 %
Neutro Abs: 4.6 10*3/uL (ref 1.7–7.7)
Neutrophils Relative %: 54 %
Platelet Count: 270 10*3/uL (ref 150–400)
RBC: 4.41 MIL/uL (ref 4.22–5.81)
RDW: 12.7 % (ref 11.5–15.5)
WBC Count: 8.5 10*3/uL (ref 4.0–10.5)
nRBC: 0 % (ref 0.0–0.2)

## 2023-08-15 LAB — MULTIPLE MYELOMA PANEL, SERUM
Albumin SerPl Elph-Mcnc: 3.6 g/dL (ref 2.9–4.4)
Albumin/Glob SerPl: 1.1 (ref 0.7–1.7)
Alpha 1: 0.2 g/dL (ref 0.0–0.4)
Alpha2 Glob SerPl Elph-Mcnc: 1 g/dL (ref 0.4–1.0)
B-Globulin SerPl Elph-Mcnc: 1.4 g/dL — ABNORMAL HIGH (ref 0.7–1.3)
Gamma Glob SerPl Elph-Mcnc: 0.9 g/dL (ref 0.4–1.8)
Globulin, Total: 3.4 g/dL (ref 2.2–3.9)
IgA: 382 mg/dL (ref 61–437)
IgG (Immunoglobin G), Serum: 1051 mg/dL (ref 603–1613)
IgM (Immunoglobulin M), Srm: 172 mg/dL — ABNORMAL HIGH (ref 15–143)
Total Protein ELP: 7 g/dL (ref 6.0–8.5)

## 2023-08-15 LAB — KAPPA/LAMBDA LIGHT CHAINS
Kappa free light chain: 31.8 mg/L — ABNORMAL HIGH (ref 3.3–19.4)
Kappa, lambda light chain ratio: 1.37 (ref 0.26–1.65)
Lambda free light chains: 23.2 mg/L (ref 5.7–26.3)

## 2023-08-23 ENCOUNTER — Other Ambulatory Visit: Payer: Self-pay

## 2023-08-23 ENCOUNTER — Inpatient Hospital Stay: Payer: Medicare HMO | Attending: Physician Assistant | Admitting: Physician Assistant

## 2023-08-23 VITALS — BP 170/93 | HR 70 | Temp 97.3°F | Resp 13 | Wt 217.9 lb

## 2023-08-23 DIAGNOSIS — Z83719 Family history of colon polyps, unspecified: Secondary | ICD-10-CM | POA: Insufficient documentation

## 2023-08-23 DIAGNOSIS — Z79899 Other long term (current) drug therapy: Secondary | ICD-10-CM | POA: Insufficient documentation

## 2023-08-23 DIAGNOSIS — I2510 Atherosclerotic heart disease of native coronary artery without angina pectoris: Secondary | ICD-10-CM | POA: Insufficient documentation

## 2023-08-23 DIAGNOSIS — Z7902 Long term (current) use of antithrombotics/antiplatelets: Secondary | ICD-10-CM | POA: Diagnosis not present

## 2023-08-23 DIAGNOSIS — Z803 Family history of malignant neoplasm of breast: Secondary | ICD-10-CM | POA: Diagnosis not present

## 2023-08-23 DIAGNOSIS — F32A Depression, unspecified: Secondary | ICD-10-CM | POA: Diagnosis not present

## 2023-08-23 DIAGNOSIS — Z8249 Family history of ischemic heart disease and other diseases of the circulatory system: Secondary | ICD-10-CM | POA: Insufficient documentation

## 2023-08-23 DIAGNOSIS — Z809 Family history of malignant neoplasm, unspecified: Secondary | ICD-10-CM | POA: Diagnosis not present

## 2023-08-23 DIAGNOSIS — I10 Essential (primary) hypertension: Secondary | ICD-10-CM | POA: Insufficient documentation

## 2023-08-23 DIAGNOSIS — E785 Hyperlipidemia, unspecified: Secondary | ICD-10-CM | POA: Diagnosis not present

## 2023-08-23 DIAGNOSIS — Z8616 Personal history of COVID-19: Secondary | ICD-10-CM | POA: Insufficient documentation

## 2023-08-23 DIAGNOSIS — D472 Monoclonal gammopathy: Secondary | ICD-10-CM | POA: Diagnosis not present

## 2023-08-23 DIAGNOSIS — R5383 Other fatigue: Secondary | ICD-10-CM | POA: Insufficient documentation

## 2023-08-23 DIAGNOSIS — K5900 Constipation, unspecified: Secondary | ICD-10-CM | POA: Diagnosis not present

## 2023-08-23 DIAGNOSIS — Z888 Allergy status to other drugs, medicaments and biological substances status: Secondary | ICD-10-CM | POA: Diagnosis not present

## 2023-08-23 DIAGNOSIS — Z9089 Acquired absence of other organs: Secondary | ICD-10-CM | POA: Insufficient documentation

## 2023-08-23 NOTE — Progress Notes (Signed)
 [facility] [phone]   [phone]  PROGRESS NOTE  Patient Care Team: Chilton Greathouse, MD as PCP - General (Internal Medicine) Corky Crafts, MD as PCP - Cardiology (Cardiology)  Hematological/Oncological History 1) Labs from PCP, Dr. Chilton Greathouse: -09/25/2021: Creatinine 1.8, Bun 26 -10/26/2021: Random Urine-no M-spike detected. SPEP-no M-spoke, IFE showed IgM monoclonal protein. Creatinine 1.2 -01/07/2022: WBC 8.61, Hgb 13.8, Plt 238, Creatinine 1.3, Calcium 9.0  2) 01/15/2022: Establish care with [facility] Hematology with Dr. Jeanie Sewer and Georga Kaufmann PA-C  CHIEF COMPLAINTS/PURPOSE OF CONSULTATION:  IgM MGUS  HISTORY OF PRESENTING ILLNESS:  Bryan Thompson 73 y.o. male returns for a follow up for IgM MGUS. He is accompanied by his wife for this visit.   On exam today, Mr. Svec reports he was hospitalized for a heart attach May 2024. He has been struggling with fatigue and uncertain the underlying cause. His PCP is adjusting his thyroid medication which has improved his energy levels. He can complete his daily activities on his own. He denies any appetite or weight changes. He denies nausea, vomiting or abdominal pain. He does have ongoing constipation which he noticed after receiving his second COVID vaccination. He takes metamucil plus dulcolax which improved his bowel habits. He denies easy bruising or signs of active bleeding.  Patient denies any bone pain including back or hip pain.  He denies fevers, chills, night sweats, shortness of breath, chest pain or cough.  He has no other complaints.  Rest of the 10 point ROS is below.  MEDICAL HISTORY:  Past Medical History:  Diagnosis Date   Coronary artery disease    COVID-19    Depression    Hernia, umbilical    Hyperlipidemia    Hypertension    Thyroid disease     SURGICAL HISTORY: Past Surgical History:  Procedure Laterality Date   CATARACT EXTRACTION     CORONARY STENT  INTERVENTION N/A 09/29/2022   Procedure: CORONARY STENT INTERVENTION;  Surgeon: Lennette Bihari, MD;  Location: [facility] INVASIVE CV LAB;  Service: Cardiovascular;  Laterality: N/A;   CORONARY THROMBECTOMY N/A 09/29/2022   Procedure: Coronary Thrombectomy;  Surgeon: Lennette Bihari, MD;  Location: [facility] INVASIVE CV LAB;  Service: Cardiovascular;  Laterality: N/A;   LEFT HEART CATH AND CORONARY ANGIOGRAPHY N/A 09/29/2022   Procedure: LEFT HEART CATH AND CORONARY ANGIOGRAPHY;  Surgeon: Lennette Bihari, MD;  Location: [facility] INVASIVE CV LAB;  Service: Cardiovascular;  Laterality: N/A;   TONSILLECTOMY      SOCIAL HISTORY: Social History   Socioeconomic History   Marital status: Married    Spouse name: Not on file   Number of children: Not on file   Years of education: Not on file   Highest education level: Not on file  Occupational History   Occupation: retired  Tobacco Use   Smoking status: Never   Smokeless tobacco: Never  Vaping Use   Vaping status: Never Used  Substance and Sexual Activity   Alcohol use: Not Currently   Drug use: Never   Sexual activity: Not on file  Other Topics Concern   Not on file  Social History Narrative   Not on file   Social Drivers of Health   Financial Resource Strain: Not on file  Food Insecurity: No Food Insecurity (09/29/2022)   Hunger Vital Sign    Worried About Running Out of Food in the Last Year: Never true    Ran Out of Food in the Last Year: Never true  Transportation Needs: No Transportation Needs (09/29/2022)   PRAPARE - Administrator, Civil Service (Medical): No    Lack of Transportation (Non-Medical): No  Physical Activity: Not on file  Stress: Not on file  Social Connections: Not on file  Intimate Partner Violence: Not At Risk (09/29/2022)   Humiliation, Afraid, Rape, and Kick questionnaire    Fear of Current or Ex-Partner: No    Emotionally Abused: No    Physically Abused: No    Sexually Abused: No    FAMILY HISTORY: Family History   Problem Relation Age of Onset   Cancer Mother        breast CA   Heart disease Mother        mitral valve surgery   Colon polyps Father    Hypertension Father    Colon cancer Neg Hx    Esophageal cancer Neg Hx    Rectal cancer Neg Hx    Stomach cancer Neg Hx     ALLERGIES:  is allergic to fish allergy, statins, and prednisone.  MEDICATIONS:  Current Outpatient Medications  Medication Sig Dispense Refill   acetaminophen (TYLENOL) 500 MG tablet Take 1,000 mg by mouth every 8 (eight) hours as needed for moderate pain, fever or headache.     aspirin EC 81 MG tablet Take 1 tablet (81 mg total) by mouth daily. Swallow whole. 30 tablet 12   busPIRone (BUSPAR) 5 MG tablet Take 5 mg by mouth 2 (two) times daily.     Evolocumab (REPATHA SURECLICK) 140 MG/ML SOAJ Inject 140 mg into the skin every 14 (fourteen) days. 6 mL 3   metoprolol succinate (TOPROL-XL) 25 MG 24 hr tablet Take 0.5 tablets (12.5 mg total) by mouth daily. 30 tablet 3   Multiple Vitamins-Minerals (CENTRUM SILVER PO) Take 1 tablet by mouth daily in the afternoon.     prasugrel (EFFIENT) 10 MG TABS tablet Take 1 tablet (10 mg total) by mouth daily. 90 tablet 2   sertraline (ZOLOFT) 50 MG tablet 50 mg daily.     SYNTHROID 112 MCG tablet Take 112 mcg by mouth daily before breakfast.     vitamin B-12 (CYANOCOBALAMIN) 1000 MCG tablet Take 1,000 mcg by mouth daily in the afternoon.     levothyroxine (SYNTHROID) 137 MCG tablet Take 137 mcg by mouth daily before breakfast.     nitroGLYCERIN (NITROSTAT) 0.4 MG SL tablet Place 1 tablet (0.4 mg total) under the tongue every 5 (five) minutes as needed for chest pain. 25 tablet 3   sertraline (ZOLOFT) 100 MG tablet Take 50 mg by mouth daily in the afternoon. Decrease to 75 mg daily and then 50 mg a day     No current facility-administered medications for this visit.    REVIEW OF SYSTEMS:   Constitutional: ( - ) fevers, ( - )  chills , ( - ) night sweats Eyes: ( - ) blurriness of  vision, ( - ) double vision, ( - ) watery eyes Ears, nose, mouth, throat, and face: ( - ) mucositis, ( - ) sore throat Respiratory: ( - ) cough, ( - ) dyspnea, ( - ) wheezes Cardiovascular: ( - ) palpitation, ( - ) chest discomfort, ( - ) lower extremity swelling Gastrointestinal:  ( - ) nausea, ( - ) heartburn, ( + ) change in bowel habits Skin: ( - ) abnormal skin rashes Lymphatics: ( - ) new lymphadenopathy, ( - ) easy bruising Neurological: ( - ) numbness, ( - ) tingling, ( - )  new weaknesses Behavioral/Psych: ( - ) mood change, ( - ) new changes  All other systems were reviewed with the patient and are negative.  PHYSICAL EXAMINATION: ECOG PERFORMANCE STATUS: 0 - Asymptomatic  Vitals:   08/23/23 0956  BP: (!) 170/93  Pulse: 70  Resp: 13  Temp: (!) 97.3 F (36.3 C)  SpO2: 100%    Filed Weights   08/23/23 0956  Weight: 217 lb 14.4 oz (98.8 kg)     GENERAL: well appearing male in NAD  SKIN: skin color, texture, turgor are normal, no rashes or significant lesions EYES: conjunctiva are pink and non-injected, sclera clear LUNGS: clear to auscultation and percussion with normal breathing effort HEART: regular rate & rhythm and no murmurs and no lower extremity edema Musculoskeletal: no cyanosis of digits and no clubbing  PSYCH: alert & oriented x 3, fluent speech NEURO: no focal motor/sensory deficits  LABORATORY DATA:  I have reviewed the data as listed    Latest Ref Rng & Units 08/12/2023    1:49 PM 09/30/2022    3:32 AM 09/29/2022    4:55 PM  CBC  WBC 4.0 - 10.5 K/uL 8.5  11.4  12.7   Hemoglobin 13.0 - 17.0 g/dL 25.3  66.4  40.3   Hematocrit 39.0 - 52.0 % 40.0  30.0  32.2   Platelets 150 - 400 K/uL 270  280  294        Latest Ref Rng & Units 08/12/2023    1:49 PM 09/30/2022    3:32 AM 09/29/2022    4:55 PM  CMP  Glucose 70 - 99 mg/dL 474  259    BUN 8 - 23 mg/dL 17  13    Creatinine 5.63 - 1.24 mg/dL 8.75  6.43  3.29   Sodium 135 - 145 mmol/L 138  132     Potassium 3.5 - 5.1 mmol/L 3.9  4.0    Chloride 98 - 111 mmol/L 101  99    CO2 22 - 32 mmol/L 28  22    Calcium 8.9 - 10.3 mg/dL 9.1  8.5    Total Protein 6.5 - 8.1 g/dL 7.7     Total Bilirubin 0.0 - 1.2 mg/dL 0.4     Alkaline Phos 38 - 126 U/L 58     AST 15 - 41 U/L 18     ALT 0 - 44 U/L 14       ASSESSMENT & PLAN Bryan Thompson is a 73 y.o. male who presents for a follow up for MGUS.    #IgM MGUS: --Initial workup from 01/15/2022 showed no measurable M-protein but immunofixation showed IgM monoclonal protein with lambda light chain specificity. Serum free light chains showed kappa 26.4, lambda 18.6, ratio 1.42.  PLAN: --Labs from 08/12/2023 showed no cytopenias. Creatinine slightly elevated at 1.25. Calcium levels normal. M protein not detected but IFE shows IgM monoclonal protein with lambda light chain specificity. Kappa light chain elevated at 31.8 with normal lambda light chain and ratio.  --Bone survey from 01/18/2022 showed no lytic lesions. Repeat yearly, due now.  --24 hour UPEP from 01/18/2022 was unremarkable. Repeat yearly, due now --No clinical symptoms or laboratory findings to suggest transformation to multiple myeloma --If M-spike is detected with IgM protein, then need to arrange for bone marrow biopsy  --Tentative plan to return in 1 year with labs and follow up.   Orders Placed This Encounter  Procedures   DG Bone Survey Met    Standing Status:  Future    Expected Date:   09/06/2023    Expiration Date:   08/22/2024    Reason for Exam (SYMPTOM  OR DIAGNOSIS REQUIRED):   evaluate for lytic lesion, MGUS    Preferred imaging location?:   [facility]   24-Hr Ur UPEP/UIFE/Light Chains/TP    Standing Status:   Future    Expiration Date:   08/22/2024    All questions were answered. The patient knows to call the clinic with any problems, questions or concerns.  I have spent a total of 30 minutes minutes of face-to-face and non-face-to-face time, preparing to see  the patient, performing a medically appropriate examination, counseling and educating the patient, documenting clinical information in the electronic health record, and care coordination.   Georga Kaufmann, PA-C Department of Hematology/Oncology [facility] at [facility] Phone: [phone]

## 2023-08-29 DIAGNOSIS — R5383 Other fatigue: Secondary | ICD-10-CM | POA: Diagnosis not present

## 2023-08-29 DIAGNOSIS — F32A Depression, unspecified: Secondary | ICD-10-CM | POA: Diagnosis not present

## 2023-08-29 DIAGNOSIS — Z803 Family history of malignant neoplasm of breast: Secondary | ICD-10-CM | POA: Diagnosis not present

## 2023-08-29 DIAGNOSIS — Z7902 Long term (current) use of antithrombotics/antiplatelets: Secondary | ICD-10-CM | POA: Diagnosis not present

## 2023-08-29 DIAGNOSIS — I10 Essential (primary) hypertension: Secondary | ICD-10-CM | POA: Diagnosis not present

## 2023-08-29 DIAGNOSIS — E785 Hyperlipidemia, unspecified: Secondary | ICD-10-CM | POA: Diagnosis not present

## 2023-08-29 DIAGNOSIS — K5900 Constipation, unspecified: Secondary | ICD-10-CM | POA: Diagnosis not present

## 2023-08-29 DIAGNOSIS — Z79899 Other long term (current) drug therapy: Secondary | ICD-10-CM | POA: Diagnosis not present

## 2023-08-29 DIAGNOSIS — Z888 Allergy status to other drugs, medicaments and biological substances status: Secondary | ICD-10-CM | POA: Diagnosis not present

## 2023-08-29 DIAGNOSIS — Z9089 Acquired absence of other organs: Secondary | ICD-10-CM | POA: Diagnosis not present

## 2023-08-29 DIAGNOSIS — D472 Monoclonal gammopathy: Secondary | ICD-10-CM | POA: Diagnosis not present

## 2023-08-29 DIAGNOSIS — I2510 Atherosclerotic heart disease of native coronary artery without angina pectoris: Secondary | ICD-10-CM | POA: Diagnosis not present

## 2023-08-29 DIAGNOSIS — Z809 Family history of malignant neoplasm, unspecified: Secondary | ICD-10-CM | POA: Diagnosis not present

## 2023-08-29 DIAGNOSIS — Z8249 Family history of ischemic heart disease and other diseases of the circulatory system: Secondary | ICD-10-CM | POA: Diagnosis not present

## 2023-08-29 DIAGNOSIS — Z8616 Personal history of COVID-19: Secondary | ICD-10-CM | POA: Diagnosis not present

## 2023-08-29 DIAGNOSIS — Z83719 Family history of colon polyps, unspecified: Secondary | ICD-10-CM | POA: Diagnosis not present

## 2023-08-30 ENCOUNTER — Ambulatory Visit (HOSPITAL_COMMUNITY)
Admission: RE | Admit: 2023-08-30 | Discharge: 2023-08-30 | Disposition: A | Discharge status: Home / Self Care | Source: Ambulatory Visit | Attending: Physician Assistant | Admitting: Physician Assistant

## 2023-08-30 ENCOUNTER — Other Ambulatory Visit: Payer: Self-pay

## 2023-08-30 DIAGNOSIS — Z0389 Encounter for observation for other suspected diseases and conditions ruled out: Secondary | ICD-10-CM | POA: Diagnosis not present

## 2023-08-30 DIAGNOSIS — D472 Monoclonal gammopathy: Secondary | ICD-10-CM | POA: Diagnosis not present

## 2023-08-31 ENCOUNTER — Other Ambulatory Visit: Payer: Self-pay | Admitting: Interventional Cardiology

## 2023-09-01 ENCOUNTER — Other Ambulatory Visit: Payer: Self-pay

## 2023-09-01 ENCOUNTER — Other Ambulatory Visit (HOSPITAL_BASED_OUTPATIENT_CLINIC_OR_DEPARTMENT_OTHER): Payer: Self-pay

## 2023-09-01 MED ORDER — METOPROLOL SUCCINATE ER 25 MG PO TB24
12.5000 mg | ORAL_TABLET | Freq: Every day | ORAL | 0 refills | Status: DC
  Filled 2023-09-01: qty 45, 90d supply, fill #0

## 2023-09-02 LAB — UPEP/UIFE/LIGHT CHAINS/TP, 24-HR UR
% BETA, Urine: 21.2 %
ALPHA 1 URINE: 5.6 %
Albumin, U: 31.6 %
Alpha 2, Urine: 20 %
Free Kappa Lt Chains,Ur: 6.04 mg/L (ref 1.17–86.46)
Free Kappa/Lambda Ratio: 5.7 (ref 1.83–14.26)
Free Lambda Lt Chains,Ur: 1.06 mg/L (ref 0.27–15.21)
GAMMA GLOBULIN URINE: 21.5 %
Total Protein, Urine-Ur/day: 140 mg/(24.h) (ref 30–150)
Total Protein, Urine: 4.6 mg/dL
Total Volume: 3050

## 2023-09-05 ENCOUNTER — Encounter: Payer: Self-pay | Admitting: Physician Assistant

## 2023-09-08 ENCOUNTER — Encounter: Payer: Self-pay | Admitting: Physician Assistant

## 2023-09-12 DIAGNOSIS — K5900 Constipation, unspecified: Secondary | ICD-10-CM | POA: Diagnosis not present

## 2023-09-12 DIAGNOSIS — I129 Hypertensive chronic kidney disease with stage 1 through stage 4 chronic kidney disease, or unspecified chronic kidney disease: Secondary | ICD-10-CM | POA: Diagnosis not present

## 2023-09-12 DIAGNOSIS — I25119 Atherosclerotic heart disease of native coronary artery with unspecified angina pectoris: Secondary | ICD-10-CM | POA: Diagnosis not present

## 2023-09-12 DIAGNOSIS — F331 Major depressive disorder, recurrent, moderate: Secondary | ICD-10-CM | POA: Diagnosis not present

## 2023-09-12 DIAGNOSIS — E039 Hypothyroidism, unspecified: Secondary | ICD-10-CM | POA: Diagnosis not present

## 2023-09-12 DIAGNOSIS — R413 Other amnesia: Secondary | ICD-10-CM | POA: Diagnosis not present

## 2023-10-10 ENCOUNTER — Encounter: Payer: Self-pay | Admitting: Cardiovascular Disease

## 2023-10-10 ENCOUNTER — Ambulatory Visit: Payer: Medicare HMO | Attending: Cardiovascular Disease | Admitting: Cardiovascular Disease

## 2023-10-10 DIAGNOSIS — I319 Disease of pericardium, unspecified: Secondary | ICD-10-CM | POA: Diagnosis not present

## 2023-10-10 DIAGNOSIS — I238 Other current complications following acute myocardial infarction: Secondary | ICD-10-CM | POA: Diagnosis not present

## 2023-10-10 DIAGNOSIS — I249 Acute ischemic heart disease, unspecified: Secondary | ICD-10-CM | POA: Diagnosis not present

## 2023-10-10 DIAGNOSIS — I471 Supraventricular tachycardia, unspecified: Secondary | ICD-10-CM

## 2023-10-10 DIAGNOSIS — E785 Hyperlipidemia, unspecified: Secondary | ICD-10-CM | POA: Diagnosis not present

## 2023-10-10 DIAGNOSIS — E039 Hypothyroidism, unspecified: Secondary | ICD-10-CM

## 2023-10-10 DIAGNOSIS — G4752 REM sleep behavior disorder: Secondary | ICD-10-CM

## 2023-10-10 DIAGNOSIS — I2510 Atherosclerotic heart disease of native coronary artery without angina pectoris: Secondary | ICD-10-CM | POA: Diagnosis not present

## 2023-10-10 DIAGNOSIS — D472 Monoclonal gammopathy: Secondary | ICD-10-CM

## 2023-10-10 MED ORDER — METOPROLOL SUCCINATE ER 25 MG PO TB24: 12.5000 mg | ORAL_TABLET | Freq: Every day | ORAL | 3 refills | Status: DC

## 2023-10-10 NOTE — Progress Notes (Signed)
 Cardiology Office Note    Date:  10/12/2023   ID:  Bryan Thompson, Bryan Thompson 1950/08/05, MRN [id number]  PCP:  Lonzie Robins, MD  Cardiologist:  Magnus Schuller, MD, former patient of Dr. Jacquelynn Matter  Initial office visit with me  History of Present Illness:  Bryan Thompson is a 73 y.o. male who has a history of hypertension, hyperlipidemia with prior statin intolerance, MGUS followed at the cancer center, hypothyroidism, and depression.  He developed significant chest pain leading to hospitalization on Sep 29, 2022.  He initially presented to [facility], was started on heparin , initial troponin was elevated at 7851 and he was transferred to [facility].  Subsequent ECG showed early 1 mm ST elevation in leads I, aVL, V4 through V6.  An echo Doppler study showed EF 50 to 55% with inferolateral hypokinesis.  He was seen by Dr. Alric Asp and sent for urgent coronary catheterization performed by me on Sep 29, 2022.  Catheterization confirmed an acute coronary syndrome secondary secondary to subtotal 99% left circumflex stenosis with significant thrombus burden.  There was mild concomitant CAD with 30% LAD stenosis, 40% proximal ramus stenosis, and 30 and 20% mid RCA stenosis and a dominant RCA.  He underwent successful PCI to the circumflex with Pronto thrombectomy and ultimate insertion of a 2.5 x 20 mm Synergy stent and restoration of TIMI-3 flow.  He developed subsequent pericarditis and was treated with colchicine .  Subsequent to his hospitalization, he was seen by Faustina Hood, PA-C on Oct 12, 2018 for and more recently in follow-up by Dr. Jacquelynn Matter on December 27, 2022.  Apparently he could not afford Brilinta  and this he was changed to Effient .  Presently, he feels well and denies recurrent chest pain.  He has a history of disruptive sleep and recently had fallen out of bed acutely earlier this year and required 8 staples at the top of his head.  Apparently, he had acted out vivid dreams  and there is concern for possible REM related sleep disorder and has been seen by Dr Omar Bibber at [facility] neurology.  He has a history of hypothyroidism and apparently was over suppressed and recently his thyroid  dose had been reduced to 100 mcg with benefit.  With Dr. Willey Harrier departure from our group, he presents to my office today for cardiology follow-up evaluation.   Past Medical History:  Diagnosis Date   Coronary artery disease    COVID-19    Depression    Hernia, umbilical    Hyperlipidemia    Hypertension    Thyroid  disease     Past Surgical History:  Procedure Laterality Date   CATARACT EXTRACTION     CORONARY STENT INTERVENTION N/A 09/29/2022   Procedure: CORONARY STENT INTERVENTION;  Surgeon: Millicent Ally, MD;  Location: [facility] INVASIVE CV LAB;  Service: Cardiovascular;  Laterality: N/A;   CORONARY THROMBECTOMY N/A 09/29/2022   Procedure: Coronary Thrombectomy;  Surgeon: Millicent Ally, MD;  Location: [facility] INVASIVE CV LAB;  Service: Cardiovascular;  Laterality: N/A;   LEFT HEART CATH AND CORONARY ANGIOGRAPHY N/A 09/29/2022   Procedure: LEFT HEART CATH AND CORONARY ANGIOGRAPHY;  Surgeon: Millicent Ally, MD;  Location: [facility] INVASIVE CV LAB;  Service: Cardiovascular;  Laterality: N/A;   TONSILLECTOMY      Current Medications: Outpatient Medications Prior to Visit  Medication Sig Dispense Refill   acetaminophen  (TYLENOL ) 500 MG tablet Take 1,000 mg by mouth every 8 (eight) hours as needed for moderate pain, fever  or headache.     busPIRone (BUSPAR) 5 MG tablet Take 5 mg by mouth 2 (two) times daily.     Evolocumab  (REPATHA  SURECLICK) 140 MG/ML SOAJ Inject 140 mg into the skin every 14 (fourteen) days. 6 mL 3   Multiple Vitamins-Minerals (CENTRUM SILVER PO) Take 1 tablet by mouth daily in the afternoon.     prasugrel  (EFFIENT ) 10 MG TABS tablet Take 1 tablet (10 mg total) by mouth daily. 90 tablet 2   sertraline  (ZOLOFT ) 50 MG tablet 50 mg daily.     SYNTHROID  112 MCG tablet Take 112  mcg by mouth daily before breakfast.     vitamin B-12 (CYANOCOBALAMIN) 1000 MCG tablet Take 1,000 mcg by mouth daily in the afternoon.     aspirin  EC 81 MG tablet Take 1 tablet (81 mg total) by mouth daily. Swallow whole. 30 tablet 12   metoprolol  succinate (TOPROL -XL) 25 MG 24 hr tablet Take 0.5 tablets (12.5 mg total) by mouth daily. Pt must keep upcoming app in May 2025 with new Cardiologist Dr. Loetta Ringer before anymore refills. Thank you Final Attempt 45 tablet 0   nitroGLYCERIN  (NITROSTAT ) 0.4 MG SL tablet Place 1 tablet (0.4 mg total) under the tongue every 5 (five) minutes as needed for chest pain. 25 tablet 3   No facility-administered medications prior to visit.     Allergies:   Fish allergy, Statins, and Prednisone   Social History   Socioeconomic History   Marital status: Married    Spouse name: Not on file   Number of children: Not on file   Years of education: Not on file   Highest education level: Not on file  Occupational History   Occupation: retired  Tobacco Use   Smoking status: Never   Smokeless tobacco: Never  Vaping Use   Vaping status: Never Used  Substance and Sexual Activity   Alcohol  use: Not Currently   Drug use: Never   Sexual activity: Not on file  Other Topics Concern   Not on file  Social History Narrative   Not on file   Social Drivers of Health   Financial Resource Strain: Not on file  Food Insecurity: No Food Insecurity (09/29/2022)   Hunger Vital Sign    Worried About Running Out of Food in the Last Year: Never true    Ran Out of Food in the Last Year: Never true  Transportation Needs: No Transportation Needs (09/29/2022)   PRAPARE - Administrator, Civil Service (Medical): No    Lack of Transportation (Non-Medical): No  Physical Activity: Not on file  Stress: Not on file  Social Connections: Not on file    Socially he is married for 46 years.  He was born in [place] and lives in [place].  He has 3 children and no  grandchildren.  Family History:  The patient's family history includes Cancer in his mother; Colon polyps in his father; Heart disease in his mother; Hypertension in his father.   ROS General: Negative; No fevers, chills, or night sweats;  HEENT: Negative; No changes in vision or hearing, sinus congestion, difficulty swallowing Pulmonary: Negative; No cough, wheezing, shortness of breath, hemoptysis Cardiovascular: Negative; No chest pain, presyncope, syncope, palpitations GI: Negative; No nausea, vomiting, diarrhea, or abdominal pain GU: Negative; No dysuria, hematuria, or difficulty voiding Musculoskeletal: Negative; no myalgias, joint pain, or weakness Hematologic/Oncology: Negative; no easy bruising, bleeding Endocrine: Negative; no heat/cold intolerance; no diabetes Neuro: Negative; no changes in balance, headaches Skin: Negative; No  rashes or skin lesions Psychiatric: Negative; No behavioral problems, depression Sleep: Vivid dreams, vocalization, acting out, concern for REM related behavior disorder; was seen by Dr. Omar Bibber.  A sleep study was to be performed, but the patient had never heard anything subsequently and this had not been scheduled   Other comprehensive 14 point system review is negative.   PHYSICAL EXAM:   VS:  BP 120/74   Pulse 61   Ht 5\' 10"  (1.778 m)   Wt 217 lb 12.8 oz (98.8 kg)   SpO2 96%   BMI 31.25 kg/m     Repeat blood pressure by me was 128/76  Wt Readings from Last 3 Encounters:  10/10/23 217 lb 12.8 oz (98.8 kg)  08/23/23 217 lb 14.4 oz (98.8 kg)  07/11/23 221 lb (100.2 kg)    General: Alert, oriented, no distress.  Skin: normal turgor, no rashes, warm and dry HEENT: Normocephalic, atraumatic. Pupils equal round and reactive to light; sclera anicteric; extraocular muscles intact;  Nose without nasal septal hypertrophy Mouth/Parynx benign; Mallinpatti scale 3 Neck: No JVD, no carotid bruits; normal carotid upstroke Lungs: clear to ausculatation  and percussion; no wheezing or rales Chest wall: without tenderness to palpitation Heart: PMI not displaced, RRR, s1 s2 normal, 1/6 systolic murmur, no diastolic murmur, no rubs, gallops, thrills, or heaves Abdomen: soft, nontender; no hepatosplenomehaly, BS+; abdominal aorta nontender and not dilated by palpation. Back: no CVA tenderness Pulses 2+ Musculoskeletal: full range of motion, normal strength, no joint deformities Extremities: no clubbing cyanosis or edema, Homan's sign negative  Neurologic: grossly nonfocal; Cranial nerves grossly wnl Psychologic: Normal mood and affect   Studies/Labs Reviewed:   EKG Interpretation Date/Time:  Monday Oct 10 2023 10:37:16 EDT Ventricular Rate:  61 PR Interval:  178 QRS Duration:  100 QT Interval:  426 QTC Calculation: 428 R Axis:   -4  Text Interpretation: Normal sinus rhythm Normal ECG When compared with ECG of 27-Dec-2022 08:44, Borderline criteria for Lateral infarct are no longer Present T wave inversion no longer evident in Inferior leads Confirmed by Magnus Schuller ([id number]) on 10/12/2023 8:21:58 PM    Recent Labs:    Latest Ref Rng & Units 08/12/2023    1:49 PM 09/30/2022    3:32 AM 09/29/2022    4:55 PM  BMP  Glucose 70 - 99 mg/dL 528  413    BUN 8 - 23 mg/dL 17  13    Creatinine 2.44 - 1.24 mg/dL 0.10  2.72  5.36   Sodium 135 - 145 mmol/L 138  132    Potassium 3.5 - 5.1 mmol/L 3.9  4.0    Chloride 98 - 111 mmol/L 101  99    CO2 22 - 32 mmol/L 28  22    Calcium  8.9 - 10.3 mg/dL 9.1  8.5          Latest Ref Rng & Units 08/12/2023    1:49 PM 09/29/2022    4:19 AM 07/20/2022    9:31 AM  Hepatic Function  Total Protein 6.5 - 8.1 g/dL 7.7  7.2  7.6   Albumin 3.5 - 5.0 g/dL 4.1  3.2  4.3   AST 15 - 41 U/L 18  39  20   ALT 0 - 44 U/L 14  28  15    Alk Phosphatase 38 - 126 U/L 58  64  57   Total Bilirubin 0.0 - 1.2 mg/dL 0.4  0.4  0.5        Latest Ref Rng &  Units 08/12/2023    1:49 PM 09/30/2022    3:32 AM 09/29/2022    4:55 PM   CBC  WBC 4.0 - 10.5 K/uL 8.5  11.4  12.7   Hemoglobin 13.0 - 17.0 g/dL 16.1  09.6  04.5   Hematocrit 39.0 - 52.0 % 40.0  30.0  32.2   Platelets 150 - 400 K/uL 270  280  294    Lab Results  Component Value Date   MCV 90.7 08/12/2023   MCV 91.2 09/30/2022   MCV 93.6 09/29/2022   Lab Results  Component Value Date   TSH 1.187 09/29/2022   Lab Results  Component Value Date   HGBA1C 6.3 (H) 09/29/2022     BNP No results found for: "BNP"  ProBNP No results found for: "PROBNP"   Lipid Panel     Component Value Date/Time   CHOL 181 12/16/2022 0931   TRIG 173 (H) 12/16/2022 0931   HDL 29 (L) 12/16/2022 0931   CHOLHDL 6.2 (H) 12/16/2022 0931   CHOLHDL 4.6 09/29/2022 1251   VLDL 24 09/29/2022 1251   LDLCALC 121 (H) 12/16/2022 0931   LABVLDL 31 12/16/2022 0931     RADIOLOGY: No results found.   Additional studies/ records that were reviewed today include:   CATH/PCI: 09/29/2022   Ramus lesion is 40% stenosed.   Prox RCA lesion is 30% stenosed.   Mid RCA lesion is 20% stenosed.   Mid Cx to Dist Cx lesion is 99% stenosed.   Prox LAD to Mid LAD lesion is 30% stenosed.   A drug-eluting stent was successfully placed.   Post intervention, there is a 0% residual stenosis.   Acute coronary syndrome secondary to total 99% left circumflex stenosis with significant thrombus burden.   Mild concomitant CAD with 30% AT stenosis, 40% proximal ramus intermediate stenosis, and 30 and 20% mid RCA stenosis and a dominant RCA.   LV EDP 28 mm Hg.   Successful PCI to the left circumflex vessel with Pronto thrombectomy and ultimate insertion of a 2.5 x 20 mm Synergy DES stent postdilated to 2.71 mm with transient no flow and ultimate restoration of brisk TIMI-3 flow with the 99% stenosis being reduced to 0%.   RECOMMENDATION: DAPT with aspirin /Brilinta  for minimum of 12 months.  Continue Aggrastat  for 18 hours post procedure.  Aggressive lipid-lowering therapy with trial of statin  rechallenge.  If unable to take statin, initiate PCSK9 inhibition.  Medical therapy for concomitant CAD.     Intervention     ASSESSMENT:    1. ACS (acute coronary syndrome) (HCC): 09/29/2022, PCI to LCX   2. Coronary artery disease involving native coronary artery of native heart without angina pectoris   3. SVT (supraventricular tachycardia) (HCC)   4. Pericarditis as complication of acute myocardial infarction (HCC)   5. Hyperlipidemia with target low density lipoprotein (LDL) cholesterol less than 55 mg/dL   6. Sleep behavior disorder, REM   7. Monoclonal gammopathies   8. Hypothyroidism, unspecified type     PLAN:  Bryan Thompson is a 73 year old gentleman who developed an acute coronary syndrome and underwent urgent cardiac catheterization on Sep 29, 2022.  She was found to have subtotal circumflex stenosis and had mild concomitant CAD involving his LAD, ramus intermediate, and RCA.  He underwent successful PCI to his circumflex vessel.  Post MI course was complicated by pericarditis and he received several months of colchicine .  Apparently due to cost, Brilinta  was switched to Effient  and he has  continued to be on aspirin  and Effient  10 mg for dual antiplatelet therapy.  He is now over 1 year since his intervention.  He admits to significant easy bruisability and as result I will discontinue aspirin  and he will continue generic prasugrel . Presently he has been without anginal symptomatology.  He is on metoprolol  succinate 12.5 mg daily.  Currently he is on Repatha  with history of statin intolerance.  He has a history of hypothyroidism on levothyroxine  100 mcg which recently had been reduced from higher dosing.  He has issues with sleep and there is concern for possible REM related sleep disorder.  If present, this can be associated with future neurologic issues and apparently he had undergone evaluation by Dr. Omar Bibber.  Apparently a sleep study was supposed to be performed but apparently he  never heard any follow-up.  I have recommended he contact Dr. Dail Drought office so that they can schedule the sleep study.  I have discussed with him my retirement next month.  As result I will transition him to the cardiology care of Dr. Randene Bustard with follow-up in 6 months.  He will also follow-up with Dr. Omar Bibber at [facility] neurology.  He sees Dr. Avva for primary care.   Medication Adjustments/Labs and Tests Ordered: Current medicines are reviewed at length with the patient today.  Concerns regarding medicines are outlined above.  Medication changes, Labs and Tests ordered today are listed in the Patient Instructions below. Patient Instructions  Medication Instructions:  STOP ASPIRIN   *If you need a refill on your cardiac medications before your next appointment, please call your pharmacy*  Lab Work: NO LABS If you have labs (blood work) drawn today and your tests are completely normal, you will receive your results only by: MyChart Message (if you have MyChart) OR A paper copy in the mail If you have any lab test that is abnormal or we need to change your treatment, we will call you to review the results.  Testing/Procedures: NO TESTING  Follow-Up: At [facility] you and your health needs are our priority.  As part of our continuing mission to provide you with exceptional heart care, our providers are all part of one team.  This team includes your primary Cardiologist (physician) and Advanced Practice Providers or APPs (Physician Assistants and Nurse Practitioners) who all work together to provide you with the care you need, when you need it.  Your next appointment:   6 month(s)  Provider:   Randene Bustard, MD   Signed, Magnus Schuller, MD  10/12/2023 8:44 PM    [facility] [address], [address], [place], [place]  [address] Phone: [phone]

## 2023-10-10 NOTE — Patient Instructions (Addendum)
 Medication Instructions:  STOP ASPIRIN   *If you need a refill on your cardiac medications before your next appointment, please call your pharmacy*  Lab Work: NO LABS If you have labs (blood work) drawn today and your tests are completely normal, you will receive your results only by: MyChart Message (if you have MyChart) OR A paper copy in the mail If you have any lab test that is abnormal or we need to change your treatment, we will call you to review the results.  Testing/Procedures: NO TESTING  Follow-Up: At [facility], you and your health needs are our priority.  As part of our continuing mission to provide you with exceptional heart care, our providers are all part of one team.  This team includes your primary Cardiologist (physician) and Advanced Practice Providers or APPs (Physician Assistants and Nurse Practitioners) who all work together to provide you with the care you need, when you need it.  Your next appointment:   6 month(s)  Provider:   Randene Bustard, MD

## 2023-10-12 ENCOUNTER — Encounter: Payer: Self-pay | Admitting: Cardiovascular Disease

## 2023-10-12 ENCOUNTER — Other Ambulatory Visit: Payer: Self-pay

## 2023-10-13 ENCOUNTER — Other Ambulatory Visit (HOSPITAL_BASED_OUTPATIENT_CLINIC_OR_DEPARTMENT_OTHER): Payer: Self-pay

## 2023-10-13 ENCOUNTER — Other Ambulatory Visit: Payer: Self-pay

## 2023-10-28 DIAGNOSIS — H0288B Meibomian gland dysfunction left eye, upper and lower eyelids: Secondary | ICD-10-CM | POA: Diagnosis not present

## 2023-10-28 DIAGNOSIS — H0288A Meibomian gland dysfunction right eye, upper and lower eyelids: Secondary | ICD-10-CM | POA: Diagnosis not present

## 2023-10-28 DIAGNOSIS — Z961 Presence of intraocular lens: Secondary | ICD-10-CM | POA: Diagnosis not present

## 2023-11-08 DIAGNOSIS — I25119 Atherosclerotic heart disease of native coronary artery with unspecified angina pectoris: Secondary | ICD-10-CM | POA: Diagnosis not present

## 2023-11-08 DIAGNOSIS — E039 Hypothyroidism, unspecified: Secondary | ICD-10-CM | POA: Diagnosis not present

## 2023-11-30 DIAGNOSIS — R062 Wheezing: Secondary | ICD-10-CM | POA: Diagnosis not present

## 2023-11-30 DIAGNOSIS — J3489 Other specified disorders of nose and nasal sinuses: Secondary | ICD-10-CM | POA: Diagnosis not present

## 2023-11-30 DIAGNOSIS — R5383 Other fatigue: Secondary | ICD-10-CM | POA: Diagnosis not present

## 2023-11-30 DIAGNOSIS — R058 Other specified cough: Secondary | ICD-10-CM | POA: Diagnosis not present

## 2023-11-30 DIAGNOSIS — J209 Acute bronchitis, unspecified: Secondary | ICD-10-CM | POA: Diagnosis not present

## 2023-11-30 DIAGNOSIS — Z1152 Encounter for screening for COVID-19: Secondary | ICD-10-CM | POA: Diagnosis not present

## 2023-12-05 ENCOUNTER — Telehealth: Payer: Self-pay | Admitting: Cardiovascular Disease

## 2023-12-05 MED ORDER — REPATHA SURECLICK 140 MG/ML ~~LOC~~ SOAJ: 140.0000 mg | SUBCUTANEOUS | 3 refills | Status: AC

## 2023-12-05 NOTE — Telephone Encounter (Signed)
 Pt is requesting a callback regarding him needing a note saying it's ok for him to be seen at the dentist since he had a heart attack last year. Please advise

## 2023-12-05 NOTE — Telephone Encounter (Signed)
*  STAT* If patient is at the pharmacy, call can be transferred to refill team.   1. Which medications need to be refilled? (please list name of each medication and dose if known)   Evolocumab  (REPATHA  SURECLICK) 140 MG/ML SOAJ    2. Which pharmacy/location (including street and city if local pharmacy) is medication to be sent to?  [id number] [place], [place] - [address]      3. Do they need a 30 day or 90 day supply? 90 day    Pt is out of medication

## 2023-12-13 ENCOUNTER — Telehealth: Payer: Self-pay | Admitting: Cardiology

## 2023-12-13 ENCOUNTER — Emergency Department (HOSPITAL_BASED_OUTPATIENT_CLINIC_OR_DEPARTMENT_OTHER)
Admission: EM | Admit: 2023-12-13 | Discharge: 2023-12-14 | Disposition: A | Discharge status: Home / Self Care | Attending: Emergency Medicine | Admitting: Emergency Medicine

## 2023-12-13 ENCOUNTER — Emergency Department (HOSPITAL_BASED_OUTPATIENT_CLINIC_OR_DEPARTMENT_OTHER)

## 2023-12-13 ENCOUNTER — Telehealth: Payer: Self-pay

## 2023-12-13 ENCOUNTER — Other Ambulatory Visit: Payer: Self-pay

## 2023-12-13 ENCOUNTER — Encounter (HOSPITAL_BASED_OUTPATIENT_CLINIC_OR_DEPARTMENT_OTHER): Payer: Self-pay

## 2023-12-13 DIAGNOSIS — I493 Ventricular premature depolarization: Secondary | ICD-10-CM | POA: Diagnosis not present

## 2023-12-13 DIAGNOSIS — I6782 Cerebral ischemia: Secondary | ICD-10-CM | POA: Diagnosis not present

## 2023-12-13 DIAGNOSIS — Z8616 Personal history of COVID-19: Secondary | ICD-10-CM | POA: Diagnosis not present

## 2023-12-13 DIAGNOSIS — R42 Dizziness and giddiness: Secondary | ICD-10-CM | POA: Diagnosis not present

## 2023-12-13 DIAGNOSIS — I2510 Atherosclerotic heart disease of native coronary artery without angina pectoris: Secondary | ICD-10-CM | POA: Insufficient documentation

## 2023-12-13 DIAGNOSIS — I951 Orthostatic hypotension: Secondary | ICD-10-CM | POA: Insufficient documentation

## 2023-12-13 DIAGNOSIS — R0789 Other chest pain: Secondary | ICD-10-CM | POA: Diagnosis not present

## 2023-12-13 DIAGNOSIS — I252 Old myocardial infarction: Secondary | ICD-10-CM | POA: Diagnosis not present

## 2023-12-13 DIAGNOSIS — R008 Other abnormalities of heart beat: Secondary | ICD-10-CM | POA: Insufficient documentation

## 2023-12-13 DIAGNOSIS — R55 Syncope and collapse: Secondary | ICD-10-CM

## 2023-12-13 DIAGNOSIS — I10 Essential (primary) hypertension: Secondary | ICD-10-CM | POA: Insufficient documentation

## 2023-12-13 DIAGNOSIS — J329 Chronic sinusitis, unspecified: Secondary | ICD-10-CM | POA: Diagnosis not present

## 2023-12-13 LAB — CBC WITH DIFFERENTIAL/PLATELET
Abs Immature Granulocytes: 0.02 K/uL (ref 0.00–0.07)
Basophils Absolute: 0.1 K/uL (ref 0.0–0.1)
Basophils Relative: 1 %
Eosinophils Absolute: 0.4 K/uL (ref 0.0–0.5)
Eosinophils Relative: 4 %
HCT: 36.5 % — ABNORMAL LOW (ref 39.0–52.0)
Hemoglobin: 13.1 g/dL (ref 13.0–17.0)
Immature Granulocytes: 0 %
Lymphocytes Relative: 32 %
Lymphs Abs: 3.1 K/uL (ref 0.7–4.0)
MCH: 32.3 pg (ref 26.0–34.0)
MCHC: 35.9 g/dL (ref 30.0–36.0)
MCV: 90.1 fL (ref 80.0–100.0)
Monocytes Absolute: 1.1 K/uL — ABNORMAL HIGH (ref 0.1–1.0)
Monocytes Relative: 11 %
Neutro Abs: 5.1 K/uL (ref 1.7–7.7)
Neutrophils Relative %: 52 %
Platelets: 273 K/uL (ref 150–400)
RBC: 4.05 MIL/uL — ABNORMAL LOW (ref 4.22–5.81)
RDW: 12.5 % (ref 11.5–15.5)
WBC: 9.7 K/uL (ref 4.0–10.5)
nRBC: 0 % (ref 0.0–0.2)

## 2023-12-13 LAB — CBG MONITORING, ED: Glucose-Capillary: 136 mg/dL — ABNORMAL HIGH (ref 70–99)

## 2023-12-13 LAB — BASIC METABOLIC PANEL WITH GFR
Anion gap: 14 (ref 5–15)
BUN: 18 mg/dL (ref 8–23)
CO2: 23 mmol/L (ref 22–32)
Calcium: 9.1 mg/dL (ref 8.9–10.3)
Chloride: 97 mmol/L — ABNORMAL LOW (ref 98–111)
Creatinine, Ser: 1.59 mg/dL — ABNORMAL HIGH (ref 0.61–1.24)
GFR, Estimated: 45 mL/min — ABNORMAL LOW (ref >60)
Glucose, Bld: 155 mg/dL — ABNORMAL HIGH (ref 70–99)
Potassium: 3.6 mmol/L (ref 3.5–5.1)
Sodium: 135 mmol/L (ref 135–145)

## 2023-12-13 LAB — TROPONIN T, HIGH SENSITIVITY: Troponin T High Sensitivity: 16 ng/L (ref <19)

## 2023-12-13 LAB — PRO BRAIN NATRIURETIC PEPTIDE: Pro Brain Natriuretic Peptide: 366 pg/mL — ABNORMAL HIGH (ref <300.0)

## 2023-12-13 MED ORDER — SODIUM CHLORIDE 0.9 % IV BOLUS
500.0000 mL | Freq: Once | INTRAVENOUS | Status: AC
  Administered 2023-12-14: 500 mL via INTRAVENOUS

## 2023-12-13 NOTE — Telephone Encounter (Signed)
 Patient dropped of a Medical Clearance request.  I am faxing it to the Pre-Op fax for review and form completion.  Thank you.

## 2023-12-13 NOTE — ED Provider Triage Note (Signed)
 Emergency Medicine Provider Triage Evaluation Note  Bryan Thompson , a 73 y.o. male  was evaluated in triage.  Pt complains of dizziness.  Review of Systems  Positive: Dizziness, vague feeling in his chest Negative: Chest pain, shortness of breath  Physical Exam  BP 131/78 (BP Location: Left Arm)   Pulse 74   Temp 98.4 F (36.9 C)   Resp 20   Ht 5' 10 (1.778 m)   Wt 97.5 kg   SpO2 99%   BMI 30.85 kg/m  Gen:   Awake, no distress    Resp:  Normal effort   MSK:   Moves extremities without difficulty   Other:     Medical Decision Making  Medically screening exam initiated at 10:49 PM.  Appropriate orders placed.  Bryan Thompson was informed that the remainder of the evaluation will be completed by another provider, this initial triage assessment does not replace that evaluation, and the importance of remaining in the ED until their evaluation is complete.  Patient presents with dizziness and some vague feeling in his chest where he says he feels like his heart is going to stop.  Denies any actual pain in his chest.  He is having a lot of ectopy.  In triage his heart rate was noted to drop down into the 20s at 1 point although this was on a pulse ox, not telemetry.  Will order labs and continue monitoring patient on the monitor.   Bryan Hollering, MD 12/13/23 2250

## 2023-12-13 NOTE — Telephone Encounter (Signed)
 Unless he has had valve prosthesis placed for prior endocarditis, no need for antibiotics.  If he was on Repatha , I am fine with that being refilled.  Alm Clay, MD

## 2023-12-13 NOTE — ED Triage Notes (Addendum)
 Pt states he has not felt good this week, lightheaded.  Just finished Doxycycline  for a sinus infection.   At times he has felt like he was going to pass out.  HR is irregular from 30s to 74. Pt did have MI in May 2024 with 1 stent Pt is alert and oriented EDP in triage assessing pt

## 2023-12-13 NOTE — Telephone Encounter (Signed)
   Primary Cardiologist: Alm Clay, MD  Chart reviewed as part of pre-operative protocol coverage. Simple dental extractions (1-2 teeth), cleanings, and crowns are considered low risk procedures per guidelines and generally do not require any specific cardiac clearance. It is also generally accepted that for simple extractions and dental cleanings, there is no need to interrupt blood thinner therapy.   SBE prophylaxis is not required for the patient.  I will route this recommendation to the requesting party via Epic fax function and remove from pre-op pool.  Please call with questions.  Rosaline EMERSON Bane, NP-C  12/13/2023, 3:23 PM [address] [place], [place] [address] Office [phone] Fax [phone]

## 2023-12-13 NOTE — Telephone Encounter (Signed)
 Will keep a look out for preop clearance request via onbase

## 2023-12-13 NOTE — Telephone Encounter (Signed)
   Pre-operative Risk Assessment    Patient Name: Bryan Thompson  DOB: March 01, 1951 MRN: [id number]   Date of last office visit: 10/10/23 Date of next office visit: 04/25/24  Request for Surgical Clearance    Procedure:  Dental Cleansing (simple)  Date of Surgery:  Clearance TBD                                Surgeon:  Not provided Surgeon's Group or Practice Name:  [facility] Phone number:  [phone] Fax number:  [phone]   Type of Clearance Requested:   - Medical    Type of Anesthesia:  Not Indicated   Additional requests/questions:    Bonney Ival LOISE Gerome   12/13/2023, 2:05 PM

## 2023-12-14 LAB — URINALYSIS, ROUTINE W REFLEX MICROSCOPIC
Bilirubin Urine: NEGATIVE
Glucose, UA: NEGATIVE mg/dL
Ketones, ur: NEGATIVE mg/dL
Leukocytes,Ua: NEGATIVE
Nitrite: NEGATIVE
Protein, ur: NEGATIVE mg/dL
Specific Gravity, Urine: 1.01 (ref 1.005–1.030)
pH: 6 (ref 5.0–8.0)

## 2023-12-14 LAB — URINALYSIS, MICROSCOPIC (REFLEX)

## 2023-12-14 LAB — TROPONIN T, HIGH SENSITIVITY: Troponin T High Sensitivity: 16 ng/L (ref <19)

## 2023-12-14 NOTE — ED Provider Notes (Signed)
 [facility] Provider Note   CSN: [id number] Arrival date & time: 12/13/23  2219     Patient presents with: Dizziness and Near Syncope   Bryan Thompson is a 73 y.o. male.   The history is provided by the patient and the spouse.  Dizziness Quality:  Lightheadedness Severity:  Moderate Onset quality:  Sudden Timing:  Constant Progression:  Resolved Chronicity:  Recurrent Context: physical activity and standing up   Relieved by:  Nothing Worsened by:  Nothing Ineffective treatments:  None tried Associated symptoms: no chest pain, no diarrhea, no nausea, no shortness of breath, no tinnitus, no vision changes, no vomiting and no weakness   Risk factors: no anemia and no hx of vertigo   Patient with HTN and thyroid  disease has had multiple episodes of lightheadedness most recently with standing and ambulation this evening.      Past Medical History:  Diagnosis Date   Coronary artery disease    COVID-19    Depression    Hernia, umbilical    Hyperlipidemia    Hypertension    MI (myocardial infarction) (HCC) 09/29/2022   Thyroid  disease      Prior to Admission medications   Medication Sig Start Date End Date Taking? Authorizing Provider  acetaminophen  (TYLENOL ) 500 MG tablet Take 1,000 mg by mouth every 8 (eight) hours as needed for moderate pain, fever or headache.    [provider]  busPIRone (BUSPAR) 5 MG tablet Take 5 mg by mouth 2 (two) times daily. 02/01/23   [provider]  Evolocumab  (REPATHA  SURECLICK) 140 MG/ML SOAJ Inject 140 mg into the skin every 14 (fourteen) days. 12/05/23   Anner Alm ORN, MD  Multiple Vitamins-Minerals (CENTRUM SILVER PO) Take 1 tablet by mouth daily in the afternoon.    [provider]  nitroGLYCERIN  (NITROSTAT ) 0.4 MG SL tablet Place 1 tablet (0.4 mg total) under the tongue every 5 (five) minutes as needed for chest pain. 10/12/22 03/25/23  Parthenia Olivia CHRISTELLA, PA-C   prasugrel  (EFFIENT ) 10 MG TABS tablet Take 1 tablet (10 mg total) by mouth daily. 03/23/23   Dann Candyce RAMAN, MD  sertraline  (ZOLOFT ) 50 MG tablet 50 mg daily.    [provider]  SYNTHROID  112 MCG tablet Take 112 mcg by mouth daily before breakfast.    [provider]  vitamin B-12 (CYANOCOBALAMIN) 1000 MCG tablet Take 1,000 mcg by mouth daily in the afternoon.    [provider]    Allergies: Fish allergy, Statins, and Prednisone    Review of Systems  Constitutional:  Negative for fever.  HENT:  Negative for tinnitus.   Respiratory:  Negative for shortness of breath, wheezing and stridor.   Cardiovascular:  Negative for chest pain and leg swelling.  Gastrointestinal:  Negative for diarrhea, nausea and vomiting.  Neurological:  Positive for light-headedness. Negative for dizziness, tremors, syncope, facial asymmetry, speech difficulty and weakness.  All other systems reviewed and are negative.   Updated Vital Signs BP (!) 176/83   Pulse 64   Temp 98.4 F (36.9 C)   Resp 19   Ht 5' 10 (1.778 m)   Wt 97.5 kg   SpO2 96%   BMI 30.85 kg/m   Physical Exam Vitals and nursing note reviewed.  Constitutional:      General: He is not in acute distress.    Appearance: Normal appearance. He is well-developed. He is not diaphoretic.  HENT:     Head: Normocephalic  and atraumatic.     Nose: Nose normal.  Eyes:     Conjunctiva/sclera: Conjunctivae normal.     Pupils: Pupils are equal, round, and reactive to light.  Cardiovascular:     Rate and Rhythm: Normal rate and regular rhythm.     Pulses: Normal pulses.     Heart sounds: Normal heart sounds.  Pulmonary:     Effort: Pulmonary effort is normal.     Breath sounds: Normal breath sounds. No wheezing or rales.  Abdominal:     General: Bowel sounds are normal.     Palpations: Abdomen is soft.     Tenderness: There is no abdominal tenderness. There is no guarding or rebound.  Musculoskeletal:         General: Normal range of motion.     Cervical back: Normal range of motion and neck supple.  Skin:    General: Skin is warm and dry.     Capillary Refill: Capillary refill takes less than 2 seconds.  Neurological:     General: No focal deficit present.     Mental Status: He is alert and oriented to person, place, and time.     Deep Tendon Reflexes: Reflexes normal.  Psychiatric:        Mood and Affect: Mood normal.     (all labs ordered are listed, but only abnormal results are displayed) Results for orders placed or performed during the hospital encounter of 12/13/23  CBG monitoring, ED   Collection Time: 12/13/23 10:29 PM  Result Value Ref Range   Glucose-Capillary 136 (H) 70 - 99 mg/dL  Basic metabolic panel   Collection Time: 12/13/23 11:15 PM  Result Value Ref Range   Sodium 135 135 - 145 mmol/L   Potassium 3.6 3.5 - 5.1 mmol/L   Chloride 97 (L) 98 - 111 mmol/L   CO2 23 22 - 32 mmol/L   Glucose, Bld 155 (H) 70 - 99 mg/dL   BUN 18 8 - 23 mg/dL   Creatinine, Ser 8.40 (H) 0.61 - 1.24 mg/dL   Calcium  9.1 8.9 - 10.3 mg/dL   GFR, Estimated 45 (L) >60 mL/min   Anion gap 14 5 - 15  Pro Brain natriuretic peptide   Collection Time: 12/13/23 11:15 PM  Result Value Ref Range   Pro Brain Natriuretic Peptide 366.0 (H) <300.0 pg/mL  CBC with Differential   Collection Time: 12/13/23 11:15 PM  Result Value Ref Range   WBC 9.7 4.0 - 10.5 K/uL   RBC 4.05 (L) 4.22 - 5.81 MIL/uL   Hemoglobin 13.1 13.0 - 17.0 g/dL   HCT 63.4 (L) 60.9 - 47.9 %   MCV 90.1 80.0 - 100.0 fL   MCH 32.3 26.0 - 34.0 pg   MCHC 35.9 30.0 - 36.0 g/dL   RDW 87.4 88.4 - 84.4 %   Platelets 273 150 - 400 K/uL   nRBC 0.0 0.0 - 0.2 %   Neutrophils Relative % 52 %   Neutro Abs 5.1 1.7 - 7.7 K/uL   Lymphocytes Relative 32 %   Lymphs Abs 3.1 0.7 - 4.0 K/uL   Monocytes Relative 11 %   Monocytes Absolute 1.1 (H) 0.1 - 1.0 K/uL   Eosinophils Relative 4 %   Eosinophils Absolute 0.4 0.0 - 0.5 K/uL   Basophils  Relative 1 %   Basophils Absolute 0.1 0.0 - 0.1 K/uL   Immature Granulocytes 0 %   Abs Immature Granulocytes 0.02 0.00 - 0.07 K/uL  Troponin T, High Sensitivity  Collection Time: 12/13/23 11:15 PM  Result Value Ref Range   Troponin T High Sensitivity 16 <19 ng/L  Urinalysis, Routine w reflex microscopic -Urine, Clean Catch   Collection Time: 12/13/23 11:42 PM  Result Value Ref Range   Color, Urine YELLOW YELLOW   APPearance CLEAR CLEAR   Specific Gravity, Urine 1.010 1.005 - 1.030   pH 6.0 5.0 - 8.0   Glucose, UA NEGATIVE NEGATIVE mg/dL   Hgb urine dipstick TRACE (A) NEGATIVE   Bilirubin Urine NEGATIVE NEGATIVE   Ketones, ur NEGATIVE NEGATIVE mg/dL   Protein, ur NEGATIVE NEGATIVE mg/dL   Nitrite NEGATIVE NEGATIVE   Leukocytes,Ua NEGATIVE NEGATIVE  Urinalysis, Microscopic (reflex)   Collection Time: 12/13/23 11:42 PM  Result Value Ref Range   RBC / HPF 0-5 0 - 5 RBC/hpf   WBC, UA 0-5 0 - 5 WBC/hpf   Bacteria, UA RARE (A) NONE SEEN   Squamous Epithelial / HPF 0-5 0 - 5 /HPF   Mucus PRESENT   Troponin T, High Sensitivity   Collection Time: 12/14/23 12:12 AM  Result Value Ref Range   Troponin T High Sensitivity 16 <19 ng/L   CT Head Wo Contrast Result Date: 12/13/2023 EXAM: CT HEAD WITHOUT CONTRAST 12/13/2023 11:20:30 PM TECHNIQUE: CT of the head was performed without the administration of intravenous contrast. Automated exposure control, iterative reconstruction, and/or weight based adjustment of the mA/kV was utilized to reduce the radiation dose to as low as reasonably achievable. COMPARISON: 01/25/2023 CLINICAL HISTORY: Syncope/presyncope, cerebrovascular cause suspected. Lightheadedness. Hx HTN. FINDINGS: BRAIN AND VENTRICLES: No acute hemorrhage. Gray-white differentiation is preserved. No hydrocephalus. No extra-axial collection. No mass effect or midline shift. Mild subcortical and periventricular small vessel ischemic changes. ORBITS: No acute abnormality. SINUSES: No acute  abnormality. SOFT TISSUES AND SKULL: No acute soft tissue abnormality. No skull fracture. IMPRESSION: 1. No acute intracranial abnormality. 2. Mild small vessel ischemic changes. Electronically signed by: Pinkie Pebbles MD 12/13/2023 11:25 PM EDT RP Workstation: [id number]   DG Chest 2 View Result Date: 12/13/2023 EXAM: 2 VIEW(S) XRAY OF THE CHEST 12/13/2023 11:09:00 PM COMPARISON: 09/29/2022 CLINICAL HISTORY: Chest discomfort. Pt states he has not felt good this week, lightheaded. Just finished Doxycycline  for a sinus infection. At times he has felt like he was going to pass out. HR is irregular from 30s to 74. Pt did have MI in May 2024 with 1 stent. FINDINGS: LUNGS AND PLEURA: No focal pulmonary opacity. No pulmonary edema. No pleural effusion. No pneumothorax. HEART AND MEDIASTINUM: No acute abnormality of the cardiac and mediastinal silhouettes. Aortic atherosclerosis. BONES AND SOFT TISSUES: No acute osseous abnormality. IMPRESSION: 1. No acute process. Electronically signed by: Pinkie Pebbles MD 12/13/2023 11:21 PM EDT RP Workstation: [id number]    EKG: EKG Interpretation Date/Time:  Tuesday December 13 2023 22:35:21 EDT Ventricular Rate:  80 PR Interval:  168 QRS Duration:  119 QT Interval:  393 QTC Calculation: 454 R Axis:   -44  Text Interpretation: Sinus rhythm Ventricular trigeminy Probable left atrial enlargement Nonspecific IVCD with LAD Left ventricular hypertrophy Confirmed by Lenor Hollering [phone]) on 12/13/2023 10:49:29 PM  Radiology: CT Head Wo Contrast Result Date: 12/13/2023 EXAM: CT HEAD WITHOUT CONTRAST 12/13/2023 11:20:30 PM TECHNIQUE: CT of the head was performed without the administration of intravenous contrast. Automated exposure control, iterative reconstruction, and/or weight based adjustment of the mA/kV was utilized to reduce the radiation dose to as low as reasonably achievable. COMPARISON: 01/25/2023 CLINICAL HISTORY: Syncope/presyncope, cerebrovascular cause  suspected. Lightheadedness. Hx HTN. FINDINGS: BRAIN  AND VENTRICLES: No acute hemorrhage. Gray-white differentiation is preserved. No hydrocephalus. No extra-axial collection. No mass effect or midline shift. Mild subcortical and periventricular small vessel ischemic changes. ORBITS: No acute abnormality. SINUSES: No acute abnormality. SOFT TISSUES AND SKULL: No acute soft tissue abnormality. No skull fracture. IMPRESSION: 1. No acute intracranial abnormality. 2. Mild small vessel ischemic changes. Electronically signed by: Pinkie Pebbles MD 12/13/2023 11:25 PM EDT RP Workstation: [id number]   DG Chest 2 View Result Date: 12/13/2023 EXAM: 2 VIEW(S) XRAY OF THE CHEST 12/13/2023 11:09:00 PM COMPARISON: 09/29/2022 CLINICAL HISTORY: Chest discomfort. Pt states he has not felt good this week, lightheaded. Just finished Doxycycline  for a sinus infection. At times he has felt like he was going to pass out. HR is irregular from 30s to 74. Pt did have MI in May 2024 with 1 stent. FINDINGS: LUNGS AND PLEURA: No focal pulmonary opacity. No pulmonary edema. No pleural effusion. No pneumothorax. HEART AND MEDIASTINUM: No acute abnormality of the cardiac and mediastinal silhouettes. Aortic atherosclerosis. BONES AND SOFT TISSUES: No acute osseous abnormality. IMPRESSION: 1. No acute process. Electronically signed by: Pinkie Pebbles MD 12/13/2023 11:21 PM EDT RP Workstation: [id number]     Procedures   Medications Ordered in the ED  sodium chloride  0.9 % bolus 500 mL (500 mLs Intravenous New Bag/Given 12/14/23 0003)                                    Medical Decision Making PVCs and lightheadedness with standing and ambulation   Amount and/or Complexity of Data Reviewed Independent Historian: spouse    Details: See above  External Data Reviewed: notes.    Details: Previous notes reviewed  Labs: ordered.    Details: Sodium normal 135, normal potassium 3.6, creatinine slight elevation 1.59, 2 negative  troponins 16/16.  Urine negative for uti.  White count normal 9.7, normal hemoglobin 13.1, normal platelets  Radiology: ordered and independent interpretation performed.    Details: No acute finding on CT ECG/medicine tests: ordered and independent interpretation performed.  Risk Risk Details: Ruled out for MI.  Occasional PVCs on monitor without significant change in HR.  BP dropped on orthostatics which will make patient feel lightheaded.  Patient is orthostatic as far as blood pressure measurements .  Creatinine also slight up.  Hydrated in the ED.  Very well appearing.  No indication for admission at this time.  I have advised drinking fluids and calling your cardiologist to discuss this issue.  Stable for discharge.  Strict returns.       Final diagnoses:  Orthostasis  Near syncope   No signs of systemic illness or infection. The patient is nontoxic-appearing on exam and vital signs are within normal limits.  I have reviewed the triage vital signs and the nursing notes. Pertinent labs & imaging results that were available during my care of the patient were reviewed by me and considered in my medical decision making (see chart for details). After history, exam, and medical workup I feel the patient has been appropriately medically screened and is safe for discharge home. Pertinent diagnoses were discussed with the patient. Patient was given return precautions.      ED Discharge Orders     None          Skylen Spiering, MD 12/14/23 [id number]

## 2023-12-15 ENCOUNTER — Encounter (HOSPITAL_BASED_OUTPATIENT_CLINIC_OR_DEPARTMENT_OTHER): Payer: Self-pay

## 2023-12-15 NOTE — Telephone Encounter (Signed)
 Rn reviewed patient chart . Clearance has bee sent to patient's dentist office by the pre-op team .  Patient is aware it was sent on 12/13/23.

## 2023-12-15 NOTE — Telephone Encounter (Signed)
 Spoke to patient . Patient states she left a dental form for provider to sign and send back tp dental office.   Patient states he will be seeing APP at [facility] office tomorrow - Rn will locate forma and fax over for APP to sign and give to patient. Since Dr Anner will not be back in the office until 12/21/23   Patient states he had his repatha  refilled already.   Patient is aware to mention to APP tomorrow about dental clearance form

## 2023-12-16 ENCOUNTER — Encounter (HOSPITAL_BASED_OUTPATIENT_CLINIC_OR_DEPARTMENT_OTHER): Payer: Self-pay | Admitting: Family

## 2023-12-16 ENCOUNTER — Ambulatory Visit (HOSPITAL_BASED_OUTPATIENT_CLINIC_OR_DEPARTMENT_OTHER): Admitting: Family

## 2023-12-16 VITALS — BP 140/88 | HR 69 | Ht 70.0 in | Wt 215.2 lb

## 2023-12-16 DIAGNOSIS — E785 Hyperlipidemia, unspecified: Secondary | ICD-10-CM

## 2023-12-16 DIAGNOSIS — I25118 Atherosclerotic heart disease of native coronary artery with other forms of angina pectoris: Secondary | ICD-10-CM | POA: Diagnosis not present

## 2023-12-16 DIAGNOSIS — I951 Orthostatic hypotension: Secondary | ICD-10-CM

## 2023-12-16 DIAGNOSIS — R002 Palpitations: Secondary | ICD-10-CM

## 2023-12-16 NOTE — Patient Instructions (Signed)
 Medication Instructions:   Your physician recommends that you continue on your current medications as directed. Please refer to the Current Medication list given to you today.   *If you need a refill on your cardiac medications before your next appointment, please call your pharmacy*  Lab Work: Your physician recommends that you return for lab work in 2 weeks. Pro Bnp/BMET  If you have labs (blood work) drawn today and your tests are completely normal, you will receive your results only by: MyChart Message (if you have MyChart) OR A paper copy in the mail If you have any lab test that is abnormal or we need to change your treatment, we will call you to review the results.  Testing/Procedures: GEOFFRY HEWS- Long Term Monitor Instructions  Your physician has requested you wear a ZIO patch monitor for 3  days.  This is a single patch monitor. [employer] supplies one patch monitor per enrollment. Additional stickers are not available. Please do not apply patch if you will be having a Nuclear Stress Test,  Echocardiogram, Cardiac CT, MRI, or Chest Xray during the period you would be wearing the  monitor. The patch cannot be worn during these tests. You cannot remove and re-apply the  ZIO XT patch monitor.  Your ZIO patch monitor will be mailed 3 day USPS to your address on file. It may take 3-5 days  to receive your monitor after you have been enrolled.  Once you have received your monitor, please review the enclosed instructions. Your monitor  has already been registered assigning a specific monitor serial # to you.  Billing and Patient Assistance Program Information  We have supplied [employer] with any of your insurance information on file for billing purposes. [employer] offers a sliding scale Patient Assistance Program for patients that do not have  insurance, or whose insurance does not completely cover the cost of the ZIO monitor.  You must apply for the Patient Assistance Program to qualify for  this discounted rate.  To apply, please call [employer] at [phone], select option 4, select option 2, ask to apply for  Patient Assistance Program. Meredeth will ask your household income, and how many people  are in your household. They will quote your out-of-pocket cost based on that information.  [employer] will also be able to set up a 84-month, interest-free payment plan if needed.  Applying the monitor   Shave hair from upper left chest.  Hold abrader disc by orange tab. Rub abrader in 40 strokes over the upper left chest as  indicated in your monitor instructions.  Clean area with 4 enclosed alcohol  pads. Let dry.  Apply patch as indicated in monitor instructions. Patch will be placed under collarbone on left  side of chest with arrow pointing upward.  Rub patch adhesive wings for 2 minutes. Remove white label marked 1. Remove the white  label marked 2. Rub patch adhesive wings for 2 additional minutes.  While looking in a mirror, press and release button in center of patch. A small green light will  flash 3-4 times. This will be your only indicator that the monitor has been turned on.  Do not shower for the first 24 hours. You may shower after the first 24 hours.  Press the button if you feel a symptom. You will hear a small click. Record Date, Time and  Symptom in the Patient Logbook.  When you are ready to remove the patch, follow instructions on the last 2 pages of Patient  Logbook.  Stick patch monitor onto the last page of Patient Logbook.  Place Patient Logbook in the blue and white box. Use locking tab on box and tape box closed  securely. The blue and white box has prepaid postage on it. Please place it in the mailbox as  soon as possible. Your physician should have your test results approximately 7 days after the  monitor has been mailed back to [facility].  Call [facility] Customer Care at [phone] if you have questions regarding  your ZIO XT patch monitor.  Call them immediately if you see an orange light blinking on your  monitor.  If your monitor falls off in less than 4 days, contact our Monitor department at [phone].  If your monitor becomes loose or falls off after 4 days call [employer] at [phone] for  suggestions on securing your monitor     Follow-Up: At [facility], you and your health needs are our priority.  As part of our continuing mission to provide you with exceptional heart care, our providers are all part of one team.  This team includes your primary Cardiologist (physician) and Advanced Practice Providers or APPs (Physician Assistants and Nurse Practitioners) who all work together to provide you with the care you need, when you need it.  Your next appointment:   3 month(s)  Provider:   Alm Clay, MD    We recommend signing up for the patient portal called MyChart.  Sign up information is provided on this After Visit Summary.  MyChart is used to connect with patients for Virtual Visits (Telemedicine).  Patients are able to view lab/test results, encounter notes, upcoming appointments, etc.  Non-urgent messages can be sent to your provider as well.   To learn more about what you can do with MyChart, go to [email or web address].   Other Instructions    1st Floor: - Lobby - Registration  - Pharmacy  - Lab - Cafe  2nd Floor: - PV Lab - Diagnostic Testing (echo, CT, nuclear med)  3rd Floor: - Vacant  4th Floor: - TCTS (cardiothoracic surgery) - AFib Clinic - Structural Heart Clinic - Vascular Surgery  - Vascular Ultrasound  5th Floor: - HeartCare Cardiology (general and EP) - Clinical Pharmacy for coumadin, hypertension, lipid, weight-loss medications, and med management appointments    Valet parking services will be available as well.        To prevent lightheadedness: Stay well hydrated (aiming for a minimum of 64 oz fluid per day, ideally closer to 80 oz) Continue to eat  regular meals Consider knee high compression stockings during the daytime rate 15-40mmHg  Make position changes slowly Limit caffeine

## 2023-12-16 NOTE — Progress Notes (Signed)
 Cardiology Office Note   Date:  12/21/2023  ID:  Aries, Bryan Thompson 06, 1952, MRN [id number] PCP: Avva, Ravisankar, MD  [facility] Providers Cardiologist:  Alm Clay, MD     History of Present Illness  Bryan Thompson is a 73 y.o. male withhistory of hypertension, hyperlipidemia with prior statin intolerance, MGUS followed at the cancer center, hypothyroidism, depression, CAD 09/29/22 LHC PCI of LCx with otherwise mild concomitant CAD.  ER visit 7/22 with orthostasis. Monitor with occasional PVC.Bp dropped with orthostatic vitals. He was given hydration.   Presents today for follow up wit his wife. Notes since ED visit with will episodes of lightheadedness and palpitations. Notes this lightheadedness pre-dates his MI and most often when up moving or change positions quickly. No syncope but has come close.  These episodes have increased in frequency over the past week and a half, coinciding with hot weather. He feels close to syncope but has not lost consciousness. He attempts to stay hydrated, although he is not a big water drinker. Presently takes Metoprolol  12.5mg  taken two days on and one day off.   ROS: Please see the history of present illness.    All other systems reviewed and are negative.   Studies Reviewed      Cardiac Studies & Procedures   ______________________________________________________________________________________________ CARDIAC CATHETERIZATION  CARDIAC CATHETERIZATION 09/29/2022  Conclusion   Ramus lesion is 40% stenosed.   Prox RCA lesion is 30% stenosed.   Mid RCA lesion is 20% stenosed.   Mid Cx to Dist Cx lesion is 99% stenosed.   Prox LAD to Mid LAD lesion is 30% stenosed.   A drug-eluting stent was successfully placed.   Post intervention, there is a 0% residual stenosis.  Acute coronary syndrome secondary to total 99% left circumflex stenosis with significant thrombus burden.  Mild concomitant CAD with 30% AT stenosis, 40%  proximal ramus intermediate stenosis, and 30 and 20% mid RCA stenosis and a dominant RCA.  LV EDP 28 mm Hg.  Successful PCI to the left circumflex vessel with Pronto thrombectomy and ultimate insertion of a 2.5 x 20 mm Synergy DES stent postdilated to 2.71 mm with transient no flow and ultimate restoration of brisk TIMI-3 flow with the 99% stenosis being reduced to 0%.  RECOMMENDATION: DAPT with aspirin /Brilinta  for minimum of 12 months.  Continue Aggrastat  for 18 hours post procedure.  Aggressive lipid-lowering therapy with trial of statin rechallenge.  If unable to take statin, initiate PCSK9 inhibition.  Medical therapy for concomitant CAD.  Findings Coronary Findings Diagnostic  Dominance: Right  Left Anterior Descending Prox LAD to Mid LAD lesion is 30% stenosed.  Ramus Intermedius Ramus lesion is 40% stenosed.  Left Circumflex Vessel is small. Mid Cx to Dist Cx lesion is 99% stenosed.  First Obtuse Marginal Branch Vessel is small in size.  Fourth Obtuse Marginal Branch Vessel is small in size.  Right Coronary Artery Prox RCA lesion is 30% stenosed. Mid RCA lesion is 20% stenosed.  Intervention  Mid Cx to Dist Cx lesion Stent Pre-stent angioplasty was performed. A drug-eluting stent was successfully placed. Post-Intervention Lesion Assessment The intervention was successful. Pre-interventional TIMI flow is 2. Post-intervention TIMI flow is 3. There is a 0% residual stenosis post intervention.     ECHOCARDIOGRAM  ECHOCARDIOGRAM COMPLETE 09/29/2022  Narrative ECHOCARDIOGRAM REPORT    Patient Name:   Bryan Thompson [facility] Date of Exam: 09/29/2022 Medical Rec #:  [id number]       Height:  70.0 in Accession #:    [id number]      Weight:       215.0 lb Date of Birth:  03-11-1951      BSA:          2.152 m Patient Age:    71 years        BP:           109/58 mmHg Patient Gender: M               HR:           75 bpm. Exam Location:  Inpatient  Procedure: 2D Echo,  Cardiac Doppler, Color Doppler and Intracardiac Opacification Agent  STAT ECHO  Indications:    Elevated Troponin  History:        Patient has no prior history of Echocardiogram examinations. Risk Factors:Hypertension and Dyslipidemia.  Sonographer:    Geofm Doss Referring Phys: [id number] Bryan Thompson   Sonographer Comments: Msgd Vergil MD @ 12:33pm IMPRESSIONS   1. Left ventricular ejection fraction, by estimation, is 50 to 55%. The left ventricle has low normal function. The left ventricle demonstrates regional wall motion abnormalities (see scoring diagram/findings for description). Left ventricular diastolic parameters are consistent with Grade I diastolic dysfunction (impaired relaxation). There is severe hypokinesis of the left ventricular, entire inferolateral wall. 2. Right ventricular systolic function is normal. The right ventricular size is normal. 3. Left atrial size was mildly dilated. 4. The mitral valve is normal in structure. No evidence of mitral valve regurgitation. No evidence of mitral stenosis. 5. The aortic valve is tricuspid. There is mild calcification of the aortic valve. Aortic valve regurgitation is not visualized. No aortic stenosis is present. Aortic valve area, by VTI measures 1.80 cm. Aortic valve mean gradient measures 6.3 mmHg. Aortic valve Vmax measures 1.69 m/s. 6. The inferior vena cava is normal in size with greater than 50% respiratory variability, suggesting right atrial pressure of 3 mmHg.  FINDINGS Left Ventricle: Left ventricular ejection fraction, by estimation, is 50 to 55%. The left ventricle has low normal function. The left ventricle demonstrates regional wall motion abnormalities. Severe hypokinesis of the left ventricular, entire inferolateral wall. Definity  contrast agent was given IV to delineate the left ventricular endocardial borders. The left ventricular internal cavity size was normal in size. There is no left ventricular  hypertrophy. Left ventricular diastolic parameters are consistent with Grade I diastolic dysfunction (impaired relaxation).  Right Ventricle: The right ventricular size is normal. No increase in right ventricular wall thickness. Right ventricular systolic function is normal.  Left Atrium: Left atrial size was mildly dilated.  Right Atrium: Right atrial size was normal in size.  Pericardium: There is no evidence of pericardial effusion.  Mitral Valve: The mitral valve is normal in structure. No evidence of mitral valve regurgitation. No evidence of mitral valve stenosis. MV peak gradient, 5.8 mmHg. The mean mitral valve gradient is 2.0 mmHg.  Tricuspid Valve: The tricuspid valve is normal in structure. Tricuspid valve regurgitation is trivial. No evidence of tricuspid stenosis.  Aortic Valve: The aortic valve is tricuspid. There is mild calcification of the aortic valve. Aortic valve regurgitation is not visualized. No aortic stenosis is present. Aortic valve mean gradient measures 6.3 mmHg. Aortic valve peak gradient measures 11.5 mmHg. Aortic valve area, by VTI measures 1.80 cm.  Pulmonic Valve: The pulmonic valve was normal in structure. Pulmonic valve regurgitation is trivial. No evidence of pulmonic stenosis.  Aorta: The aortic root is normal in size and  structure.  Venous: The inferior vena cava is normal in size with greater than 50% respiratory variability, suggesting right atrial pressure of 3 mmHg.  IAS/Shunts: No atrial level shunt detected by color flow Doppler.   LEFT VENTRICLE PLAX 2D LVIDd:         4.30 cm      Diastology LVIDs:         2.90 cm      LV e' medial:    6.03 cm/s LV PW:         1.00 cm      LV E/e' medial:  18.9 LV IVS:        0.70 cm      LV e' lateral:   4.64 cm/s LVOT diam:     1.80 cm      LV E/e' lateral: 24.6 LV SV:         62 LV SV Index:   29 LVOT Area:     2.54 cm  LV Volumes (MOD) LV vol d, MOD A2C: 53.4 ml LV vol d, MOD A4C: 110.0 ml LV  vol s, MOD A2C: 23.8 ml LV vol s, MOD A4C: 37.8 ml LV SV MOD A2C:     29.6 ml LV SV MOD A4C:     110.0 ml LV SV MOD BP:      48.8 ml  RIGHT VENTRICLE             IVC RV Basal diam:  3.00 cm     IVC diam: 2.00 cm RV S prime:     13.50 cm/s TAPSE (M-mode): 1.9 cm  LEFT ATRIUM             Index        RIGHT ATRIUM           Index LA diam:        3.50 cm 1.63 cm/m   RA Area:     11.40 cm LA Vol (A2C):   40.8 ml 18.95 ml/m  RA Volume:   25.00 ml  11.61 ml/m LA Vol (A4C):   42.6 ml 19.79 ml/m LA Biplane Vol: 42.5 ml 19.74 ml/m AORTIC VALVE AV Area (Vmax):    1.69 cm AV Area (Vmean):   1.78 cm AV Area (VTI):     1.80 cm AV Vmax:           169.33 cm/s AV Vmean:          118.667 cm/s AV VTI:            0.343 m AV Peak Grad:      11.5 mmHg AV Mean Grad:      6.3 mmHg LVOT Vmax:         112.50 cm/s LVOT Vmean:        83.100 cm/s LVOT VTI:          0.242 m LVOT/AV VTI ratio: 0.71  AORTA Ao Root diam: 3.10 cm Ao Asc diam:  3.50 cm  MITRAL VALVE                TRICUSPID VALVE MV Area (PHT): 4.04 cm     TR Peak grad:   33.6 mmHg MV Area VTI:   1.85 cm     TR Vmax:        290.00 cm/s MV Peak grad:  5.8 mmHg MV Mean grad:  2.0 mmHg     SHUNTS MV Vmax:       1.20 m/s  Systemic VTI:  0.24 m MV Vmean:      72.2 cm/s    Systemic Diam: 1.80 cm MV Decel Time: 188 msec MV E velocity: 114.00 cm/s MV A velocity: 122.00 cm/s MV E/A ratio:  0.93  Toribio Fuel MD Electronically signed by Toribio Fuel MD Signature Date/Time: 09/29/2022/12:49:50 PM    Final          ______________________________________________________________________________________________      Risk Assessment/Calculations          Physical Exam VS:  BP (!) 140/88   Pulse 69   Ht 5' 10 (1.778 m)   Wt 215 lb 3.2 oz (97.6 kg)   SpO2 95%   BMI 30.88 kg/m        Wt Readings from Last 3 Encounters:  12/16/23 215 lb 3.2 oz (97.6 kg)  12/13/23 215 lb (97.5 kg)  10/10/23 217 lb 12.8 oz  (98.8 kg)    GEN: Well nourished, well developed in no acute distress NECK: No JVD; No carotid bruits CARDIAC: RRR, no murmurs, rubs, gallops RESPIRATORY:  Clear to auscultation without rales, wheezing or rhonchi  ABDOMEN: Soft, non-tender, non-distended EXTREMITIES:  No edema; No deformity   ASSESSMENT AND PLAN  Palpitations / orthostatic hypotension - Recent ED visit. Encouraged oral hydration. Orthostatic hypotension precautions reviewed. Plan for 3 day ZIO (he politely declines  longer monitor as he reports he sweats a lot and does not feel he would be able to wear for longer than that). Labs in 2 weeks BMET, ProBNP.   Hypothyroidism - managed by PCP. Has had recent adjustment of thyroid  medication.  CAD / HLD, LDL goal <70 - Stable with no anginal symptoms. No indication for ischemic evaluation.  10/2023 LDL 45. Has missed 2 doses of Repatha  due to URI, encouraged to resume. Stable with no anginal symptoms. No indication for ischemic evaluation. Recommend aiming for 150 minutes of moderate intensity activity per week and following a heart healthy diet.   Notes runny nose, consider transition to Praluent if it persists.        Dispo: follow up in 3 months with Dr. Anner  Signed, Reche GORMAN Finder, NP

## 2023-12-19 ENCOUNTER — Other Ambulatory Visit (HOSPITAL_BASED_OUTPATIENT_CLINIC_OR_DEPARTMENT_OTHER): Payer: Self-pay | Admitting: Family

## 2023-12-19 ENCOUNTER — Ambulatory Visit: Attending: Family

## 2023-12-19 DIAGNOSIS — R002 Palpitations: Secondary | ICD-10-CM

## 2023-12-19 DIAGNOSIS — E785 Hyperlipidemia, unspecified: Secondary | ICD-10-CM

## 2023-12-19 DIAGNOSIS — I951 Orthostatic hypotension: Secondary | ICD-10-CM

## 2023-12-19 DIAGNOSIS — I25118 Atherosclerotic heart disease of native coronary artery with other forms of angina pectoris: Secondary | ICD-10-CM

## 2023-12-19 NOTE — Progress Notes (Unsigned)
 Enrolled for [employer] to mail a ZIO XT long term holter monitor to the patients address on file.   Dr. Herbie Baltimore to read.

## 2023-12-21 ENCOUNTER — Encounter (HOSPITAL_BASED_OUTPATIENT_CLINIC_OR_DEPARTMENT_OTHER): Payer: Self-pay | Admitting: Family

## 2023-12-31 LAB — BASIC METABOLIC PANEL WITH GFR
BUN/Creatinine Ratio: 13 (ref 10–24)
BUN: 19 mg/dL (ref 8–27)
CO2: 24 mmol/L (ref 20–29)
Calcium: 9.9 mg/dL (ref 8.6–10.2)
Chloride: 99 mmol/L (ref 96–106)
Creatinine, Ser: 1.47 mg/dL — ABNORMAL HIGH (ref 0.76–1.27)
Glucose: 115 mg/dL — ABNORMAL HIGH (ref 70–99)
Potassium: 4.8 mmol/L (ref 3.5–5.2)
Sodium: 138 mmol/L (ref 134–144)
eGFR: 50 mL/min/1.73 — ABNORMAL LOW (ref >59)

## 2023-12-31 LAB — PRO B NATRIURETIC PEPTIDE: NT-Pro BNP: 224 pg/mL (ref 0–376)

## 2024-01-01 ENCOUNTER — Ambulatory Visit (HOSPITAL_BASED_OUTPATIENT_CLINIC_OR_DEPARTMENT_OTHER): Payer: Self-pay | Admitting: Family

## 2024-01-03 DIAGNOSIS — R002 Palpitations: Secondary | ICD-10-CM | POA: Diagnosis not present

## 2024-01-10 DIAGNOSIS — E039 Hypothyroidism, unspecified: Secondary | ICD-10-CM | POA: Diagnosis not present

## 2024-01-16 ENCOUNTER — Encounter (HOSPITAL_BASED_OUTPATIENT_CLINIC_OR_DEPARTMENT_OTHER): Payer: Self-pay

## 2024-01-16 NOTE — Telephone Encounter (Signed)
 Triage - please update patient:  ZIO monitor has to be formally reviewed by MD prior to results being released to MyChart. By review of preliminary result, monitor showed predominantly normal sinus rhythm with average heart rate of 64 bpm. Early beats called PVC or PAC occurred rarely with an overall occurrence rate of 1.6-2.2%. These are uncommon and not of concern.Triggered episode was associated with one of these early beats. If experiencing significant palpitations, would increase Metoprolol  to once per day (presently skips a day). If not bothered by palpitations, continue current medication regimen. Would again advise increased fluid intake (at least 64 oz), eating regular meals, making position changes slowly.   Tessah Patchen S Troyce Gieske, NP

## 2024-01-17 ENCOUNTER — Encounter: Payer: Self-pay | Admitting: *Deleted

## 2024-01-17 NOTE — Telephone Encounter (Signed)
 Spoke with patient regarding monitor He may increase Metoprolol  but not sure  He is going to send us  an update in couple weeks to let us  know if increased and how he is doing

## 2024-01-21 ENCOUNTER — Other Ambulatory Visit: Payer: Self-pay | Admitting: Interventional Cardiology

## 2024-01-24 ENCOUNTER — Other Ambulatory Visit (HOSPITAL_BASED_OUTPATIENT_CLINIC_OR_DEPARTMENT_OTHER): Payer: Self-pay

## 2024-01-24 ENCOUNTER — Other Ambulatory Visit: Payer: Self-pay

## 2024-01-24 MED ORDER — PRASUGREL HCL 10 MG PO TABS
10.0000 mg | ORAL_TABLET | Freq: Every day | ORAL | 3 refills | Status: AC
  Filled 2024-01-24: qty 90, 90d supply, fill #0

## 2024-03-08 IMAGING — US US RENAL
2 series · 14 of 25 positions shown · non-contrast
Comparison: None Available.

CLINICAL DATA: Chronic kidney disease, stage 3a (HCC)

EXAM:
RENAL / URINARY TRACT ULTRASOUND COMPLETE

[Series 1: us renal · 0.28mm/px · 13 of 44 slices shown (1 of 2)]
[im 1/44]
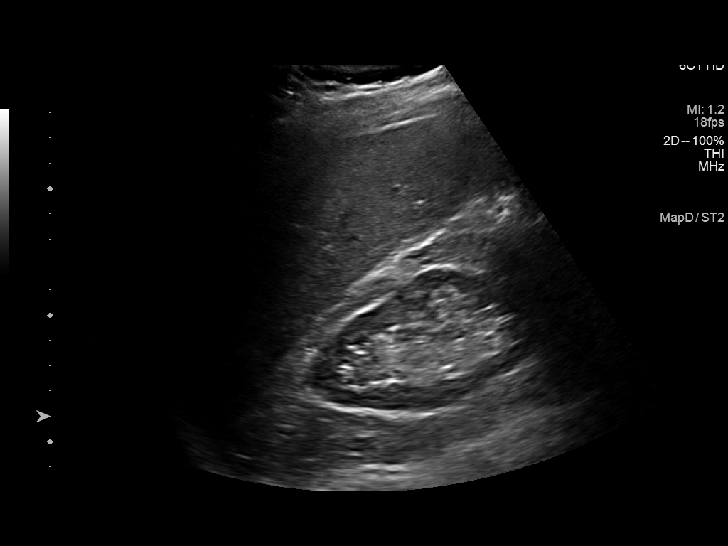
[im 4/44]
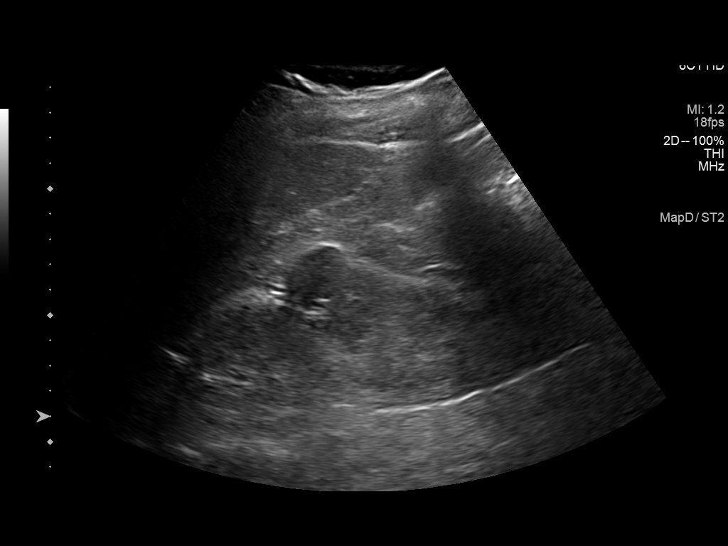
[im 8/44]
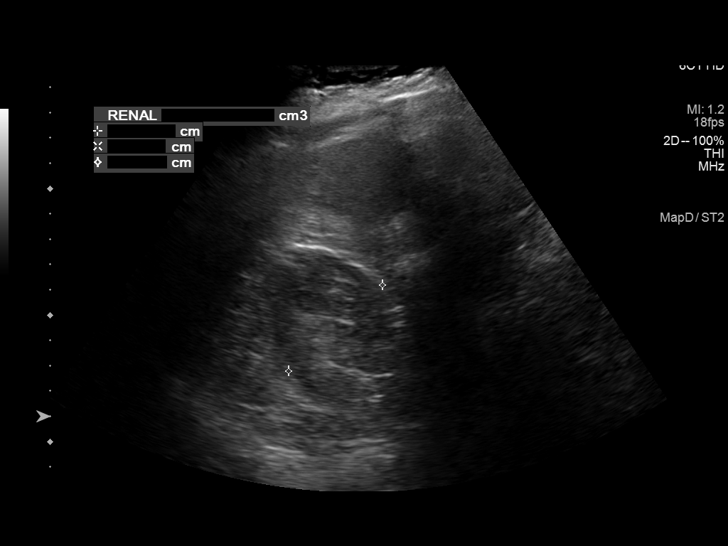
[im 12/44]
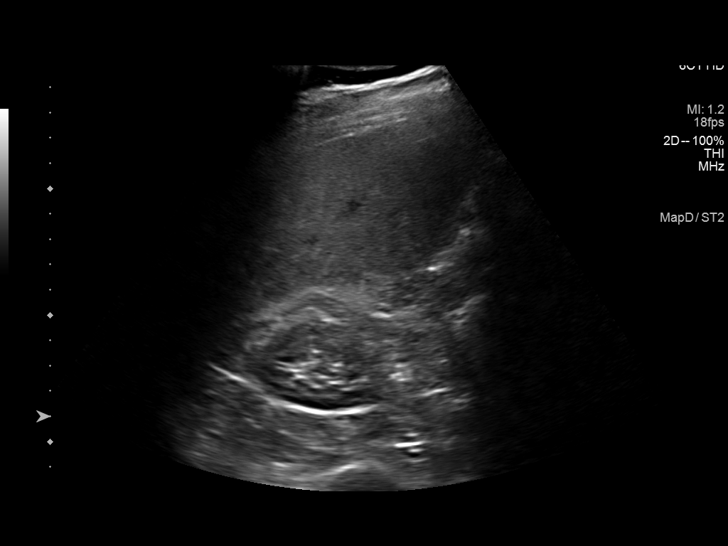
[im 15/44]
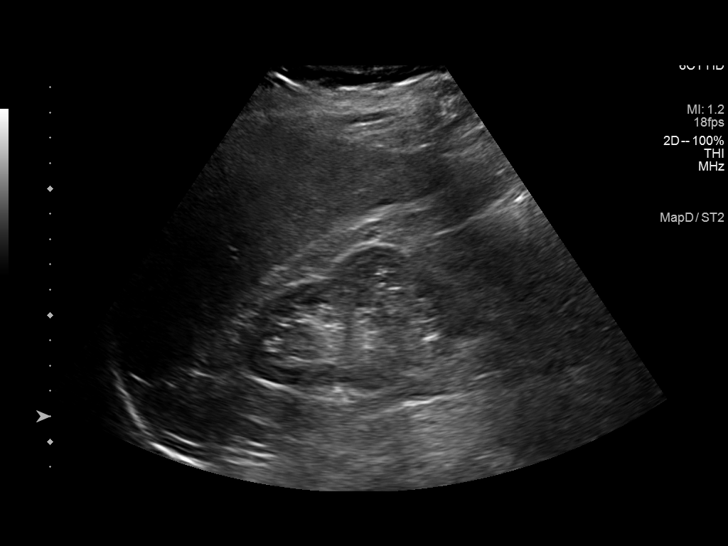
[im 17/44]
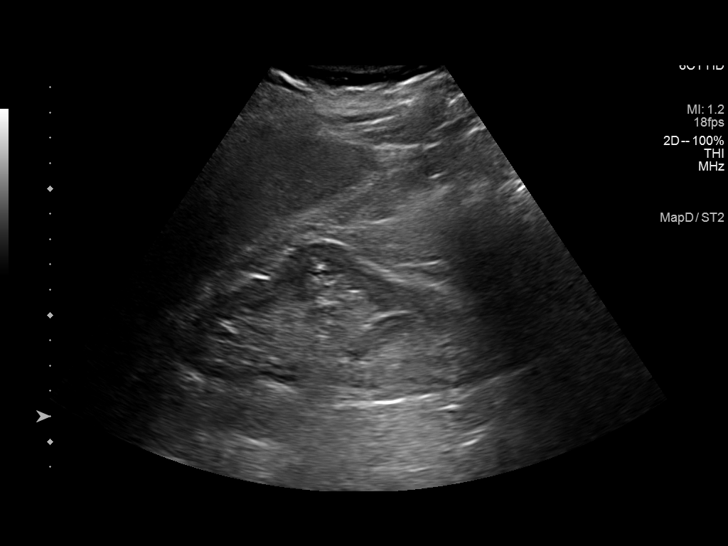
[im 21/44]
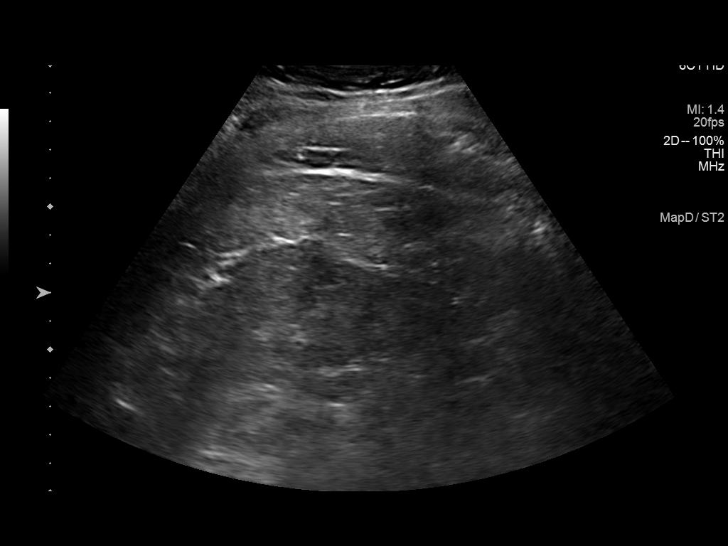
[im 25/44]
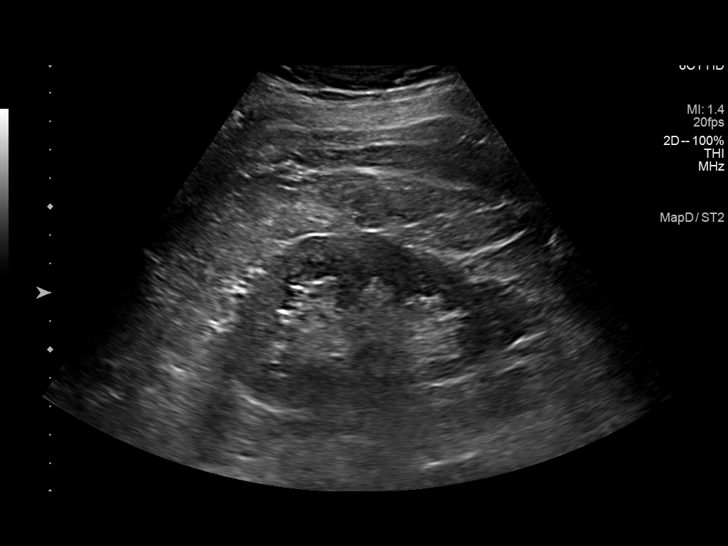
[im 29/44]
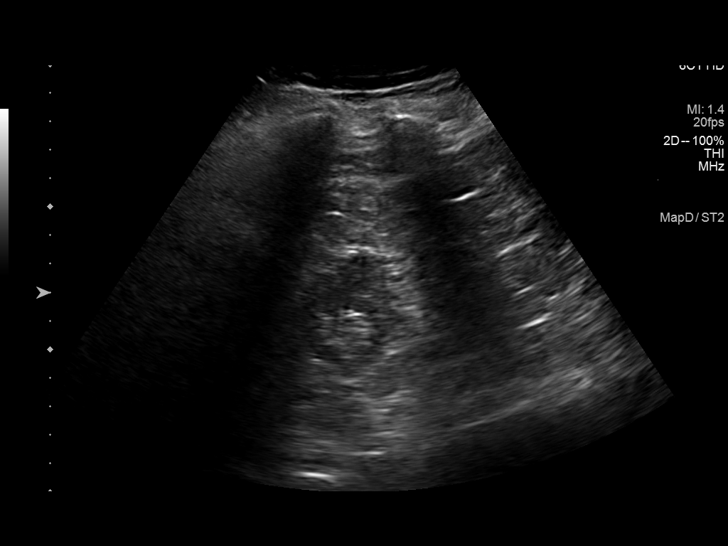
[im 30/44]
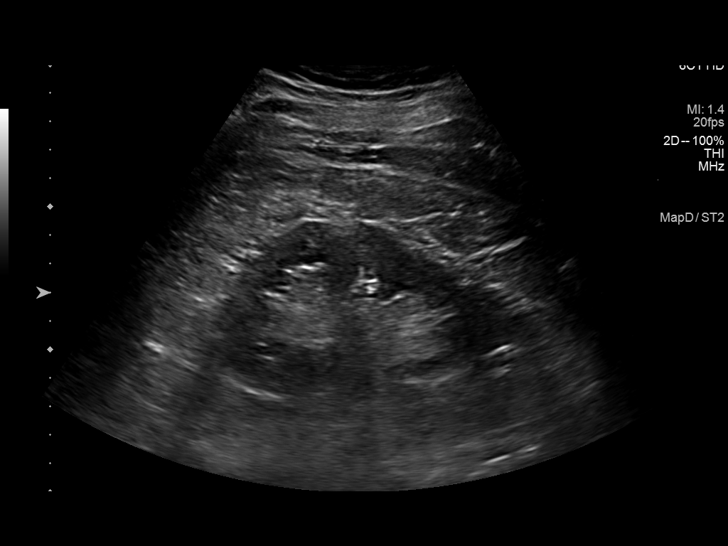
[im 34/44]
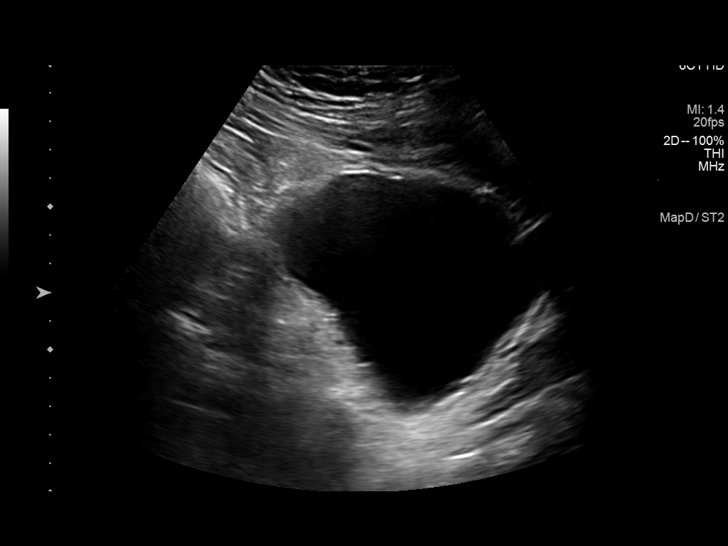
[im 38/44]
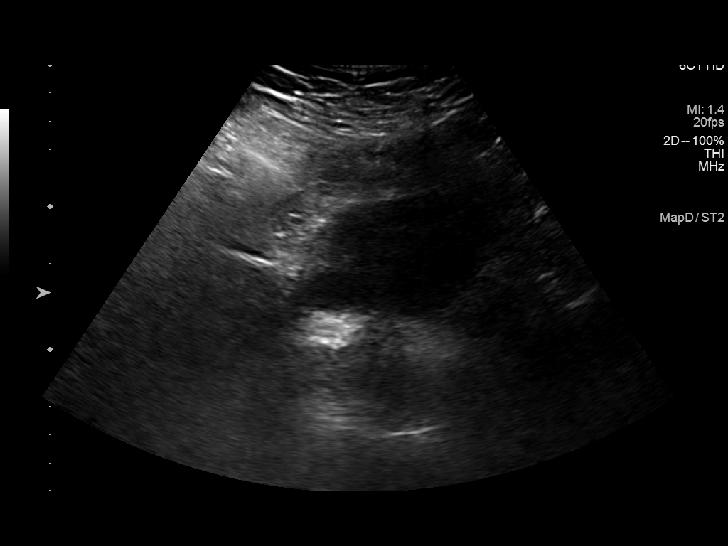
[im 42/44]
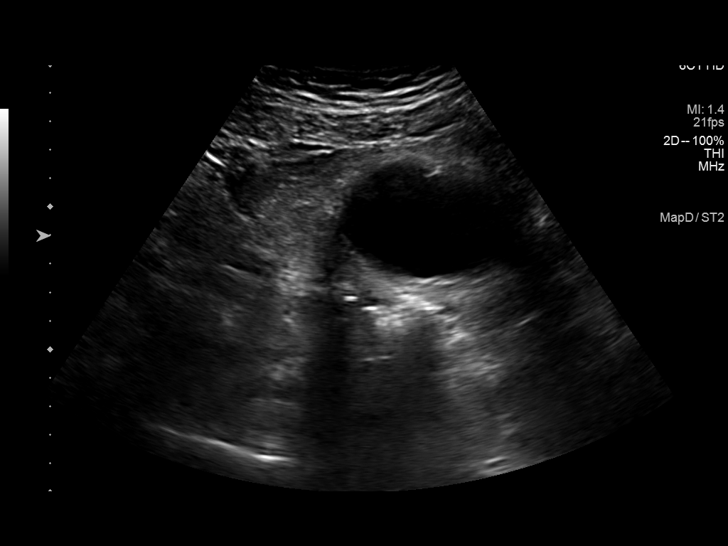

[Series 2001: us renal · 0.25mm/px · 1 of 2 slices shown (2 of 2)]
[im 1/2]
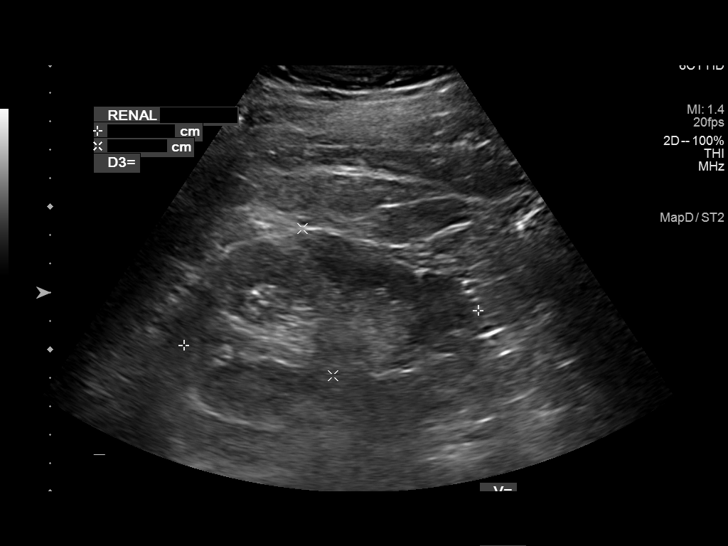

[14 of 25 positions shown; findings below may reference images not displayed]

FINDINGS: Right Kidney:

Renal measurements: 11.0 x 5.5 x 5.0 cm = volume: 159 mL. Slight
renal cortical thinning with mildly increased echogenicity. No
hydronephrosis

Left Kidney:

Renal measurements: 10.4 x 5.3 x 5.0 cm = volume: 143.1 mL. No
hydronephrosis. Mildly increased renal cortical echogenicity.

Bladder:

Appears normal for degree of bladder distention.

Other:

None.
IMPRESSION: No hydronephrosis. Mildly increased cortical echogenicity
bilaterally with slight renal cortical thinning on the right,
compatible with chronic medical renal disease.

## 2024-03-12 DIAGNOSIS — E039 Hypothyroidism, unspecified: Secondary | ICD-10-CM | POA: Diagnosis not present

## 2024-03-12 DIAGNOSIS — Z1212 Encounter for screening for malignant neoplasm of rectum: Secondary | ICD-10-CM | POA: Diagnosis not present

## 2024-03-12 DIAGNOSIS — E7849 Other hyperlipidemia: Secondary | ICD-10-CM | POA: Diagnosis not present

## 2024-03-12 DIAGNOSIS — Z125 Encounter for screening for malignant neoplasm of prostate: Secondary | ICD-10-CM | POA: Diagnosis not present

## 2024-03-19 DIAGNOSIS — D472 Monoclonal gammopathy: Secondary | ICD-10-CM | POA: Diagnosis not present

## 2024-03-19 DIAGNOSIS — E039 Hypothyroidism, unspecified: Secondary | ICD-10-CM | POA: Diagnosis not present

## 2024-03-19 DIAGNOSIS — Z0000 Encounter for general adult medical examination without abnormal findings: Secondary | ICD-10-CM | POA: Diagnosis not present

## 2024-03-19 DIAGNOSIS — R7301 Impaired fasting glucose: Secondary | ICD-10-CM | POA: Diagnosis not present

## 2024-03-19 DIAGNOSIS — Z1331 Encounter for screening for depression: Secondary | ICD-10-CM | POA: Diagnosis not present

## 2024-03-19 DIAGNOSIS — Z1339 Encounter for screening examination for other mental health and behavioral disorders: Secondary | ICD-10-CM | POA: Diagnosis not present

## 2024-03-19 DIAGNOSIS — K5900 Constipation, unspecified: Secondary | ICD-10-CM | POA: Diagnosis not present

## 2024-03-19 DIAGNOSIS — R82998 Other abnormal findings in urine: Secondary | ICD-10-CM | POA: Diagnosis not present

## 2024-03-19 DIAGNOSIS — D126 Benign neoplasm of colon, unspecified: Secondary | ICD-10-CM | POA: Diagnosis not present

## 2024-03-19 DIAGNOSIS — F331 Major depressive disorder, recurrent, moderate: Secondary | ICD-10-CM | POA: Diagnosis not present

## 2024-03-19 DIAGNOSIS — I129 Hypertensive chronic kidney disease with stage 1 through stage 4 chronic kidney disease, or unspecified chronic kidney disease: Secondary | ICD-10-CM | POA: Diagnosis not present

## 2024-03-19 DIAGNOSIS — I25119 Atherosclerotic heart disease of native coronary artery with unspecified angina pectoris: Secondary | ICD-10-CM | POA: Diagnosis not present

## 2024-03-19 DIAGNOSIS — M1990 Unspecified osteoarthritis, unspecified site: Secondary | ICD-10-CM | POA: Diagnosis not present

## 2024-03-19 DIAGNOSIS — I951 Orthostatic hypotension: Secondary | ICD-10-CM | POA: Diagnosis not present

## 2024-03-21 ENCOUNTER — Ambulatory Visit: Admitting: Cardiology

## 2024-03-30 ENCOUNTER — Encounter: Payer: Self-pay | Admitting: Cardiology

## 2024-03-30 ENCOUNTER — Ambulatory Visit: Attending: Cardiology | Admitting: Cardiology

## 2024-03-30 VITALS — BP 145/78 | Ht 70.0 in | Wt 220.2 lb

## 2024-03-30 DIAGNOSIS — I471 Supraventricular tachycardia, unspecified: Secondary | ICD-10-CM

## 2024-03-30 DIAGNOSIS — F32A Depression, unspecified: Secondary | ICD-10-CM | POA: Diagnosis not present

## 2024-03-30 DIAGNOSIS — D472 Monoclonal gammopathy: Secondary | ICD-10-CM | POA: Diagnosis not present

## 2024-03-30 DIAGNOSIS — I10 Essential (primary) hypertension: Secondary | ICD-10-CM | POA: Diagnosis not present

## 2024-03-30 DIAGNOSIS — G6189 Other inflammatory polyneuropathies: Secondary | ICD-10-CM

## 2024-03-30 DIAGNOSIS — Z9861 Coronary angioplasty status: Secondary | ICD-10-CM

## 2024-03-30 DIAGNOSIS — R002 Palpitations: Secondary | ICD-10-CM | POA: Diagnosis not present

## 2024-03-30 DIAGNOSIS — T466X5D Adverse effect of antihyperlipidemic and antiarteriosclerotic drugs, subsequent encounter: Secondary | ICD-10-CM | POA: Diagnosis not present

## 2024-03-30 DIAGNOSIS — I2510 Atherosclerotic heart disease of native coronary artery without angina pectoris: Secondary | ICD-10-CM | POA: Diagnosis not present

## 2024-03-30 DIAGNOSIS — R5383 Other fatigue: Secondary | ICD-10-CM

## 2024-03-30 DIAGNOSIS — I493 Ventricular premature depolarization: Secondary | ICD-10-CM

## 2024-03-30 DIAGNOSIS — E785 Hyperlipidemia, unspecified: Secondary | ICD-10-CM | POA: Diagnosis not present

## 2024-03-30 DIAGNOSIS — M791 Myalgia, unspecified site: Secondary | ICD-10-CM | POA: Diagnosis not present

## 2024-03-30 DIAGNOSIS — T466X5A Adverse effect of antihyperlipidemic and antiarteriosclerotic drugs, initial encounter: Secondary | ICD-10-CM

## 2024-03-30 DIAGNOSIS — I249 Acute ischemic heart disease, unspecified: Secondary | ICD-10-CM

## 2024-03-30 MED ORDER — NITROGLYCERIN 0.4 MG SL SUBL: 0.4000 mg | SUBLINGUAL_TABLET | SUBLINGUAL | 3 refills | Status: AC | PRN

## 2024-03-30 MED ORDER — CLOPIDOGREL BISULFATE 75 MG PO TABS: 75.0000 mg | ORAL_TABLET | Freq: Every day | ORAL | 3 refills | Status: AC

## 2024-03-30 NOTE — Progress Notes (Signed)
 - Cardiology Office Note:  .   Date:  03/31/2024  ID:  Bryan Thompson, DOB 1950-06-29, MRN [id number] PCP: Janey Santos, MD  [facility] Providers Cardiologist:  Alm Clay, MD     Chief Complaint  Patient presents with   Follow-up    79-month follow-up but first visit with new cardiologist.;  Notes just fatigue and exercise intolerance.  Also lightheadedness, palpitations and near syncope   Coronary Artery Disease    Single-vessel CAD treated with PCI in setting of ACS.  No active angina.    Patient Profile: .     Bryan Thompson is a mildly obese 73 y.o. male with a PMH reviewed below who presents here for 41-month follow-up to establish new cardiologist.  He presents at the request of Avva, Ravisankar, MD.  Since being a patient in our clinic he has been seen by multiple providers including Rosaline Pavy, PA, Dr. Dann, Dr. Burnard and then Reche Finder, NP.  This is my first time seeing him.  PMH: CAD/non-STEMI (09/29/2022) => LCx PCI Post MI pericarditis treated with colchicine  for 2 of 3 months. HLD-statin intolerance (initial statin was simvastatin-not tolerated, subsequently not able to tolerate atorvastatin  and then rosuvastatin-now on Repatha ) HTN Runs of SVT and PVCs MGUS CKD 2 Depression/anxiety Hypothyroidism-recent adjustment of dosing Questionable history of disruptive sleep followed by Dr. Buck from [facility] neurology  Timofey was seen by Dr. Dann back in August 2024 after his MI.  They noted that he was not able to afford Brilinta  so they switched to Effient .  There was also concern about palpitations and SVT she while on Brilinta -was therefore started on beta-blocker.  He noted fatigue but improving with cardiac rehab.  Thinking that his energy was improving.  He had been started on Repatha  in August.  Patient is wife who is a retired engineer, civil (consulting) was concerned with the prasugrel  and rates his LDL.  She is quite involved in his care.  He was then seen  by Dr. Burnard for 1 visit in May 2025-aspirin  was discontinued due to bruising and he was continued on Effient .  At that time he was on Toprol -XL 12.5 mg daily and on Repatha .  His Synthroid  dose had been reduced to 100 mcg.  There was again concerned about sleep issues.  Recommended follow-up with neurology.  Dr. Burnard who himself was about ready retired had been referred him to see me.     Bryan Thompson was seen in the emergency room on 12/13/2023 by Dr. Palombo for near syncope felt to be orthostasis.  There is lightheadedness worse with standing up.  PVCs noted on monitor but no significant change in heart rate.  He was noted to be orthostatic he was hydrated and discharged home with recommendation to hydrate and follow-up with cardiology.  He was seen by Reche Finder, NP for hospital follow-up on December 16, 2023.  She noted that he was having continued episodes of lightheadedness palpitations but no syncope.  Just coming close to near syncope.  Increasing frequency worse with the hot weather.  Admitted to being a not a good water drinker .  Apparently at that time he was taking his Toprol  2 days on and 1 day off?  => 3 days Zio patch ordered, again encouraged oral hydration.  He is also admitted he missed a couple doses of Repatha  due to URI.  Recommend he continue to try to work on his exercise.  Subjective  Discussed the use of AI scribe software  for clinical note transcription with the patient, who gave verbal consent to proceed.  History of Present Illness Bryan Thompson is a 73 year old male with a history of heart attack and stent placement who presents with lightheadedness and fatigue.  He experiences frequent episodes of lightheadedness, particularly when transitioning from sitting to standing or after physical activity such as walking or bending over. These episodes occur without a consistent pattern and have not resulted in syncope, though he feels close to it at times. He describes  the sensation as 'very lightheaded' and sometimes feels as though his heart might stop. He attempts to manage these episodes by deep breathing and trying to remain calm.  He has a history of a heart attack, during which he experienced pain in his shoulders and neck but not in his chest. He underwent stent placement and was initially placed on Brilinta , which was later switched to Prasugrel  and metoprolol  due to tachycardia spells. Since his metoprolol  dosage was altered in May to be taken every other day, his lightheadedness has worsened. He recently resumed taking metoprolol  daily to see if it helps.  He experiences significant fatigue and breathlessness, especially during physical exertion. He attributes some of this to his age and past heart attack. He also reports irregular heartbeats, which he does not always feel, but his spouse can hear them with a stethoscope. He has not experienced chest pain since his heart attack.  He is currently taking metoprolol  12.5 mg once daily, Prasugrel , and Repatha . His metoprolol  dosage was previously adjusted to be taken every other day, but he has returned to daily dosing. He also mentions a sensitivity to medications, noting that even Tylenol  can make him sleep all night.  He has a history of thyroid  issues, with Synthroid  dosage adjustments made in the past. He is taking Repatha  for cholesterol management. He reports tingling and burning in his feet, especially at night, which has been present for three years without worsening. No chest pain, swelling in the legs, or shortness of breath at night.   Cardiovascular ROS: positive for - dyspnea on exertion, irregular heartbeat, palpitations, rapid heart rate, and a sense of his heart stopped.  Says he just does not feel right; no prolonged episodes of irregular heartbeats.  Relatively short-lived. negative for - edema, orthopnea, paroxysmal nocturnal dyspnea, shortness of breath, or further episodes of syncope but  does have some near syncope.  No TIA/amaurosis fugax or claudication.  No melena, hematochezia, hematuria or epistaxis.  ROS:  Review of Systems - Negative except symptoms noted above.  Despite all these complaints of fatigue and palpitations and near syncope, in his PCPs note he indicated that he is aggressive with yard work and mowing and other housework.  Did not have any complaints of chest pain or dyspnea.  He did however note symptoms suggestive of depression with anhedonia and a sense of generalized malaise and not feeling well.  He voiced concerns about possible medical patient side effects.  Noted some constipation issues .  Dr. Monia note did mention suggestion about possible panic attacks with his feeling unusual sensations in his heart.  This note indicated that he was on buspirone/BuSpar however I do not have that listed on his med list.  The plan of had been to increase his sertraline  up to 100 mg but he decided to take the BuSpar that he had at home twice a day     Objective   Medications: Toprol  XL 12.5 mg taking 2 days  then skipping a day Prasugrel  10 mg daily Repatha  140 mg every 2 weeks Sertraline  50 mg daily (they were notified that there was a interaction between beta-blocker/metoprolol  and sertraline ) Synthroid  88 mcg daily-reduced from 100 mg   Studies Reviewed: SABRA   EKG Interpretation Date/Time:  Friday March 30 2024 12:31:45 EST Ventricular Rate:  58 PR Interval:  192 QRS Duration:  100 QT Interval:  436 QTC Calculation: 428 R Axis:   -12  Text Interpretation: Sinus bradycardia When compared with ECG of 13-Dec-2023 22:35, Premature ventricular complexes in trigeminy NO LONGER PRESENT Confirmed by Anner Lenis ([id number]) on 03/30/2024 12:46:13 PM    Lab Results  Component Value Date   CHOL 181 12/16/2022   HDL 29 (L) 12/16/2022   LDLCALC 121 (H) 12/16/2022   TRIG 173 (H) 12/16/2022   CHOLHDL 6.2 (H) 12/16/2022   Results LABS Cholesterol: Total  cholesterol 107, triglycerides 151, HDL 37, LDL 40; Hemoglobin: 14.3; Platelets: 237(03/12/2024) Cholesterol: Total cholesterol 115, triglycerides 177, HDL 35, LDL 45; A1c 6.1 (10/2023)  DIAGNOSTIC ECHO: EF 50-55%. Severe HK of inferolateral wall. Gr 1 DD. AoV sclerosis (09/29/2022) CATH-PCI: CULPRIT LESION for ACS 99% thrombotic lesion mid-distal LCx--thrombectomy followed by DES PCI with 2.25 x 20 mm Synergy XD postdilated 2.7.  Proximal to mid LAD 30%.  40% ostial RI, 30% proximal RCA and 20% mid RCA.  LVEDP 28 millimercury.  (09/29/2022)   Zio Patch MONITOR: July 2025 Patient had a min HR of 49 bpm, max HR of 99 bpm, and avg HR of 62 bpm. Predominant underlying rhythm was Sinus Rhythm. First Degree AV Block was present. Isolated SVEs were occasional (2.2%, 5923), SVE Couplets were rare (<1.0%, 28), and no SVE Triplets  were present. Isolated VEs were occasional (1.6%, 4372), VE Couplets were rare (<1.0%, 13), and no VE Triplets were present. Ventricular Bigeminy and Trigeminy were present.    Risk Assessment/Calculations:     HYPERTENSION CONTROL Vitals:   03/30/24 1213 03/30/24 1314  BP: (!) 150/80 (!) 145/78    The patient's blood pressure is elevated above target today.  In order to address the patient's elevated BP: The blood pressure is usually elevated in clinic.  Blood pressures monitored at home have been optimal.         Physical Exam:   VS:  BP (!) 145/78   Ht 5' 10 (1.778 m)   Wt 220 lb 3.2 oz (99.9 kg)   BMI 31.60 kg/m    Wt Readings from Last 3 Encounters:  03/30/24 220 lb 3.2 oz (99.9 kg)  12/16/23 215 lb 3.2 oz (97.6 kg)  12/13/23 215 lb (97.5 kg)    Physical Exam VITALS: BP- 150/80 MUSCULOSKELETAL: Heart normal. PULMONARY: Lungs are clear to auscultation bilaterally.   GEN: Well nourished, well groomed; in no acute distress; mildly obese.  Mood seems to be down. NECK: No JVD; No carotid bruits CARDIAC: Normal S1, S2; RRR with occasional ectopy.,  Soft  1/6 SEM at RUSB but otherwise no murmurs, rubs, gallops RESPIRATORY:  Clear to auscultation without rales, wheezing or rhonchi ; nonlabored, good air movement. ABDOMEN: Soft, non-tender, non-distended EXTREMITIES: Trivial edema; No deformity      ASSESSMENT AND PLAN: .    Problem List Items Addressed This Visit       Cardiology Problems   ACS (acute coronary syndrome) (HCC) (Chronic)   ACS/non-STEMI in May 2024 with thrombotic LCx lesion treated with DES PCI.  Unfortunately since this episode he seems to have never really fully thrived.  Has multiple complaints of fatigue, exercise intolerance and there was issues with anticoagulation and as well as antiplatelets.  Thankfully, he is not actively having any anginal symptoms and his EF was preserved on echo as well as LV gram.  No heart failure symptoms       Relevant Medications   nitroGLYCERIN  (NITROSTAT ) 0.4 MG SL tablet   CAD S/P DES PCI to LCx (Chronic)   Status post stent placement, currently on Effient -but with multiple concerns of bruising and side effect possibilities/concerns.  Normal EF but no heart failure symptoms and no angina. - Switched from Effient  to Plavix 75 mg daily for stent maintenance. - Continue Repatha  therapy => statin intolerant - Because of side effects, we will wean off of Toprol  and see how he does.-EXTR significant fatigue and general sense of not feeling well..      Relevant Medications   nitroGLYCERIN  (NITROSTAT ) 0.4 MG SL tablet   Essential hypertension (Chronic)   Variable blood pressure readings. Metoprolol  may not significantly impact blood pressure. - Discontinue metoprolol  by taking it every other day for two weeks, then stop. - Monitor blood pressure and consider alternative antihypertensive therapy if needed. => Would potentially consider diltiazem which would help out with his SVT episodes and PVCs.      Relevant Medications   nitroGLYCERIN  (NITROSTAT ) 0.4 MG SL tablet   Hyperlipidemia  with target low density lipoprotein (LDL) cholesterol less than 55 mg/dL (Chronic)   Hyperlipidemia on Repatha  therapy => intolerant due to myopathy with simvastatin, atorvastatin  and rosuvastatin.  Not even able to tolerate low doses. Hyperlipidemia well-controlled on Repatha . LDL is 45, below goal. - Continue Repatha  therapy.      Relevant Medications   nitroGLYCERIN  (NITROSTAT ) 0.4 MG SL tablet   PVC's (premature ventricular contractions) (Chronic)   Thankfully, his EKG today did not show any PVCs.  But what he is describing a feeling his heart normal stopping intermittently sounds like he may be having PVCs potentially in bigeminy or and couplets.  Monitor is relatively reassuring. Intermittent palpitations with PVCs and PACs. No significant rhythm abnormalities on recent monitoring. Symptoms may be medication-related. - Discontinue metoprolol  by taking it every other day for two weeks, then stop. - Monitor for changes in palpitations and heart rate.      Relevant Medications   nitroGLYCERIN  (NITROSTAT ) 0.4 MG SL tablet   SVT (supraventricular tachycardia) (Chronic)   No episodes seen on monitor.  Likely has short little bursts of SVT.  For this reason I would be reluctant to stop beta-blocker, however at this point, since all of these episodes are benign, I think we can stop his metoprolol  previously weaning it off over the next several weeks to see how her symptoms do.  Maybe this will help his fatigue.  I think adequate hydration and maybe PRN short acting diltiazem for SVT episodes would be a reasonable option.      Relevant Medications   nitroGLYCERIN  (NITROSTAT ) 0.4 MG SL tablet     Other   Fatigue due to depression   Fatigue and lightheadedness possibly related to medication, however I think the key portion can be related to depression..  Symptoms may be exacerbated by dehydration. - Discontinue metoprolol  by taking it every other day for two weeks, then stop. - Discontinue  Effient  and start Plavix. - Monitor symptoms and blood pressure.      Monoclonal gammopathies   Quite likely this is contributing somewhat to his fatigue but I do not think that he has  been anemic.      Myalgia due to statin (Chronic)   Intolerant of multiple different statins.  Even low-dose of rosuvastatin was not tolerable.  Doing well with Repatha .      Peripheral neuropathy   Peripheral neuropathy symptoms (tingling, burning feet) Chronic tingling and burning in feet consistent with peripheral neuropathy Recommend discussing with either PCP or neurology.      Other Visit Diagnoses       Coronary artery disease involving native coronary artery of native heart without angina pectoris    -  Primary   Relevant Medications   nitroGLYCERIN  (NITROSTAT ) 0.4 MG SL tablet   Other Relevant Orders   EKG 12-Lead (Completed)     Palpitations       Relevant Orders   EKG 12-Lead (Completed)             Follow-Up: Return in about 3 months (around 06/30/2024) for Routine follow up with me, [employer].  I spent 58 minutes in the care of Bryan Thompson today including reviewing labs (1 minute), reviewing outside labs from [facility] (1 minute), reviewing studies (echocardiogram report reviewed, Zio patch report, and cath Films reviewed-7 minutes minutes), face to face time discussing treatment options (26 minutes), reviewing records from his admission for ACS in May 2025, clinic notes from Dr. Dann, Dr. Burnard and Reche Finder, NP as well as ER visit notes, and PCP note (12 minutes), 13 minutes dictating, chart update, and documenting in the encounter.      Signed, Alm MICAEL Clay, MD, MS Alm Clay, M.D., M.S. Interventional Cardiologist  [facility] Pager # [phone]

## 2024-03-30 NOTE — Patient Instructions (Signed)
 Medication Instructions:   Stop taking  Effient   Start taking Clopidogrel ( Plavix)  75 mg daily     Metoprolol   succinate 12.5 mg take every other day for 2 weeks the stop completely  *If you need a refill on your cardiac medications before your next appointment, please call your pharmacy*   Lab Work: Not  needed If you have labs (blood work) drawn today and your tests are completely normal, you will receive your results only by: MyChart Message (if you have MyChart) OR A paper copy in the mail If you have any lab test that is abnormal or we need to change your treatment, we will call you to review the results.   Testing/Procedures: Not needed   Follow-Up: At [facility], you and your health needs are our priority.  As part of our continuing mission to provide you with exceptional heart care, we have created designated Provider Care Teams.  These Care Teams include your primary Cardiologist (physician) and Advanced Practice Providers (APPs -  Physician Assistants and Nurse Practitioners) who all work together to provide you with the care you need, when you need it.     Your next appointment:   3 month(s)  The format for your next appointment:   In Person  Provider:   Alm Clay, MD   Other Instructions

## 2024-03-31 ENCOUNTER — Encounter: Payer: Self-pay | Admitting: Cardiology

## 2024-03-31 DIAGNOSIS — I493 Ventricular premature depolarization: Secondary | ICD-10-CM | POA: Insufficient documentation

## 2024-03-31 DIAGNOSIS — G629 Polyneuropathy, unspecified: Secondary | ICD-10-CM | POA: Insufficient documentation

## 2024-03-31 NOTE — Assessment & Plan Note (Signed)
 Fatigue and lightheadedness possibly related to medication, however I think the key portion can be related to depression..  Symptoms may be exacerbated by dehydration. - Discontinue metoprolol  by taking it every other day for two weeks, then stop. - Discontinue Effient  and start Plavix. - Monitor symptoms and blood pressure.

## 2024-03-31 NOTE — Assessment & Plan Note (Signed)
 Hyperlipidemia on Repatha  therapy => intolerant due to myopathy with simvastatin, atorvastatin  and rosuvastatin.  Not even able to tolerate low doses. Hyperlipidemia well-controlled on Repatha . LDL is 45, below goal. - Continue Repatha  therapy.

## 2024-03-31 NOTE — Assessment & Plan Note (Signed)
 Intolerant of multiple different statins.  Even low-dose of rosuvastatin was not tolerable.  Doing well with Repatha .

## 2024-03-31 NOTE — Assessment & Plan Note (Signed)
 No episodes seen on monitor.  Likely has short little bursts of SVT.  For this reason I would be reluctant to stop beta-blocker, however at this point, since all of these episodes are benign, I think we can stop his metoprolol  previously weaning it off over the next several weeks to see how her symptoms do.  Maybe this will help his fatigue.  I think adequate hydration and maybe PRN short acting diltiazem for SVT episodes would be a reasonable option.

## 2024-03-31 NOTE — Assessment & Plan Note (Signed)
 Thankfully, his EKG today did not show any PVCs.  But what he is describing a feeling his heart normal stopping intermittently sounds like he may be having PVCs potentially in bigeminy or and couplets.  Monitor is relatively reassuring. Intermittent palpitations with PVCs and PACs. No significant rhythm abnormalities on recent monitoring. Symptoms may be medication-related. - Discontinue metoprolol  by taking it every other day for two weeks, then stop. - Monitor for changes in palpitations and heart rate.

## 2024-03-31 NOTE — Assessment & Plan Note (Signed)
 Status post stent placement, currently on Effient -but with multiple concerns of bruising and side effect possibilities/concerns.  Normal EF but no heart failure symptoms and no angina. - Switched from Effient  to Plavix 75 mg daily for stent maintenance. - Continue Repatha  therapy => statin intolerant - Because of side effects, we will wean off of Toprol  and see how he does.-EXTR significant fatigue and general sense of not feeling well.SABRA

## 2024-03-31 NOTE — Assessment & Plan Note (Signed)
 Quite likely this is contributing somewhat to his fatigue but I do not think that he has been anemic.

## 2024-03-31 NOTE — Assessment & Plan Note (Signed)
 Peripheral neuropathy symptoms (tingling, burning feet) Chronic tingling and burning in feet consistent with peripheral neuropathy Recommend discussing with either PCP or neurology.

## 2024-03-31 NOTE — Assessment & Plan Note (Signed)
 Variable blood pressure readings. Metoprolol  may not significantly impact blood pressure. - Discontinue metoprolol  by taking it every other day for two weeks, then stop. - Monitor blood pressure and consider alternative antihypertensive therapy if needed. => Would potentially consider diltiazem which would help out with his SVT episodes and PVCs.

## 2024-03-31 NOTE — Assessment & Plan Note (Signed)
 ACS/non-STEMI in May 2024 with thrombotic LCx lesion treated with DES PCI.  Unfortunately since this episode he seems to have never really fully thrived.  Has multiple complaints of fatigue, exercise intolerance and there was issues with anticoagulation and as well as antiplatelets.  Thankfully, he is not actively having any anginal symptoms and his EF was preserved on echo as well as LV gram.  No heart failure symptoms

## 2024-04-23 ENCOUNTER — Ambulatory Visit (HOSPITAL_BASED_OUTPATIENT_CLINIC_OR_DEPARTMENT_OTHER): Payer: Self-pay | Admitting: Family

## 2024-04-23 DIAGNOSIS — I25118 Atherosclerotic heart disease of native coronary artery with other forms of angina pectoris: Secondary | ICD-10-CM | POA: Diagnosis not present

## 2024-04-23 DIAGNOSIS — R002 Palpitations: Secondary | ICD-10-CM

## 2024-04-25 ENCOUNTER — Ambulatory Visit: Admitting: Cardiology

## 2024-07-31 ENCOUNTER — Ambulatory Visit: Admitting: Cardiology

## 2024-08-15 ENCOUNTER — Inpatient Hospital Stay

## 2024-08-29 ENCOUNTER — Inpatient Hospital Stay: Admitting: Physician Assistant
